# Patient Record
Sex: Female | Born: 1975
Health system: Southern US, Community
[De-identification: ages and names within clinical notes are randomized; demographics above are authoritative.]

## PROBLEM LIST (undated history)

## (undated) DIAGNOSIS — N83201 Unspecified ovarian cyst, right side: Secondary | ICD-10-CM

## (undated) DIAGNOSIS — J209 Acute bronchitis, unspecified: Secondary | ICD-10-CM

## (undated) DIAGNOSIS — G43909 Migraine, unspecified, not intractable, without status migrainosus: Secondary | ICD-10-CM

## (undated) DIAGNOSIS — Z9289 Personal history of other medical treatment: Secondary | ICD-10-CM

## (undated) DIAGNOSIS — F3131 Bipolar disorder, current episode depressed, mild: Secondary | ICD-10-CM

## (undated) DIAGNOSIS — N83202 Unspecified ovarian cyst, left side: Secondary | ICD-10-CM

## (undated) DIAGNOSIS — I10 Essential (primary) hypertension: Secondary | ICD-10-CM

## (undated) DIAGNOSIS — D649 Anemia, unspecified: Secondary | ICD-10-CM

## (undated) DIAGNOSIS — O139 Gestational [pregnancy-induced] hypertension without significant proteinuria, unspecified trimester: Secondary | ICD-10-CM

## (undated) DIAGNOSIS — K219 Gastro-esophageal reflux disease without esophagitis: Secondary | ICD-10-CM

## (undated) DIAGNOSIS — D219 Benign neoplasm of connective and other soft tissue, unspecified: Secondary | ICD-10-CM

## (undated) DIAGNOSIS — R569 Unspecified convulsions: Secondary | ICD-10-CM

## (undated) HISTORY — PX: INDUCED ABORTION: SHX677

## (undated) HISTORY — PX: TUBAL LIGATION: SHX77

## (undated) HISTORY — DX: Benign neoplasm of connective and other soft tissue, unspecified: D21.9

## (undated) HISTORY — DX: Acute bronchitis, unspecified: J20.9

---
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
## 1998-06-08 ENCOUNTER — Emergency Department (HOSPITAL_COMMUNITY): Admission: EM | Admit: 1998-06-08 | Discharge: 1998-06-08 | Payer: Self-pay | Admitting: Emergency Medicine

## 1998-06-12 ENCOUNTER — Emergency Department (HOSPITAL_COMMUNITY): Admission: EM | Admit: 1998-06-12 | Discharge: 1998-06-12 | Payer: Self-pay | Admitting: *Deleted

## 1998-12-17 ENCOUNTER — Emergency Department (HOSPITAL_COMMUNITY): Admission: EM | Admit: 1998-12-17 | Discharge: 1998-12-17 | Payer: Self-pay

## 1999-06-06 ENCOUNTER — Emergency Department (HOSPITAL_COMMUNITY): Admission: EM | Admit: 1999-06-06 | Discharge: 1999-06-06 | Payer: Self-pay | Admitting: Emergency Medicine

## 1999-08-13 ENCOUNTER — Emergency Department (HOSPITAL_COMMUNITY): Admission: EM | Admit: 1999-08-13 | Discharge: 1999-08-14 | Payer: Self-pay | Admitting: Emergency Medicine

## 1999-08-13 ENCOUNTER — Encounter: Payer: Self-pay | Admitting: Emergency Medicine

## 2000-01-18 ENCOUNTER — Emergency Department (HOSPITAL_COMMUNITY): Admission: EM | Admit: 2000-01-18 | Discharge: 2000-01-18 | Payer: Self-pay | Admitting: Emergency Medicine

## 2000-01-18 ENCOUNTER — Encounter: Payer: Self-pay | Admitting: *Deleted

## 2000-01-20 ENCOUNTER — Emergency Department (HOSPITAL_COMMUNITY): Admission: EM | Admit: 2000-01-20 | Discharge: 2000-01-21 | Payer: Self-pay | Admitting: Emergency Medicine

## 2001-05-17 ENCOUNTER — Encounter: Admission: RE | Admit: 2001-05-17 | Discharge: 2001-05-17 | Payer: Self-pay | Admitting: General Practice

## 2001-05-17 ENCOUNTER — Encounter: Payer: Self-pay | Admitting: General Practice

## 2002-04-09 ENCOUNTER — Other Ambulatory Visit: Admission: RE | Admit: 2002-04-09 | Discharge: 2002-04-09 | Payer: Self-pay | Admitting: Obstetrics and Gynecology

## 2002-07-15 ENCOUNTER — Encounter: Admission: RE | Admit: 2002-07-15 | Discharge: 2002-07-15 | Payer: Self-pay | Admitting: General Practice

## 2002-07-15 ENCOUNTER — Encounter: Payer: Self-pay | Admitting: General Practice

## 2003-12-21 ENCOUNTER — Emergency Department (HOSPITAL_COMMUNITY): Admission: EM | Admit: 2003-12-21 | Discharge: 2003-12-21 | Payer: Self-pay | Admitting: Emergency Medicine

## 2004-12-01 ENCOUNTER — Inpatient Hospital Stay (HOSPITAL_COMMUNITY): Admission: AD | Admit: 2004-12-01 | Discharge: 2004-12-02 | Payer: Self-pay | Admitting: Obstetrics and Gynecology

## 2005-05-29 ENCOUNTER — Inpatient Hospital Stay (HOSPITAL_COMMUNITY): Admission: AD | Admit: 2005-05-29 | Discharge: 2005-05-29 | Payer: Self-pay | Admitting: *Deleted

## 2005-12-20 ENCOUNTER — Inpatient Hospital Stay (HOSPITAL_COMMUNITY): Admission: AD | Admit: 2005-12-20 | Discharge: 2005-12-20 | Payer: Self-pay | Admitting: *Deleted

## 2006-02-03 ENCOUNTER — Emergency Department (HOSPITAL_COMMUNITY): Admission: EM | Admit: 2006-02-03 | Discharge: 2006-02-03 | Payer: Self-pay | Admitting: Emergency Medicine

## 2006-02-06 ENCOUNTER — Emergency Department (HOSPITAL_COMMUNITY): Admission: EM | Admit: 2006-02-06 | Discharge: 2006-02-06 | Payer: Self-pay | Admitting: Emergency Medicine

## 2006-08-05 ENCOUNTER — Emergency Department (HOSPITAL_COMMUNITY): Admission: EM | Admit: 2006-08-05 | Discharge: 2006-08-05 | Payer: Self-pay | Admitting: Emergency Medicine

## 2006-12-21 ENCOUNTER — Emergency Department (HOSPITAL_COMMUNITY): Admission: EM | Admit: 2006-12-21 | Discharge: 2006-12-21 | Payer: Self-pay | Admitting: Emergency Medicine

## 2008-02-22 ENCOUNTER — Emergency Department (HOSPITAL_COMMUNITY): Admission: EM | Admit: 2008-02-22 | Discharge: 2008-02-22 | Payer: Self-pay | Admitting: Emergency Medicine

## 2008-06-05 ENCOUNTER — Emergency Department (HOSPITAL_COMMUNITY): Admission: EM | Admit: 2008-06-05 | Discharge: 2008-06-05 | Payer: Self-pay | Admitting: Emergency Medicine

## 2008-07-04 ENCOUNTER — Emergency Department (HOSPITAL_COMMUNITY): Admission: EM | Admit: 2008-07-04 | Discharge: 2008-07-04 | Payer: Self-pay | Admitting: Emergency Medicine

## 2008-07-16 ENCOUNTER — Emergency Department (HOSPITAL_COMMUNITY): Admission: EM | Admit: 2008-07-16 | Discharge: 2008-07-16 | Payer: Self-pay | Admitting: Emergency Medicine

## 2008-09-04 ENCOUNTER — Emergency Department (HOSPITAL_COMMUNITY): Admission: EM | Admit: 2008-09-04 | Discharge: 2008-09-04 | Payer: Self-pay | Admitting: Emergency Medicine

## 2008-12-22 ENCOUNTER — Emergency Department (HOSPITAL_COMMUNITY): Admission: EM | Admit: 2008-12-22 | Discharge: 2008-12-22 | Payer: Self-pay | Admitting: Emergency Medicine

## 2009-03-03 ENCOUNTER — Emergency Department (HOSPITAL_COMMUNITY): Admission: EM | Admit: 2009-03-03 | Discharge: 2009-03-03 | Payer: Self-pay | Admitting: Cardiology

## 2009-04-22 ENCOUNTER — Ambulatory Visit: Payer: Self-pay | Admitting: Physician Assistant

## 2009-04-22 ENCOUNTER — Inpatient Hospital Stay (HOSPITAL_COMMUNITY): Admission: AD | Admit: 2009-04-22 | Discharge: 2009-04-22 | Payer: Self-pay | Admitting: Obstetrics and Gynecology

## 2009-05-21 ENCOUNTER — Ambulatory Visit (HOSPITAL_COMMUNITY): Admission: RE | Admit: 2009-05-21 | Discharge: 2009-05-21 | Payer: Self-pay | Admitting: Family Medicine

## 2009-05-21 ENCOUNTER — Ambulatory Visit: Payer: Self-pay | Admitting: Obstetrics and Gynecology

## 2009-05-21 ENCOUNTER — Encounter: Payer: Self-pay | Admitting: Obstetrics and Gynecology

## 2009-05-21 LAB — CONVERTED CEMR LAB
HCT: 43.2 % (ref 36.0–46.0)
MCHC: 33.6 g/dL (ref 30.0–36.0)
MCV: 92.7 fL (ref 78.0–100.0)
Platelets: 433 10*3/uL — ABNORMAL HIGH (ref 150–400)
RDW: 12 % (ref 11.5–15.5)

## 2009-05-24 ENCOUNTER — Emergency Department (HOSPITAL_COMMUNITY): Admission: EM | Admit: 2009-05-24 | Discharge: 2009-05-24 | Payer: Self-pay | Admitting: Emergency Medicine

## 2009-07-12 ENCOUNTER — Emergency Department (HOSPITAL_COMMUNITY): Admission: EM | Admit: 2009-07-12 | Discharge: 2009-07-12 | Payer: Self-pay | Admitting: Emergency Medicine

## 2009-08-06 ENCOUNTER — Ambulatory Visit: Payer: Self-pay | Admitting: Obstetrics and Gynecology

## 2009-11-05 ENCOUNTER — Ambulatory Visit: Payer: Self-pay | Admitting: Obstetrics and Gynecology

## 2009-12-24 ENCOUNTER — Ambulatory Visit: Payer: Self-pay | Admitting: Obstetrics and Gynecology

## 2010-02-03 ENCOUNTER — Emergency Department (HOSPITAL_COMMUNITY): Admission: EM | Admit: 2010-02-03 | Discharge: 2010-02-03 | Payer: Self-pay | Admitting: Emergency Medicine

## 2010-06-16 ENCOUNTER — Emergency Department (HOSPITAL_COMMUNITY): Admission: EM | Admit: 2010-06-16 | Discharge: 2010-06-16 | Payer: Self-pay | Admitting: Emergency Medicine

## 2010-09-26 ENCOUNTER — Emergency Department (HOSPITAL_COMMUNITY): Admission: EM | Admit: 2010-09-26 | Discharge: 2010-09-26 | Payer: Self-pay | Admitting: Emergency Medicine

## 2011-01-03 ENCOUNTER — Ambulatory Visit: Admit: 2011-01-03 | Payer: Self-pay | Admitting: Obstetrics & Gynecology

## 2011-01-10 ENCOUNTER — Encounter: Payer: Self-pay | Admitting: *Deleted

## 2011-02-08 ENCOUNTER — Inpatient Hospital Stay (HOSPITAL_COMMUNITY): Payer: Medicaid Other

## 2011-02-08 ENCOUNTER — Inpatient Hospital Stay (HOSPITAL_COMMUNITY)
Admission: AD | Admit: 2011-02-08 | Discharge: 2011-02-08 | Disposition: A | Discharge status: Home / Self Care | Payer: Medicaid Other | Source: Ambulatory Visit | Attending: Obstetrics & Gynecology | Admitting: Obstetrics & Gynecology

## 2011-02-08 DIAGNOSIS — R109 Unspecified abdominal pain: Secondary | ICD-10-CM | POA: Insufficient documentation

## 2011-02-08 DIAGNOSIS — N938 Other specified abnormal uterine and vaginal bleeding: Secondary | ICD-10-CM | POA: Insufficient documentation

## 2011-02-08 DIAGNOSIS — N949 Unspecified condition associated with female genital organs and menstrual cycle: Secondary | ICD-10-CM | POA: Insufficient documentation

## 2011-02-08 LAB — URINALYSIS, ROUTINE W REFLEX MICROSCOPIC
Bilirubin Urine: NEGATIVE
Leukocytes, UA: NEGATIVE
Nitrite: NEGATIVE

## 2011-02-08 LAB — CBC
HCT: 38.4 % (ref 36.0–46.0)
Hemoglobin: 12.8 g/dL (ref 12.0–15.0)
MCH: 31.6 pg (ref 26.0–34.0)
MCHC: 33.3 g/dL (ref 30.0–36.0)
MCV: 94.8 fL (ref 78.0–100.0)

## 2011-02-08 LAB — URINE MICROSCOPIC-ADD ON

## 2011-02-08 LAB — WET PREP, GENITAL

## 2011-02-09 LAB — GC/CHLAMYDIA PROBE AMP, GENITAL: GC Probe Amp, Genital: NEGATIVE

## 2011-02-24 ENCOUNTER — Ambulatory Visit: Payer: Self-pay | Admitting: Obstetrics & Gynecology

## 2011-03-03 LAB — DIFFERENTIAL
Basophils Relative: 1 % (ref 0–1)
Eosinophils Absolute: 0.5 10*3/uL (ref 0.0–0.7)
Monocytes Relative: 7 % (ref 3–12)
Neutrophils Relative %: 60 % (ref 43–77)

## 2011-03-03 LAB — CBC
Hemoglobin: 13.7 g/dL (ref 12.0–15.0)
MCH: 32.8 pg (ref 26.0–34.0)
MCHC: 34.1 g/dL (ref 30.0–36.0)
RDW: 12.6 % (ref 11.5–15.5)

## 2011-03-03 LAB — POCT I-STAT, CHEM 8
Calcium, Ion: 1.13 mmol/L (ref 1.12–1.32)
Glucose, Bld: 84 mg/dL (ref 70–99)
HCT: 43 % (ref 36.0–46.0)
Hemoglobin: 14.6 g/dL (ref 12.0–15.0)
Potassium: 3.9 mEq/L (ref 3.5–5.1)

## 2011-03-16 ENCOUNTER — Inpatient Hospital Stay (HOSPITAL_COMMUNITY): Payer: Medicaid Other

## 2011-03-16 ENCOUNTER — Inpatient Hospital Stay (HOSPITAL_COMMUNITY)
Admission: AD | Admit: 2011-03-16 | Discharge: 2011-03-16 | Disposition: A | Discharge status: Home / Self Care | Payer: Medicaid Other | Source: Ambulatory Visit | Attending: Obstetrics and Gynecology | Admitting: Obstetrics and Gynecology

## 2011-03-16 DIAGNOSIS — R109 Unspecified abdominal pain: Secondary | ICD-10-CM | POA: Insufficient documentation

## 2011-03-16 DIAGNOSIS — N949 Unspecified condition associated with female genital organs and menstrual cycle: Secondary | ICD-10-CM

## 2011-03-16 DIAGNOSIS — N390 Urinary tract infection, site not specified: Secondary | ICD-10-CM

## 2011-03-16 DIAGNOSIS — R52 Pain, unspecified: Secondary | ICD-10-CM

## 2011-03-16 DIAGNOSIS — N938 Other specified abnormal uterine and vaginal bleeding: Secondary | ICD-10-CM | POA: Insufficient documentation

## 2011-03-16 LAB — CBC
Hemoglobin: 14.1 g/dL (ref 12.0–15.0)
MCH: 32 pg (ref 26.0–34.0)
MCV: 94.3 fL (ref 78.0–100.0)
RBC: 4.4 MIL/uL (ref 3.87–5.11)

## 2011-03-16 LAB — DIFFERENTIAL
Lymphocytes Relative: 15 % (ref 12–46)
Lymphs Abs: 1 10*3/uL (ref 0.7–4.0)
Monocytes Relative: 1 % — ABNORMAL LOW (ref 3–12)
Neutro Abs: 5.2 10*3/uL (ref 1.7–7.7)
Neutrophils Relative %: 83 % — ABNORMAL HIGH (ref 43–77)

## 2011-03-16 LAB — URINALYSIS, ROUTINE W REFLEX MICROSCOPIC
Protein, ur: >300 mg/dL — AB
Urobilinogen, UA: 2 mg/dL — ABNORMAL HIGH (ref 0.0–1.0)

## 2011-03-17 LAB — URINE CULTURE: Colony Count: 15000

## 2011-03-28 ENCOUNTER — Encounter: Payer: Self-pay | Admitting: Obstetrics & Gynecology

## 2011-03-29 LAB — WET PREP, GENITAL
Trich, Wet Prep: NONE SEEN
Yeast Wet Prep HPF POC: NONE SEEN

## 2011-03-31 LAB — URINALYSIS, ROUTINE W REFLEX MICROSCOPIC
Bilirubin Urine: NEGATIVE
Glucose, UA: NEGATIVE mg/dL
Hgb urine dipstick: NEGATIVE
Protein, ur: NEGATIVE mg/dL
Urobilinogen, UA: 0.2 mg/dL (ref 0.0–1.0)

## 2011-03-31 LAB — DIFFERENTIAL
Basophils Absolute: 0 10*3/uL (ref 0.0–0.1)
Basophils Relative: 1 % (ref 0–1)
Eosinophils Absolute: 0.6 10*3/uL (ref 0.0–0.7)
Eosinophils Relative: 8 % — ABNORMAL HIGH (ref 0–5)
Monocytes Absolute: 0.5 10*3/uL (ref 0.1–1.0)
Neutro Abs: 3.9 10*3/uL (ref 1.7–7.7)

## 2011-03-31 LAB — BASIC METABOLIC PANEL
CO2: 26 mEq/L (ref 19–32)
Calcium: 9.5 mg/dL (ref 8.4–10.5)
Chloride: 106 mEq/L (ref 96–112)
Glucose, Bld: 90 mg/dL (ref 70–99)
Sodium: 138 mEq/L (ref 135–145)

## 2011-03-31 LAB — CBC
Hemoglobin: 14.4 g/dL (ref 12.0–15.0)
MCHC: 33.5 g/dL (ref 30.0–36.0)
MCV: 96.6 fL (ref 78.0–100.0)
RDW: 12.7 % (ref 11.5–15.5)

## 2011-05-03 NOTE — Group Therapy Note (Signed)
[name], [name] NO.:  [id number]   MEDICAL RECORD NO.:  [id number]          PATIENT TYPE:  WOC   LOCATION:  [facility]                   FACILITY:  [facility]   PHYSICIAN:  Argentina Donovan, MD        DATE OF BIRTH:  1976-10-13   DATE OF SERVICE:  05/21/2009                                  CLINIC NOTE   The patient is a 35 year old African American female gravida 2, para 2-0-  0-2 who had 2 cesarean sections in 1994 and 1996 and had a tubal  ligation following that.  Prior to her tubal ligation, she was using  Depo-Provera and oral contraceptives.  Since her tubal ligation, her  periods have gotten much heavier and over the last 2 years much more  uncomfortable with severe lower abdominal pain radiating to the back,  upper thighs, and not helped much by ibuprofen.  When she does take it,  there is a problem because she has started to have stomach problems from  it.  They had tried her on oral contraceptives without success.  I have  told her what we would start off doing, as she sounds like she probably  has some adenomyosis and see if we can control this.  One, trying her on  some Depo-Provera.  If that does not work, we can try an endometrial  ablation.  We talked to her about the NovaSure and gave her information  on that and thirdly if that does not work, hysterectomy is last resort.  She is going to get a followup in mid July on an ultrasound because the  last one she had done a few days ago showed a small complex cyst and  they requested a followup on that.  It looks like, however, a small  hemorrhagic functional cyst.  The patient, otherwise, is in good health.  The patient had a Pap smear in 2 years, so that will be done today.   MEDICATION:  She is taking lisinopril and Ventolin.   She has no known allergies.   PHYSICAL EXAMINATION:  Today, her blood pressure is 112/77, her weight  is 166.  She is 60 inches tall.  Well-developed and slightly obese  African American female.  Abdomen is soft and nontender.  No masses or  organomegaly.  External genitalia is normal.  BUS within normal limits.  Vagina is clean and well rugated.  Cervix, clean, parous.  Uterus,  anterior normal size, shape, and consistency.   IMPRESSION:  Normal physical examination with menorrhagia and severe  dysmenorrhea, probable adenomyosis.   PLAN:  Depo-Provera, if not successful endometrial ablation, if not  successful hysterectomy.  CBC will be obtained to make sure that she is  not getting anemic from a lot of bleeding she is doing.           ______________________________  Argentina Donovan, MD     PR/MEDQ  D:  05/21/2009  T:  05/22/2009  Job:  [id number]

## 2011-06-23 ENCOUNTER — Emergency Department (HOSPITAL_COMMUNITY): Payer: Medicaid Other

## 2011-06-23 ENCOUNTER — Emergency Department (HOSPITAL_COMMUNITY)
Admission: EM | Admit: 2011-06-23 | Discharge: 2011-06-24 | Disposition: A | Discharge status: Home / Self Care | Payer: Medicaid Other | Attending: Emergency Medicine | Admitting: Emergency Medicine

## 2011-06-23 DIAGNOSIS — K649 Unspecified hemorrhoids: Secondary | ICD-10-CM | POA: Insufficient documentation

## 2011-06-23 DIAGNOSIS — Z79899 Other long term (current) drug therapy: Secondary | ICD-10-CM | POA: Insufficient documentation

## 2011-06-23 DIAGNOSIS — G44209 Tension-type headache, unspecified, not intractable: Secondary | ICD-10-CM | POA: Insufficient documentation

## 2011-06-23 LAB — POCT PREGNANCY, URINE: Preg Test, Ur: NEGATIVE

## 2011-06-23 LAB — CBC
MCH: 32.8 pg (ref 26.0–34.0)
MCHC: 35.2 g/dL (ref 30.0–36.0)
MCV: 93.1 fL (ref 78.0–100.0)
Platelets: 324 10*3/uL (ref 150–400)

## 2011-06-23 LAB — DIFFERENTIAL
Basophils Relative: 1 % (ref 0–1)
Eosinophils Absolute: 0.7 10*3/uL (ref 0.0–0.7)
Eosinophils Relative: 10 % — ABNORMAL HIGH (ref 0–5)
Lymphs Abs: 4 10*3/uL (ref 0.7–4.0)
Monocytes Absolute: 0.5 10*3/uL (ref 0.1–1.0)
Monocytes Relative: 6 % (ref 3–12)

## 2011-06-23 LAB — BASIC METABOLIC PANEL
CO2: 26 mEq/L (ref 19–32)
Calcium: 9.9 mg/dL (ref 8.4–10.5)
Creatinine, Ser: 0.71 mg/dL (ref 0.50–1.10)
GFR calc non Af Amer: >60 mL/min (ref >60)
Sodium: 138 mEq/L (ref 135–145)

## 2011-06-24 LAB — URINALYSIS, ROUTINE W REFLEX MICROSCOPIC
Bilirubin Urine: NEGATIVE
Glucose, UA: NEGATIVE mg/dL
Ketones, ur: NEGATIVE mg/dL
Leukocytes, UA: NEGATIVE
Protein, ur: NEGATIVE mg/dL
pH: 6 (ref 5.0–8.0)

## 2011-07-08 ENCOUNTER — Emergency Department (HOSPITAL_COMMUNITY)
Admission: EM | Admit: 2011-07-08 | Discharge: 2011-07-08 | Disposition: A | Discharge status: Home / Self Care | Payer: Medicaid Other | Attending: Emergency Medicine | Admitting: Emergency Medicine

## 2011-07-08 DIAGNOSIS — R0602 Shortness of breath: Secondary | ICD-10-CM | POA: Insufficient documentation

## 2011-07-08 DIAGNOSIS — J45909 Unspecified asthma, uncomplicated: Secondary | ICD-10-CM | POA: Insufficient documentation

## 2011-07-23 ENCOUNTER — Inpatient Hospital Stay (HOSPITAL_COMMUNITY)
Admission: AD | Admit: 2011-07-23 | Discharge: 2011-07-23 | Disposition: A | Discharge status: Home / Self Care | Payer: Medicaid Other | Source: Ambulatory Visit | Attending: Obstetrics & Gynecology | Admitting: Obstetrics & Gynecology

## 2011-07-23 ENCOUNTER — Encounter (HOSPITAL_COMMUNITY): Payer: Self-pay | Admitting: *Deleted

## 2011-07-23 DIAGNOSIS — R109 Unspecified abdominal pain: Secondary | ICD-10-CM | POA: Insufficient documentation

## 2011-07-23 HISTORY — DX: Bipolar disorder, current episode depressed, mild: F31.31

## 2011-07-23 HISTORY — DX: Unspecified ovarian cyst, left side: N83.202

## 2011-07-23 HISTORY — DX: Gestational (pregnancy-induced) hypertension without significant proteinuria, unspecified trimester: O13.9

## 2011-07-23 HISTORY — DX: Gastro-esophageal reflux disease without esophagitis: K21.9

## 2011-07-23 HISTORY — DX: Essential (primary) hypertension: I10

## 2011-07-23 HISTORY — DX: Migraine, unspecified, not intractable, without status migrainosus: G43.909

## 2011-07-23 HISTORY — DX: Unspecified ovarian cyst, right side: N83.201

## 2011-07-23 LAB — URINALYSIS, ROUTINE W REFLEX MICROSCOPIC
Glucose, UA: NEGATIVE mg/dL
Ketones, ur: NEGATIVE mg/dL
Leukocytes, UA: NEGATIVE
Protein, ur: NEGATIVE mg/dL
Urobilinogen, UA: 0.2 mg/dL (ref 0.0–1.0)

## 2011-07-23 LAB — WET PREP, GENITAL: Yeast Wet Prep HPF POC: NONE SEEN

## 2011-07-23 NOTE — Progress Notes (Signed)
Two wks ago pt noticed a spot of blood in her partner's semen.  Noticed it again last night, has been having "cervical cramping" for 2 wks.  Partner has not been tested for STD.

## 2011-07-23 NOTE — Progress Notes (Signed)
Pt states, " Last week there was blood in my boyfriends semen, and I am worried that he may have an STD and given it to me because my stomache feels bloated and has butterflies."

## 2011-07-23 NOTE — Progress Notes (Signed)
Pt added that she has had frequency on urination X 3-4 days with mild cramping in low abdomen."

## 2011-07-23 NOTE — Progress Notes (Signed)
By Lilyan Punt NP

## 2011-07-23 NOTE — ED Provider Notes (Signed)
History     Chief Complaint  Patient presents with  . Bloated  . Abdominal Cramping  . Dysuria   HPI 35 y.o.  Having fluttering feeling in abdomen.  Noticed a drop of blood in partner's semen when he pulled out last night.  Since she has been feeling strange, wanted to be checked for STDs.  Having some cramping and thought menses was coming early but has not started menses.  Thought she might have a UTI.    OB History    Grav Para Term Preterm Abortions TAB SAB Ect Mult Living   3 2 1 1 1 1    2       Past Medical History  Diagnosis Date  . Bipolar 1 disorder, depressed, mild   . Migraines   . GERD (gastroesophageal reflux disease)   . Bilateral ovarian cysts   . Asthma   . Pregnancy induced hypertension   . HTN (hypertension)     Past Surgical History  Procedure Date  . Cesarean section   . Induced abortion   . Tubal ligation     No family history on file.  History  Substance Use Topics  . Smoking status: Current Everyday Smoker -- 0.5 packs/day    Types: Cigarettes  . Smokeless tobacco: Never Used  . Alcohol Use: Yes     every other week    Allergies:  Allergies  Allergen Reactions  . Prednisone Swelling and Rash    Leg swells up    Prescriptions prior to admission  Medication Sig Dispense Refill  . albuterol (PROVENTIL,VENTOLIN) 90 MCG/ACT inhaler Inhale 2 puffs into the lungs every 6 (six) hours as needed. Shortness of breath       . Butalbital-Acetaminophen (BUTALBITAL-APAP PO) Take 1 tablet by mouth daily as needed. headaches         Review of Systems  Gastrointestinal: Positive for abdominal pain.   Physical Exam   Blood pressure 121/80, pulse 99, temperature 99.7 F (37.6 C), temperature source Oral, resp. rate 16, height 5' 0.75" (1.543 m), weight 149 lb 6 oz (67.756 kg), last menstrual period 07/03/2011.  Physical Exam  Nursing note and vitals reviewed. Constitutional: She is oriented to person, place, and time. She appears well-developed  and well-nourished.  HENT:  Head: Normocephalic.  Eyes: EOM are normal.  Neck: Neck supple.  GI: Soft. There is no tenderness. There is no rebound and no guarding.  Genitourinary:       Speculum exam: Vagina - Small amount of creamy discharge, no odor Cervix - No contact bleeding Bimanual exam: Cervix closed Uterus non tender, normal size Adnexa non tender, no masses bilaterally GC/Chlam, wet prep done Chaperone present for exam.    Musculoskeletal: Normal range of motion.  Neurological: She is alert and oriented to person, place, and time.  Skin: Skin is warm and dry.  Psychiatric: She has a normal mood and affect.    MAU Course  Procedures  MDM Results for orders placed during the hospital encounter of 07/23/11 (from the past 24 hour(s))  URINALYSIS, ROUTINE W REFLEX MICROSCOPIC     Status: Normal   Collection Time   07/23/11  9:05 PM      Component Value Range   Color, Urine YELLOW  YELLOW    Appearance CLEAR  CLEAR    Specific Gravity, Urine 1.020  1.005 - 1.030    pH 6.0  5.0 - 8.0    Glucose, UA NEGATIVE  NEGATIVE (mg/dL)   Hgb urine dipstick  NEGATIVE  NEGATIVE    Bilirubin Urine NEGATIVE  NEGATIVE    Ketones, ur NEGATIVE  NEGATIVE (mg/dL)   Protein, ur NEGATIVE  NEGATIVE (mg/dL)   Urobilinogen, UA 0.2  0.0 - 1.0 (mg/dL)   Nitrite NEGATIVE  NEGATIVE    Leukocytes, UA NEGATIVE  NEGATIVE   POCT PREGNANCY, URINE     Status: Normal   Collection Time   07/23/11  9:12 PM      Component Value Range   Preg Test, Ur NEGATIVE    WET PREP, GENITAL     Status: Abnormal   Collection Time   07/23/11 10:30 PM      Component Value Range   Yeast, Wet Prep NONE SEEN  NONE SEEN    Trich, Wet Prep NONE SEEN  NONE SEEN    Clue Cells, Wet Prep NONE SEEN  NONE SEEN    WBC, Wet Prep HPF POC FEW (*) NONE SEEN      Assessment and Plan  Abdominal cramping  Plan: Take over the counter Ibuprofen by the package directions for pain. Labs are pending. Seek additional medical care  if your symptoms worsen. Have your partner seen for medical care if the blood in his semen continues.   Crytal Pensinger 07/23/2011, 10:29 PM   Nolene Bernheim, NP 07/23/11 [phone]

## 2011-10-31 ENCOUNTER — Emergency Department (HOSPITAL_COMMUNITY): Payer: Self-pay

## 2011-10-31 ENCOUNTER — Emergency Department (HOSPITAL_COMMUNITY)
Admission: EM | Admit: 2011-10-31 | Discharge: 2011-10-31 | Disposition: A | Discharge status: Home / Self Care | Payer: Self-pay | Attending: Emergency Medicine | Admitting: Emergency Medicine

## 2011-10-31 ENCOUNTER — Encounter (HOSPITAL_COMMUNITY): Payer: Self-pay

## 2011-10-31 DIAGNOSIS — R634 Abnormal weight loss: Secondary | ICD-10-CM | POA: Insufficient documentation

## 2011-10-31 DIAGNOSIS — R630 Anorexia: Secondary | ICD-10-CM | POA: Insufficient documentation

## 2011-10-31 DIAGNOSIS — I10 Essential (primary) hypertension: Secondary | ICD-10-CM | POA: Insufficient documentation

## 2011-10-31 DIAGNOSIS — R61 Generalized hyperhidrosis: Secondary | ICD-10-CM | POA: Insufficient documentation

## 2011-10-31 DIAGNOSIS — R112 Nausea with vomiting, unspecified: Secondary | ICD-10-CM | POA: Insufficient documentation

## 2011-10-31 DIAGNOSIS — R0982 Postnasal drip: Secondary | ICD-10-CM | POA: Insufficient documentation

## 2011-10-31 DIAGNOSIS — R5381 Other malaise: Secondary | ICD-10-CM | POA: Insufficient documentation

## 2011-10-31 DIAGNOSIS — R6883 Chills (without fever): Secondary | ICD-10-CM | POA: Insufficient documentation

## 2011-10-31 DIAGNOSIS — H9209 Otalgia, unspecified ear: Secondary | ICD-10-CM | POA: Insufficient documentation

## 2011-10-31 DIAGNOSIS — B9789 Other viral agents as the cause of diseases classified elsewhere: Secondary | ICD-10-CM | POA: Insufficient documentation

## 2011-10-31 DIAGNOSIS — K219 Gastro-esophageal reflux disease without esophagitis: Secondary | ICD-10-CM | POA: Insufficient documentation

## 2011-10-31 DIAGNOSIS — F3131 Bipolar disorder, current episode depressed, mild: Secondary | ICD-10-CM | POA: Insufficient documentation

## 2011-10-31 DIAGNOSIS — IMO0001 Reserved for inherently not codable concepts without codable children: Secondary | ICD-10-CM | POA: Insufficient documentation

## 2011-10-31 DIAGNOSIS — B349 Viral infection, unspecified: Secondary | ICD-10-CM

## 2011-10-31 DIAGNOSIS — Z79899 Other long term (current) drug therapy: Secondary | ICD-10-CM | POA: Insufficient documentation

## 2011-10-31 DIAGNOSIS — J029 Acute pharyngitis, unspecified: Secondary | ICD-10-CM | POA: Insufficient documentation

## 2011-10-31 DIAGNOSIS — J45909 Unspecified asthma, uncomplicated: Secondary | ICD-10-CM | POA: Insufficient documentation

## 2011-10-31 DIAGNOSIS — R599 Enlarged lymph nodes, unspecified: Secondary | ICD-10-CM | POA: Insufficient documentation

## 2011-10-31 DIAGNOSIS — J3489 Other specified disorders of nose and nasal sinuses: Secondary | ICD-10-CM | POA: Insufficient documentation

## 2011-10-31 LAB — CBC
Hemoglobin: 12.4 g/dL (ref 12.0–15.0)
MCHC: 34.3 g/dL (ref 30.0–36.0)
RDW: 12.1 % (ref 11.5–15.5)
WBC: 5.6 10*3/uL (ref 4.0–10.5)

## 2011-10-31 LAB — DIFFERENTIAL
Basophils Absolute: 0.1 10*3/uL (ref 0.0–0.1)
Basophils Relative: 1 % (ref 0–1)
Monocytes Relative: 11 % (ref 3–12)
Neutro Abs: 3.4 10*3/uL (ref 1.7–7.7)
Neutrophils Relative %: 60 % (ref 43–77)

## 2011-10-31 LAB — URINE MICROSCOPIC-ADD ON

## 2011-10-31 LAB — COMPREHENSIVE METABOLIC PANEL
AST: 15 U/L (ref 0–37)
Albumin: 3.5 g/dL (ref 3.5–5.2)
Alkaline Phosphatase: 49 U/L (ref 39–117)
Chloride: 105 mEq/L (ref 96–112)
Potassium: 4 mEq/L (ref 3.5–5.1)
Total Bilirubin: 0.3 mg/dL (ref 0.3–1.2)

## 2011-10-31 LAB — MONONUCLEOSIS SCREEN: Mono Screen: NEGATIVE

## 2011-10-31 LAB — URINALYSIS, ROUTINE W REFLEX MICROSCOPIC
Bilirubin Urine: NEGATIVE
Glucose, UA: NEGATIVE mg/dL
Ketones, ur: NEGATIVE mg/dL
pH: 6.5 (ref 5.0–8.0)

## 2011-10-31 LAB — RAPID STREP SCREEN (MED CTR MEBANE ONLY): Streptococcus, Group A Screen (Direct): NEGATIVE

## 2011-10-31 MED ORDER — ACETAMINOPHEN 325 MG PO TABS
650.0000 mg | ORAL_TABLET | Freq: Once | ORAL | Status: AC
  Administered 2011-10-31: 650 mg via ORAL
  Filled 2011-10-31: qty 1

## 2011-10-31 MED ORDER — ONDANSETRON 4 MG PO TBDP
8.0000 mg | ORAL_TABLET | Freq: Once | ORAL | Status: AC
  Administered 2011-10-31: 8 mg via ORAL
  Filled 2011-10-31: qty 1

## 2011-10-31 MED ORDER — HYDROCODONE-ACETAMINOPHEN 5-325 MG PO TABS: 2.0000 | ORAL_TABLET | ORAL | Status: AC | PRN

## 2011-10-31 MED ORDER — IBUPROFEN 800 MG PO TABS: 800.0000 mg | ORAL_TABLET | Freq: Three times a day (TID) | ORAL | Status: AC

## 2011-10-31 MED ORDER — ONDANSETRON 8 MG PO TBDP: 8.0000 mg | ORAL_TABLET | Freq: Three times a day (TID) | ORAL | Status: AC | PRN

## 2011-10-31 MED ORDER — DIPHENHYDRAMINE HCL 12.5 MG/5ML PO ELIX
25.0000 mg | ORAL_SOLUTION | Freq: Once | ORAL | Status: AC
  Administered 2011-10-31: 25 mg via ORAL
  Filled 2011-10-31: qty 10

## 2011-10-31 NOTE — ED Provider Notes (Signed)
History     CSN: [id number] Arrival date & time: 10/31/2011 11:19 AM   First MD Initiated Contact with Patient 10/31/11 1153      Chief Complaint  Patient presents with  . Generalized Body Aches  . Sore Throat  . Otalgia    (Consider location/radiation/quality/duration/timing/severity/associated sxs/prior treatment) HPI Comments: The patient is a 35 year old female who presents for evaluation of generalized body aches, sore throat, bilateral otalgia, anorexia, nausea, vomiting, and generalized weakness. She reports that over the last 3 weeks she has lost approximately 20 pounds of weight, but attributes this to "hardly eating anything at all". She does report ultimately feeling hot and cold and waking up in sweats. She denies any significant cough, denies chest pain, palpitations, abdominal pain, diarrhea, dysuria, or rash. She denies any headaches. While the weight loss has been over about the last 3 weeks with the decreased appetite, the other symptoms of generalized body aches, sore throat, otalgia, fatigue, sore throat nausea and vomiting, had been over the last 2-3 days. She is not aware of having had a fever.  Patient is a 35 y.o. female presenting with pharyngitis and ear pain. The history is provided by the patient.  Sore Throat This is a new problem. The problem occurs constantly. The problem has not changed since onset.Pertinent negatives include no chest pain, no abdominal pain, no headaches and no shortness of breath. The symptoms are aggravated by nothing.  Otalgia This is a new problem. There is pain in both ears. The problem occurs constantly. The problem has not changed since onset.There has been no fever. The pain is mild. Associated symptoms include rhinorrhea, sore throat and vomiting. Pertinent negatives include no ear discharge, no headaches, no hearing loss, no abdominal pain, no diarrhea, no neck pain, no cough and no rash.    Past Medical History  Diagnosis Date  .  Bipolar 1 disorder, depressed, mild   . Migraines   . GERD (gastroesophageal reflux disease)   . Bilateral ovarian cysts   . Asthma   . Pregnancy induced hypertension   . HTN (hypertension)     Past Surgical History  Procedure Date  . Cesarean section   . Induced abortion   . Tubal ligation     History reviewed. No pertinent family history.  History  Substance Use Topics  . Smoking status: Current Everyday Smoker -- 0.5 packs/day    Types: Cigarettes  . Smokeless tobacco: Never Used  . Alcohol Use: Yes     every other week    OB History    Grav Para Term Preterm Abortions TAB SAB Ect Mult Living   3 2 1 1 1 1    2       Review of Systems  Constitutional: Positive for chills, diaphoresis, appetite change, fatigue and unexpected weight change. Negative for fever and activity change.  HENT: Positive for ear pain, congestion, sore throat, rhinorrhea and postnasal drip. Negative for hearing loss, nosebleeds, facial swelling, sneezing, drooling, mouth sores, trouble swallowing, neck pain, neck stiffness, dental problem, voice change, sinus pressure, tinnitus and ear discharge.   Eyes: Negative for photophobia, pain, discharge, redness and itching.  Respiratory: Negative for cough, choking, chest tightness, shortness of breath, wheezing and stridor.   Cardiovascular: Negative for chest pain, palpitations and leg swelling.  Gastrointestinal: Positive for nausea and vomiting. Negative for abdominal pain, diarrhea, constipation, blood in stool, abdominal distention, anal bleeding and rectal pain.  Genitourinary: Negative for dysuria, urgency, frequency, hematuria, flank pain and difficulty urinating.  Musculoskeletal: Positive for myalgias. Negative for back pain, joint swelling, arthralgias and gait problem.  Skin: Negative for color change, pallor, rash and wound.  Neurological: Positive for weakness. Negative for dizziness, light-headedness, numbness and headaches.  Hematological:  Positive for adenopathy. Does not bruise/bleed easily.  Psychiatric/Behavioral: Negative.     Allergies  Prednisone  Home Medications   Current Outpatient Rx  Name Route Sig Dispense Refill  . ALBUTEROL 90 MCG/ACT IN AERS Inhalation Inhale 2 puffs into the lungs every 6 (six) hours as needed. Shortness of breath     . BUTALBITAL-APAP PO Oral Take 3 tablets by mouth daily as needed. headaches    . IBUPROFEN 200 MG PO TABS Oral Take 400 mg by mouth 3 (three) times daily as needed. For headaches       BP 124/81  Pulse 74  Temp(Src) 97.8 F (36.6 C) (Oral)  Resp 14  SpO2 96%  LMP 10/24/2011  Physical Exam  Nursing note and vitals reviewed. Constitutional: She is oriented to person, place, and time. She appears well-developed and well-nourished. No distress.  HENT:  Head: Normocephalic and atraumatic. No trismus in the jaw.  Right Ear: Tympanic membrane, external ear and ear canal normal. No mastoid tenderness.  Left Ear: External ear and ear canal normal. No mastoid tenderness. Tympanic membrane is erythematous and bulging.  Nose: Nose normal.  Mouth/Throat: Mucous membranes are normal. No oral lesions. No uvula swelling. Oropharyngeal exudate and posterior oropharyngeal erythema present. No posterior oropharyngeal edema or tonsillar abscesses.  Eyes: Conjunctivae and EOM are normal. Pupils are equal, round, and reactive to light. Right eye exhibits no discharge. Left eye exhibits no discharge.  Neck: Normal range of motion. Neck supple. No rigidity.  Cardiovascular: Normal rate, regular rhythm, normal heart sounds and intact distal pulses.  Exam reveals no gallop and no friction rub.   No murmur heard. Pulmonary/Chest: Effort normal and breath sounds normal. No respiratory distress. She has no wheezes. She has no rales.  Abdominal: Soft. Bowel sounds are normal.  Musculoskeletal: Normal range of motion. She exhibits no edema and no tenderness.  Lymphadenopathy:    She has  cervical adenopathy.       Right cervical: Superficial cervical adenopathy present.       Left cervical: Superficial cervical adenopathy present.  Neurological: She is alert and oriented to person, place, and time. No cranial nerve deficit.  Skin: Skin is warm and dry. No rash noted. No erythema.  Psychiatric: She has a normal mood and affect.    ED Course  Procedures (including critical care time)  Labs Reviewed  CBC - Abnormal; Notable for the following:    RBC 3.83 (*)    All other components within normal limits  DIFFERENTIAL - Abnormal; Notable for the following:    Eosinophils Relative 7 (*)    All other components within normal limits  MONONUCLEOSIS SCREEN  POCT RAPID STREP A  COMPREHENSIVE METABOLIC PANEL  LIPASE, BLOOD  URINALYSIS, ROUTINE W REFLEX MICROSCOPIC  POCT PREGNANCY, URINE   No results found.   No diagnosis found.    MDM  The patient's reported weight loss is her most concerning symptom to me, and makes me concerned for a malignancy, otherwise it may be explained by truly having a decreased appetite, and possibly her symptoms may be due to Epstein-Barr virus or mononucleosis. I have ordered a complete metabolic panel and a CBC with differential as well as a chest x-ray to evaluate for signs of malignancy, although none are  perceived on physical exam. She does appear to have an otitis media and pharyngitis, and likely will need antibiotic treatment on clinical basis. Followup with primary care is very important and I have stressed this to her for further evaluation of weight loss should it continue.        Felisa Bonier, MD 10/31/11 [phone]

## 2011-10-31 NOTE — ED Notes (Signed)
EDP at bedside  

## 2011-10-31 NOTE — ED Notes (Signed)
Pt reports 2 day hx of sore throat radiating into bilateral ears. Reports congestion without cough. Reports increase in sore throat with swallowing. Reports mild fever and chills at home.

## 2011-10-31 NOTE — ED Notes (Signed)
Pt reports only mild discomfort when swallowing now.

## 2011-10-31 NOTE — ED Notes (Signed)
Patient presents with generalized body aches, vomiting x 2, decrease in appetite and bilateral ear aches x 3 days.

## 2011-10-31 NOTE — ED Notes (Signed)
edp at bedside  

## 2011-11-16 ENCOUNTER — Emergency Department (HOSPITAL_COMMUNITY)
Admission: EM | Admit: 2011-11-16 | Discharge: 2011-11-16 | Disposition: A | Discharge status: Home / Self Care | Payer: Medicaid Other | Attending: Emergency Medicine | Admitting: Emergency Medicine

## 2011-11-16 DIAGNOSIS — F319 Bipolar disorder, unspecified: Secondary | ICD-10-CM | POA: Insufficient documentation

## 2011-11-16 DIAGNOSIS — Y92410 Unspecified street and highway as the place of occurrence of the external cause: Secondary | ICD-10-CM | POA: Insufficient documentation

## 2011-11-16 DIAGNOSIS — R51 Headache: Secondary | ICD-10-CM | POA: Insufficient documentation

## 2011-11-16 DIAGNOSIS — K219 Gastro-esophageal reflux disease without esophagitis: Secondary | ICD-10-CM | POA: Insufficient documentation

## 2011-11-16 DIAGNOSIS — M62838 Other muscle spasm: Secondary | ICD-10-CM | POA: Insufficient documentation

## 2011-11-16 DIAGNOSIS — J45909 Unspecified asthma, uncomplicated: Secondary | ICD-10-CM | POA: Insufficient documentation

## 2011-11-16 DIAGNOSIS — I10 Essential (primary) hypertension: Secondary | ICD-10-CM | POA: Insufficient documentation

## 2011-11-16 MED ORDER — DIAZEPAM 5 MG PO TABS
5.0000 mg | ORAL_TABLET | Freq: Once | ORAL | Status: AC
  Administered 2011-11-16: 5 mg via ORAL
  Filled 2011-11-16: qty 1

## 2011-11-16 MED ORDER — DIAZEPAM 5 MG PO TABS: 5.0000 mg | ORAL_TABLET | Freq: Four times a day (QID) | ORAL | Status: AC | PRN

## 2011-11-16 MED ORDER — KETOROLAC TROMETHAMINE 60 MG/2ML IM SOLN
60.0000 mg | Freq: Once | INTRAMUSCULAR | Status: AC
  Administered 2011-11-16: 60 mg via INTRAMUSCULAR

## 2011-11-16 MED ORDER — KETOROLAC TROMETHAMINE 60 MG/2ML IM SOLN
INTRAMUSCULAR | Status: AC
  Filled 2011-11-16: qty 2

## 2011-11-16 MED ORDER — IBUPROFEN 800 MG PO TABS: 800.0000 mg | ORAL_TABLET | Freq: Three times a day (TID) | ORAL | Status: AC

## 2011-11-16 MED ORDER — HYDROCODONE-ACETAMINOPHEN 5-500 MG PO TABS: 1.0000 | ORAL_TABLET | Freq: Four times a day (QID) | ORAL | Status: AC | PRN
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
## 2011-11-17 NOTE — ED Provider Notes (Signed)
Medical screening examination/treatment/procedure(s) were performed by non-physician practitioner and as supervising physician I was immediately available for consultation/collaboration.   Geoffery Lyons, MD 11/17/11 [phone]

## 2012-03-07 ENCOUNTER — Emergency Department (HOSPITAL_COMMUNITY)
Admission: EM | Admit: 2012-03-07 | Discharge: 2012-03-08 | Disposition: A | Discharge status: Home / Self Care | Payer: No Typology Code available for payment source | Attending: Emergency Medicine | Admitting: Emergency Medicine

## 2012-03-07 DIAGNOSIS — J45901 Unspecified asthma with (acute) exacerbation: Secondary | ICD-10-CM

## 2012-03-07 DIAGNOSIS — F17200 Nicotine dependence, unspecified, uncomplicated: Secondary | ICD-10-CM | POA: Insufficient documentation

## 2012-03-07 DIAGNOSIS — G43909 Migraine, unspecified, not intractable, without status migrainosus: Secondary | ICD-10-CM | POA: Insufficient documentation

## 2012-03-07 DIAGNOSIS — F3131 Bipolar disorder, current episode depressed, mild: Secondary | ICD-10-CM | POA: Insufficient documentation

## 2012-03-07 DIAGNOSIS — K219 Gastro-esophageal reflux disease without esophagitis: Secondary | ICD-10-CM | POA: Insufficient documentation

## 2012-03-07 DIAGNOSIS — R300 Dysuria: Secondary | ICD-10-CM

## 2012-03-07 DIAGNOSIS — J45909 Unspecified asthma, uncomplicated: Secondary | ICD-10-CM | POA: Insufficient documentation

## 2012-03-08 ENCOUNTER — Encounter (HOSPITAL_COMMUNITY): Payer: Self-pay | Admitting: Adult Health

## 2012-03-08 LAB — URINALYSIS, ROUTINE W REFLEX MICROSCOPIC
Bilirubin Urine: NEGATIVE
Ketones, ur: NEGATIVE mg/dL
Nitrite: NEGATIVE
Protein, ur: NEGATIVE mg/dL
Specific Gravity, Urine: 1.026 (ref 1.005–1.030)
Urobilinogen, UA: 1 mg/dL (ref 0.0–1.0)

## 2012-03-08 MED ORDER — ALBUTEROL SULFATE HFA 108 (90 BASE) MCG/ACT IN AERS
2.0000 | INHALATION_SPRAY | Freq: Once | RESPIRATORY_TRACT | Status: AC
  Administered 2012-03-08: 2 via RESPIRATORY_TRACT
  Filled 2012-03-08: qty 6.7

## 2012-03-08 MED ORDER — ALBUTEROL SULFATE (5 MG/ML) 0.5% IN NEBU
5.0000 mg | INHALATION_SOLUTION | Freq: Once | RESPIRATORY_TRACT | Status: AC
  Administered 2012-03-08: 5 mg via RESPIRATORY_TRACT
  Filled 2012-03-08: qty 1

## 2012-03-08 MED ORDER — IPRATROPIUM BROMIDE 0.02 % IN SOLN
RESPIRATORY_TRACT | Status: AC
  Administered 2012-03-08: 01:00:00
  Filled 2012-03-08: qty 2.5

## 2012-03-08 NOTE — Discharge Instructions (Signed)

## 2012-03-08 NOTE — ED Provider Notes (Signed)
History     CSN: [id number]  Arrival date & time 03/07/12  2325   First MD Initiated Contact with Patient 03/08/12 0220      Chief Complaint  Patient presents with  . Asthma  . Urinary Tract Infection    (Consider location/radiation/quality/duration/timing/severity/associated sxs/prior treatment) HPI Comments: Out of inhaler.  Patient is a 36 y.o. female presenting with asthma and urinary tract infection. The history is provided by the patient.  Asthma This is a recurrent problem. The current episode started 1 to 2 hours ago. The problem occurs constantly. The problem has been resolved. Associated symptoms include shortness of breath. Pertinent negatives include no chest pain. The symptoms are aggravated by nothing. The symptoms are relieved by nothing. She has tried nothing for the symptoms.  Urinary Tract Infection Associated symptoms include shortness of breath. Pertinent negatives include no chest pain.    Past Medical History  Diagnosis Date  . Bipolar 1 disorder, depressed, mild   . Migraines   . GERD (gastroesophageal reflux disease)   . Bilateral ovarian cysts   . Asthma   . Pregnancy induced hypertension   . HTN (hypertension)     Past Surgical History  Procedure Date  . Cesarean section   . Induced abortion   . Tubal ligation     History reviewed. No pertinent family history.  History  Substance Use Topics  . Smoking status: Current Everyday Smoker -- 0.5 packs/day    Types: Cigarettes  . Smokeless tobacco: Never Used  . Alcohol Use: Yes     every other week    OB History    Grav Para Term Preterm Abortions TAB SAB Ect Mult Living   3 2 1 1 1 1    2       Review of Systems  Respiratory: Positive for shortness of breath.   Cardiovascular: Negative for chest pain.  All other systems reviewed and are negative.    Allergies  Prednisone  Home Medications   Current Outpatient Rx  Name Route Sig Dispense Refill  . ALBUTEROL 90 MCG/ACT IN  AERS Inhalation Inhale 2 puffs into the lungs every 6 (six) hours as needed. Shortness of breath     . BUTALBITAL-APAP PO Oral Take 3 tablets by mouth daily as needed. headaches    . IBUPROFEN 200 MG PO TABS Oral Take 400 mg by mouth 3 (three) times daily as needed. For headaches       BP 125/72  Pulse 77  Temp 99 F (37.2 C)  Resp 18  SpO2 97%  Physical Exam  Nursing note and vitals reviewed. Constitutional: She is oriented to person, place, and time. She appears well-developed and well-nourished. No distress.  HENT:  Head: Normocephalic and atraumatic.  Right Ear: External ear normal.  Left Ear: External ear normal.  Neck: Normal range of motion. Neck supple.  Cardiovascular: Normal rate and regular rhythm.   No murmur heard. Pulmonary/Chest: Effort normal. No respiratory distress.  Abdominal: Soft. Bowel sounds are normal. She exhibits no distension.  Musculoskeletal: Normal range of motion. She exhibits no edema.  Neurological: She is alert and oriented to person, place, and time.  Skin: Skin is warm and dry. She is not diaphoretic.    ED Course  Procedures (including critical care time)   Labs Reviewed  URINALYSIS, ROUTINE W REFLEX MICROSCOPIC   No results found.   No diagnosis found.    MDM  Sounds clear after receiving neb, sats are 100% on room air.  Urine  clear.  Will discharge to home with albuterol.          Geoffery Lyons, MD 03/08/12 [phone]

## 2012-03-08 NOTE — ED Notes (Signed)
Few days ago pt began having right dided abdominal pain and dysuria. Inspiratory and expiratiory wheezes auscultated

## 2012-04-09 ENCOUNTER — Ambulatory Visit: Payer: No Typology Code available for payment source | Admitting: Obstetrics & Gynecology

## 2012-04-29 ENCOUNTER — Emergency Department (HOSPITAL_COMMUNITY)
Admission: EM | Admit: 2012-04-29 | Discharge: 2012-04-30 | Disposition: A | Discharge status: Home / Self Care | Payer: Self-pay | Attending: Emergency Medicine | Admitting: Emergency Medicine

## 2012-04-29 DIAGNOSIS — I10 Essential (primary) hypertension: Secondary | ICD-10-CM | POA: Insufficient documentation

## 2012-04-29 DIAGNOSIS — M62838 Other muscle spasm: Secondary | ICD-10-CM | POA: Insufficient documentation

## 2012-04-29 DIAGNOSIS — F17200 Nicotine dependence, unspecified, uncomplicated: Secondary | ICD-10-CM | POA: Insufficient documentation

## 2012-04-29 DIAGNOSIS — J45909 Unspecified asthma, uncomplicated: Secondary | ICD-10-CM | POA: Insufficient documentation

## 2012-04-29 DIAGNOSIS — F319 Bipolar disorder, unspecified: Secondary | ICD-10-CM | POA: Insufficient documentation

## 2012-04-29 DIAGNOSIS — I4510 Unspecified right bundle-branch block: Secondary | ICD-10-CM | POA: Insufficient documentation

## 2012-04-29 DIAGNOSIS — R51 Headache: Secondary | ICD-10-CM | POA: Insufficient documentation

## 2012-04-29 MED ORDER — METOCLOPRAMIDE HCL 5 MG/ML IJ SOLN
10.0000 mg | Freq: Once | INTRAMUSCULAR | Status: AC
  Administered 2012-04-30: 10 mg via INTRAVENOUS
  Filled 2012-04-29: qty 2

## 2012-04-29 MED ORDER — DIPHENHYDRAMINE HCL 50 MG/ML IJ SOLN
25.0000 mg | Freq: Once | INTRAMUSCULAR | Status: AC
  Administered 2012-04-30: 25 mg via INTRAVENOUS
  Filled 2012-04-29: qty 1

## 2012-04-29 MED ORDER — DIAZEPAM 5 MG/ML IJ SOLN
2.5000 mg | Freq: Once | INTRAMUSCULAR | Status: AC
  Administered 2012-04-30: 2.5 mg via INTRAVENOUS
  Filled 2012-04-29: qty 2

## 2012-04-29 NOTE — ED Notes (Signed)
Pt c/o of stabing pain in head, pt then felt pain it go down spine. NO LOC. Pt drove to ED.

## 2012-04-29 NOTE — ED Notes (Signed)
Bed:WA04<BR> Expected date:<BR> Expected time:<BR> Means of arrival:<BR> Comments:<BR> Closed

## 2012-04-29 NOTE — ED Notes (Signed)
Pt presented to the ER with c/o elevated blood pressure and also reports headache with some right side weakness. Pt states that these sx started around 1700 this evening, no change in speech noted at this time.

## 2012-04-30 ENCOUNTER — Emergency Department (HOSPITAL_COMMUNITY): Payer: Self-pay

## 2012-04-30 MED ORDER — IBUPROFEN 800 MG PO TABS: 800.0000 mg | ORAL_TABLET | Freq: Three times a day (TID) | ORAL | Status: AC

## 2012-04-30 MED ORDER — DIAZEPAM 5 MG PO TABS: 5.0000 mg | ORAL_TABLET | Freq: Three times a day (TID) | ORAL | Status: AC | PRN

## 2012-04-30 NOTE — ED Notes (Signed)
Patient transported to CT and returned 

## 2012-04-30 NOTE — ED Provider Notes (Addendum)
History     CSN: [id number]  Arrival date & time 04/29/12  2310   First MD Initiated Contact with Patient 04/29/12 2342      Chief Complaint  Patient presents with  . Headache  . hypertension and right side weakness     (Consider location/radiation/quality/duration/timing/severity/associated sxs/prior treatment) HPI 36 year old female presents to emergency department complaining of headache. Patient reports she had onset of headache today around 5:00. Patient reports headache moved to her right neck with a tightness and muscle spasm feeling. Patient felt weak in her right arm and dropped an object. Patient with history of migraines. Patient checked her blood pressure as she has prior history of hypertension and it was 160/120. Patient has been off lisinopril for some time after losing a lot of weight. Patient denies any difficulty speaking, no facial droop no difficulty swallowing no difficulties with ambulation. Patient drove herself here. Patient reports this is worse than her normal migraines. Patient reports she slept on the couch yesterday. Earlier today she has right shoulder pain, feels like her shoulder popped out, had to pop it back in. Soon after that she would've started developing pain in her right neck and right shoulder. No prior history of stroke in herself or family. Patient is not on birth control pills. No recent neck manipulation no trauma Past Medical History  Diagnosis Date  . Bipolar 1 disorder, depressed, mild   . Migraines   . GERD (gastroesophageal reflux disease)   . Bilateral ovarian cysts   . Asthma   . Pregnancy induced hypertension   . HTN (hypertension)     Past Surgical History  Procedure Date  . Cesarean section   . Induced abortion   . Tubal ligation     No family history on file.  History  Substance Use Topics  . Smoking status: Current Everyday Smoker -- 0.5 packs/day    Types: Cigarettes  . Smokeless tobacco: Never Used  . Alcohol Use:  Yes     every other week    OB History    Grav Para Term Preterm Abortions TAB SAB Ect Mult Living   3 2 1 1 1 1    2       Review of Systems  All other systems reviewed and are negative.    Allergies  Prednisone  Home Medications   Current Outpatient Rx  Name Route Sig Dispense Refill  . ALBUTEROL 90 MCG/ACT IN AERS Inhalation Inhale 2 puffs into the lungs every 6 (six) hours as needed. Shortness of breath     . BUTALBITAL-APAP PO Oral Take 3 tablets by mouth daily as needed. headaches      BP 130/76  Pulse 78  Temp(Src) 98.2 F (36.8 C) (Oral)  Resp 18  SpO2 96%  LMP 04/08/2012  Physical Exam  Nursing note and vitals reviewed. Constitutional: She is oriented to person, place, and time. She appears well-developed and well-nourished. She appears distressed.       Patient tearful appears in pain. Vital signs were reviewed and are normal  HENT:  Head: Normocephalic and atraumatic.  Right Ear: External ear normal.  Left Ear: External ear normal.  Nose: Nose normal.  Mouth/Throat: No oropharyngeal exudate.  Eyes: Conjunctivae and EOM are normal. Pupils are equal, round, and reactive to light.  Neck: Normal range of motion. No JVD present. No tracheal deviation present. No thyromegaly present.       Patient with soft tissue tenderness paraspinal muscles on right side with spasm noted  Cardiovascular: Normal rate, regular rhythm, normal heart sounds and intact distal pulses.  Exam reveals no gallop and no friction rub.   No murmur heard. Pulmonary/Chest: Effort normal and breath sounds normal. No stridor. No respiratory distress. She has no wheezes. She has no rales. She exhibits no tenderness.  Abdominal: Soft. Bowel sounds are normal. She exhibits no distension and no mass. There is no tenderness. There is no rebound and no guarding.  Musculoskeletal:       Patient with tenderness in right hip with right straight leg raise patient with some pain with palpation of the  right lumbar area. Patient with pain with palpation of right shoulder  Lymphadenopathy:    She has no cervical adenopathy.  Neurological: She is alert and oriented to person, place, and time. She has normal reflexes. She displays normal reflexes. No cranial nerve deficit. She exhibits normal muscle tone. Coordination normal.       Normal grips no facial droop normal neurologic exam  Skin: Skin is warm and dry. No rash noted. No erythema. No pallor.    ED Course  Procedures (including critical care time)  Labs Reviewed - No data to display Ct Head Wo Contrast  04/30/2012  *RADIOLOGY REPORT*  Clinical Data: Elevated blood pressure.  Severe headache.  Right- sided weakness.  CT HEAD WITHOUT CONTRAST  Technique:  Contiguous axial images were obtained from the base of the skull through the vertex without contrast.  Comparison: 09/04/2008  Findings: The ventricles and sulci appear symmetrical.  No mass effect or midline shift.  No abnormal extra-axial fluid collections.  Gray-white matter junctions are distinct.  Basal cisterns are not effaced.  No acute intracranial hemorrhage. Calcification of the pineal gland is stable.  No depressed skull fractures.  Mucosal membrane thickening in the maxillary antra and ethmoid air cells.  Mastoid air cells are not opacified.  IMPRESSION: No acute intracranial abnormalities.  Original Report Authenticated By: Marlon Pel, M.D.     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 Headache   2. Muscle spasm       MDM  36 year old female with headache and muscle spasm noted. Sutures do not appear to be consistent with a stroke. Blood pressures not elevated here. We'll get head CT given severe headache, but feels this is more likely due to 2  muscle strain or spasm after sleeping on the couch last night. We'll give Valium Reglan and Benadryl and reassess.      4:12 AM Patient feeling better muscle spasm improved still with some slight soreness. Headache has resolved  Olivia Mackie, MD 04/30/12 1610  Olivia Mackie, MD 04/30/12 [phone]

## 2012-04-30 NOTE — Discharge Instructions (Signed)
Muscle Spasm  Take medications as prescribed.  Warm moist heat, either from warm bath, hot water bottle, or heating pad to the spasm area will help with symptoms.  Expect to be sore for 7-10 days.  Follow up with your regular doctor.  If you do not have a doctor, follow up with one of the numbers listed.  Please also followup with the headache wellness clinic as directed.  Return to the ER for worsening pain, new weakness, numbness, loss of bowel or bladder control, or other concerning symptoms.  PSYCH ANXIOLYTICS BENZODIAZEPINES  PSYCH ANXIOLYTICS BENZODIAZEPINES: You have been prescribed a medication that belongs to a class called Benzodiazepines.      These medicines are used to treat anxiety (nervousness), tremors, seizures, vertigo, insomnia, nausea (especially that associated with chemotherapy), alcohol or sedative drug withdrawal, and muscle spasm; they may also be given (usually intravenously) in the ED for sedation during procedures. This medication is a "scheduled substance" that means it is against the law to share it or give it to anyone else.     You have been given a medication, or a prescription for a medication, that causes drowsiness or dizziness.  DO NOT drive a car, operate machinery, ride a bike, or perform jobs that require you to be alert until you know how you are going to react to this medicine.     Make sure your doctor knows if you have any of these conditions before you take this medication:  an alcohol or drug abuse problem, depression, glaucoma, kidney or liver disease, lung disease or breathing difficulties, myasthenia gravis, Parkinson's disease.     If you are on any of the following medications make sure that your doctor knows before you start this medication as they can cause some undesirable interactions:  Seizure medicines (used for convulsion or epilepsy), chloroquine, cimetidine (Tagamet), digoxin (Lanoxin), disulfiram (Antabuse), or erythromycin.     DO  NOT drink alcoholic beverages while taking this medicine.     If you become dizzy, sit or lie down at the first signs.  You should be careful going up and down stairs.     This medication may cause side-effects.  If they are bothersome, discontinue the medication.  If they are severe, follow-up with your physician or return to the Emergency Department for a recheck.  These side-effects include:  dizziness, depression, headaches, blurry vision, and problems sleeping. Tell your doctor if you are taking any of the following medicines:      DO NOT drink alcoholic beverages while taking this medicine.     Medications for your stomach, Cyclosporin, Medications for your heart or blood pressure, Diflucan, Theophylline, Isoniazid, Antibiotics, Migraine medicines, Medications for seizures, depression, or mental illness.     If you become dizzy, sit or lie down at the first signs.  You should be careful going up and down stairs. DO NOT take more of this medicine than prescribed.  Taking too much of this medicine can cause DEATH.      If you miss a dose do not "double up."  DO NOT take extra doses as this will not help you feel any better any faster.  It may even cause unwanted side-effects.     Contact your doctor immediately if you develop an allergic reaction, you feel lightheaded, confused, drowsy, or experience thoughts of hurting yourself or others.  Call also if you experience yellowing of the eyes or skin, or abnormal muscle twitching or movements.  DO NOT take this medication if you are pregnant or are nursing or you are actively trying to become pregnant.     Keep this medication out of the reach of children.  Always keep this medication in child-proof containers.  DO NOT give your medication to anyone else. This drug may be habit-forming (addictive).  DO NOT use it for more than one week without discussing it with your regular doctor.  You have been given a medication, or a prescription  for a medication, that causes drowsiness or dizziness.  DO NOT drive a car, operate machinery, ride a bike, or perform jobs that require you to be alert until you know how you are going to react to this medicine.  THESE INSTRUCTIONS ARE NOT COMPREHENSIVE (complete):  Ask your pharmacist for additional information and precautions for this medication.   PAIN NSAID MOTRIN  PAIN NSAID MOTRIN: You have been given a medication that contains ibuprofen.     This medication is often used to relieve pain, reduce fever, reduce inflammation, or to help prevent the ureteral spasm and pain associated with kidney stones.    DO NOT take this medication if you  have stomach ulcers or are sensitive / allergic to ibuprofen.    DO NOT take this medication if you are taking other over-the-counter medications that contain ibuprofen.  Never take more of the medication than prescribed.  Overdosing of medication may cause damage to your kidneys.    If you have side-effects that you think are caused by this medicine, tell your doctor.  If you develop stomach pain, vomit blood, or have bowel movements that become black and tarry, discontinue the medication and notify your physician immediately.    This medication may upset your stomach.  Always take medication with milk or meals.    Keep this medication out of the reach of children.  Always keep this medication in child-proof containers.  DO NOT give your medication to anyone else. THESE INSTRUCTIONS ARE NOT COMPREHENSIVE (complete):  Ask your pharmacist for additional information and precautions for this medication.  PAIN, GENERAL - WITH PAIN MEDICATION  PAIN, GENERAL: You have been seen today for treatment of your pain.  The physician who treated you did not feel that the cause of your pain was dangerous and felt it was OK to treat your symptoms.  You will be given a prescription for pain medication to treat your pain. Use the pain medication as needed for  discomfort. It may be to your advantage to take the pain medications regularly, around the clock as directed for the next a few days. By doing this, you can build up a helpful amount of the medicine in your system.  YOU SHOULD SEEK MEDICAL ATTENTION IMMEDIATELY, EITHER HERE OR AT THE NEAREST EMERGENCY DEPARTMENT, IF ANY OF THE FOLLOWING OCCURS:      Your pain becomes worse, despite treatment with pain medications.     You develop any other significant symptoms.  MUSCLE STRAIN, GENERAL  MUSCLE STRAIN, GENERAL: You have been diagnosed with a muscle strain.  Any muscle in the body can be strained. A strain is an injury to muscles where some of the muscle fibers are injured by being stretched or partially torn. This usually happens by overusing the muscle or performing an activity that the muscle is not used to doing.  Some of the symptoms of a strain include pain, muscle cramping, and soreness to the touch.  Often, the pain and stiffness in the muscle is  worse the next day. This is much like what happens when a person begins exercising for the first time. After the exercise session, the person may feel pretty good, however the next day all of the exercised muscles feel stiff and sore.  The general treatment for a strain includes the following:      Resting the affected part.     Pain medication.     Muscle relaxant medications.     Warm compresses (such as a warm, moist towel).     Gentle stretching of the injured muscle.     And when tolerated, gentle massage of the affected area. This injury is self-limited (it gets better on its own) and rarely requires specific treatment.  YOU SHOULD SEEK MEDICAL ATTENTION IMMEDIATELY, EITHER HERE OR AT THE NEAREST EMERGENCY DEPARTMENT, IF ANY OF THE FOLLOWING OCCURS:      Significant increase in swelling of the affected area.     Worsening pain instead of gradual improvement.     Redness of the skin over the affected area.     Inability  to use the affected limb. Weakness or numbness of the limb.  IMPORTANCE OF PRIMARY CARE DOCTOR (EDU)  IMPORTANCE OF PRIMARY CARE DOCTOR (EDU): You have been given instructions to follow up with a primary care physician.  A primary care physician is a doctor who helps with your health maintenance. For example, he or she provides yearly health exams to help determine your general well-being along with regular check-ups to help to identify potential health problems.  Your primary care physician serves as a main resource on all aspects of your health. In addition to treating existing medical conditions, this physician monitors your health over time. Your primary doctor can help you to recognize symptoms, or changes in your body that could be signs of new illness. Primary care physicians can look at the big picture, including your lifestyle and family history. They can help plan the best ways of staying healthy and leading a long, productive life. They are also an important part in making referrals to specialists (such as doctors who specialize in specific disease conditions such as diabetes, heart disease, etc.).  There are many types of physicians who provide primary care. They all offer the benefits of a lasting, personal relationship based upon mutual trust and a thorough knowledge of an individual person. They also provide a wide range of healthcare services.      Family Medicine physicians provide comprehensive care for all family members, from newborns through older adults.     Internal Medicine physicians specialize in meeting the complete healthcare needs of adults, from teenagers through seniors, providing both primary and advanced levels of care.     Obstetrician/gynecologists often serve as primary physicians for women, performing routine physicals and health screenings in addition to obstetrical and gynecological care.     Pediatricians are experts in primary care for children, usually  from infancy through the teen years. Primary care doctors may be either MDs or DOs. With today's modern medical training, the differences between an MD (Medical Doctor) and a D.O. (Doctor of Osteopathic Medicine) are minimal. Both MD's and DOs go to medical school and complete residencies in various medical specialties.  If you do not have a primary care physician, it takes a little homework and determination on your part. There are several options available in selecting the most appropriate doctor for your care. There are referrals lines in your local area as well as specialists that work  with your specific health care plan. Many people find a physician through word-of-mouth, asking their friends, neighbors or relatives. There are also referral lines in your local area. Hospital physician referral services are also another option. Your health care plan may also offer referral services and most health plans offer the "Ask A Nurse" service. Referral services offer backgrounds of potential physicians, their educational and practice history, age range, office locations and hours, and the types of insurance coverage that they accept.  When you have decided which doctor may be right for you, make an appointment to ask questions about issues that are important to you. Frequently asked questions include the following:      Is the doctor on staff at a hospital? Which hospital?     What is the doctor's educational background?     Does the doctor specialize in certain areas of medicine?     How many years has his or her practice been established?     Is the doctor in practice by himself or herself, or in a group practice?     Is his or her office conveniently located?     What hours are available for appointments?     What types of insurance coverage does the doctor accept?     If you're on Medicare or Medicaid, does the doctor accept these plans?     How far in advance do you have to make an  appointment? Are same-day appointments available?     How does the doctor handle situations when you need to see a doctor urgently?     What is the doctor's fee schedule? When is payment expected and how can it be made? When seeing a patient for the first time in a non-emergency situation, most doctors will begin a medical chart. This chart includes information about your health history. This record should include your present state of health, personal statistics (age, height, weight, occupation, whether you're a smoker or non-smoker), and your family history.  Establishing a GOOD RAPPORT (relationship) with your family doctor is EXTREMELY important! A PCP (Primary Care Physician) is the cornerstone of your care and should be the first person you call with any health concerns or problems. Being an established patient is VERY important so that you can be seen quickly when an illness or injury does occur. Plan ahead and make an appointment with your chosen physician to become an established patient of his or her practice.  If you develop symptoms of Shortness of Breath, Chest Pain, Swelling of lips, mouth or tongue or if your condition becomes worse with any new symptoms, see your doctor or return to the Emergency Department for immediate care. Emergency services are not intended to be a substitute for comprehensive medical attention.  Please contact your doctor for follow up if not improving as expected.   Call your doctor in 5-7 days or as directed if there is no improvement.   Community Resources: *IF YOU ARE IN IMMEDIATE DANGER CALL 911!  Abuse/Neglect:  Family Services Crisis Hotline [facility]): 864-830-5029 [facility] Against Violence [facility]): 765-817-1568  After hours, holidays and weekends: 505-477-8519 National Domestic Violence Hotline: (406) 313-7042  Mental Health: [facility]: [facility]: (717) 756-6732  Health Clinics:  Urgent Care Center  [facility]): [phone] Monday - Friday 8 AM - 9 PM, Saturday and Sunday 10 AM - 9 PM  [facility] [place]: [phone] Monday - Friday 8  AM - 5 PM  [facility]  [address]: [phone] Monday- Friday 8:30 AM - 5:30 PM, Sat 9 AM - 1 PM  [facility] [address]: [phone] [facility]: [phone] [facility] on [address]: [phone]  [facility]: [address] [phone]  Cultures: If culture results are positive, we will notify you if a change in treatment is necessary.  LABORATORY TESTS:         If you had any labs drawn in the ED that have not resulted by the time you are discharged home, we will review these lab results and the treatment given to you.  If there is any further treatment or notification needed, we will contact you by phone, or letter.  "PLEASE ENSURE THAT YOU HAVE GIVEN [place] YOUR CURRENT WORKING PHONE NUMBER AND YOUR CURRENT ADDRESS, so that we can contact you if needed."  RADIOLOGY TESTS:  If the referred physician wants today's x-rays, please call the hospital's Radiology Department the day before your doctor's appointment. [facility]     [phone] [address]   [phone] [address] [address]     [phone]  Our doctors and staff appreciate your choosing [place] for your emergency medical care needs. We are here to serve you.  Headache Headaches are caused by many different problems. Most commonly, headache is caused by muscle tension from an injury, fatigue, or emotional upset. Excessive muscle contractions in the scalp and neck result in a headache that often feels like a tight band around the head. Tension headaches often have areas of tenderness over the scalp and the back of the neck. These headaches may last for hours, days, or longer, and some may contribute to migraines in those who have migraine problems. Migraines usually cause a throbbing headache, which  is made worse by activity. Sometimes only one side of the head hurts. Nausea, vomiting, eye pain, and avoidance of food are common with migraines. Visual symptoms such as light sensitivity, blind spots, or flashing lights may also occur. Loud noises may worsen migraine headaches. Many factors may cause migraine headaches:  Emotional stress, lack of sleep, and menstrual periods.   Alcohol and some drugs (such as birth control pills).   Diet factors (fasting, caffeine, food preservatives, chocolate).   Environmental factors (weather changes, bright lights, odors, smoke).  Other causes of headaches include minor injuries to the head. Arthritis in the neck; problems with the jaw, eyes, ears, or nose are also causes of headaches. Allergies, drugs, alcohol, and exposure to smoke can also cause moderate headaches. Rebound headaches can occur if someone uses pain medications for a long period of time and then stops. Less commonly, blood vessel problems in the neck and brain (including stroke) can cause various types of headache. Treatment of headaches includes medicines for pain and relaxation. Ice packs or heat applied to the back of the head and neck help some people. Massaging the shoulders, neck and scalp are often very useful. Relaxation techniques and stretching can help prevent these headaches. Avoid alcohol and cigarette smoking as these tend to make headaches worse. Please see your caregiver if your headache is not better in 2 days.  SEEK IMMEDIATE MEDICAL CARE IF:   You develop a high fever, chills, or repeated vomiting.   You faint or have difficulty with vision.   You develop unusual numbness or weakness of your arms or legs.   Relief of pain is inadequate with  medication, or you develop severe pain.   You develop confusion, or neck stiffness.   You have a worsening of a headache or do not obtain relief.  Document Released: 12/05/2005 Document Revised: 11/24/2011 Document Reviewed:  05/31/2007 [facility] Patient Information 2012 [place], [place].  Headache Headaches are caused by many different problems. Most commonly, headache is caused by muscle tension from an injury, fatigue, or emotional upset. Excessive muscle contractions in the scalp and neck result in a headache that often feels like a tight band around the head. Tension headaches often have areas of tenderness over the scalp and the back of the neck. These headaches may last for hours, days, or longer, and some may contribute to migraines in those who have migraine problems. Migraines usually cause a throbbing headache, which is made worse by activity. Sometimes only one side of the head hurts. Nausea, vomiting, eye pain, and avoidance of food are common with migraines. Visual symptoms such as light sensitivity, blind spots, or flashing lights may also occur. Loud noises may worsen migraine headaches. Many factors may cause migraine headaches:  Emotional stress, lack of sleep, and menstrual periods.   Alcohol and some drugs (such as birth control pills).   Diet factors (fasting, caffeine, food preservatives, chocolate).   Environmental factors (weather changes, bright lights, odors, smoke).  Other causes of headaches include minor injuries to the head. Arthritis in the neck; problems with the jaw, eyes, ears, or nose are also causes of headaches. Allergies, drugs, alcohol, and exposure to smoke can also cause moderate headaches. Rebound headaches can occur if someone uses pain medications for a long period of time and then stops. Less commonly, blood vessel problems in the neck and brain (including stroke) can cause various types of headache. Treatment of headaches includes medicines for pain and relaxation. Ice packs or heat applied to the back of the head and neck help some people. Massaging the shoulders, neck and scalp are often very useful. Relaxation techniques and stretching can help prevent these headaches. Avoid  alcohol and cigarette smoking as these tend to make headaches worse. Please see your caregiver if your headache is not better in 2 days.  SEEK IMMEDIATE MEDICAL CARE IF:   You develop a high fever, chills, or repeated vomiting.   You faint or have difficulty with vision.   You develop unusual numbness or weakness of your arms or legs.   Relief of pain is inadequate with medication, or you develop severe pain.   You develop confusion, or neck stiffness.   You have a worsening of a headache or do not obtain relief.  Document Released: 12/05/2005 Document Revised: 11/24/2011 Document Reviewed: 05/31/2007 [facility] Patient Information 2012 [place], [place].

## 2012-11-03 ENCOUNTER — Inpatient Hospital Stay (HOSPITAL_COMMUNITY): Payer: Self-pay

## 2012-11-03 ENCOUNTER — Encounter (HOSPITAL_COMMUNITY): Payer: Self-pay

## 2012-11-03 ENCOUNTER — Inpatient Hospital Stay (HOSPITAL_COMMUNITY)
Admission: AD | Admit: 2012-11-03 | Discharge: 2012-11-03 | Disposition: A | Discharge status: Home / Self Care | Payer: Self-pay | Source: Ambulatory Visit | Attending: Obstetrics & Gynecology | Admitting: Obstetrics & Gynecology

## 2012-11-03 DIAGNOSIS — R110 Nausea: Secondary | ICD-10-CM | POA: Insufficient documentation

## 2012-11-03 DIAGNOSIS — R42 Dizziness and giddiness: Secondary | ICD-10-CM

## 2012-11-03 DIAGNOSIS — D219 Benign neoplasm of connective and other soft tissue, unspecified: Secondary | ICD-10-CM

## 2012-11-03 DIAGNOSIS — D259 Leiomyoma of uterus, unspecified: Secondary | ICD-10-CM | POA: Insufficient documentation

## 2012-11-03 DIAGNOSIS — R030 Elevated blood-pressure reading, without diagnosis of hypertension: Secondary | ICD-10-CM | POA: Insufficient documentation

## 2012-11-03 LAB — URINALYSIS, ROUTINE W REFLEX MICROSCOPIC
Bilirubin Urine: NEGATIVE
Glucose, UA: NEGATIVE mg/dL
Ketones, ur: 15 mg/dL — AB
Protein, ur: NEGATIVE mg/dL
Urobilinogen, UA: 1 mg/dL (ref 0.0–1.0)

## 2012-11-03 LAB — COMPREHENSIVE METABOLIC PANEL
AST: 15 U/L (ref 0–37)
Albumin: 3.7 g/dL (ref 3.5–5.2)
BUN: 13 mg/dL (ref 6–23)
CO2: 23 mEq/L (ref 19–32)
Calcium: 9.3 mg/dL (ref 8.4–10.5)
Chloride: 102 mEq/L (ref 96–112)
Creatinine, Ser: 0.75 mg/dL (ref 0.50–1.10)
GFR calc non Af Amer: >90 mL/min (ref >90)
Total Bilirubin: 0.3 mg/dL (ref 0.3–1.2)

## 2012-11-03 LAB — CBC WITH DIFFERENTIAL/PLATELET
Basophils Absolute: 0 10*3/uL (ref 0.0–0.1)
Basophils Relative: 0 % (ref 0–1)
Eosinophils Relative: 6 % — ABNORMAL HIGH (ref 0–5)
HCT: 39.5 % (ref 36.0–46.0)
Hemoglobin: 13.5 g/dL (ref 12.0–15.0)
MCHC: 34.2 g/dL (ref 30.0–36.0)
MCV: 94.3 fL (ref 78.0–100.0)
Monocytes Absolute: 0.7 10*3/uL (ref 0.1–1.0)
Monocytes Relative: 8 % (ref 3–12)
RDW: 12.6 % (ref 11.5–15.5)

## 2012-11-03 LAB — WET PREP, GENITAL
Clue Cells Wet Prep HPF POC: NONE SEEN
Trich, Wet Prep: NONE SEEN
Yeast Wet Prep HPF POC: NONE SEEN

## 2012-11-03 LAB — URINE MICROSCOPIC-ADD ON

## 2012-11-03 MED ORDER — KETOROLAC TROMETHAMINE 60 MG/2ML IM SOLN
60.0000 mg | Freq: Once | INTRAMUSCULAR | Status: AC
  Administered 2012-11-03: 60 mg via INTRAMUSCULAR
  Filled 2012-11-03: qty 2

## 2012-11-03 MED ORDER — ONDANSETRON 8 MG PO TBDP: 8.0000 mg | ORAL_TABLET | Freq: Three times a day (TID) | ORAL | Status: DC | PRN

## 2012-11-03 NOTE — MAU Provider Note (Signed)
History     CSN: [id number]  Arrival date and time: 11/03/12 1631   First Provider Initiated Contact with Patient 11/03/12 1656      No chief complaint on file.  HPI [name] is a 36 y.o. female who presents to MAU with nausea and vomiting. The symptoms started 4 days ago. She has vomited 2 or 3 times a day but none today. The emesis appears as undigested food. Associated symptoms include dizziness, headache and feeling tired. Also has a pain that comes and goes in left side. Patient states she thought she may be getting a UTI. LMP 10/20/12. Last pap smear 2 years ago at [facility] and was normal. Current sex partner x 6 years. No hx of STI's. BTL for birth control. The history was provided by the patient.   OB History    Grav Para Term Preterm Abortions TAB SAB Ect Mult Living   3 2 1 1 1 1    2       Past Medical History  Diagnosis Date  . Bipolar 1 disorder, depressed, mild   . Migraines   . GERD (gastroesophageal reflux disease)   . Bilateral ovarian cysts   . Asthma   . Pregnancy induced hypertension   . HTN (hypertension)     Past Surgical History  Procedure Date  . Cesarean section   . Induced abortion   . Tubal ligation     No family history on file.  History  Substance Use Topics  . Smoking status: Current Some Day Smoker -- 0.5 packs/day    Types: Cigarettes  . Smokeless tobacco: Never Used  . Alcohol Use: No     Comment: every other week    Allergies:  Allergies  Allergen Reactions  . Prednisone Swelling and Rash    Leg swells up; pt reports I.V use with no problems    Prescriptions prior to admission  Medication Sig Dispense Refill  . albuterol (PROVENTIL,VENTOLIN) 90 MCG/ACT inhaler Inhale 2 puffs into the lungs every 6 (six) hours as needed. Shortness of breath         Review of Systems  Constitutional: Negative for fever, chills and weight loss.  HENT: Negative for ear pain, nosebleeds, congestion, sore throat and neck pain.     Eyes: Negative for blurred vision, double vision, photophobia and pain.  Respiratory: Positive for wheezing (hx of asthma). Negative for cough and shortness of breath.   Cardiovascular: Negative for chest pain, palpitations and leg swelling.  Gastrointestinal: Positive for nausea, vomiting and abdominal pain. Negative for heartburn, diarrhea and constipation.  Genitourinary: Positive for frequency. Negative for dysuria and urgency.  Musculoskeletal: Positive for back pain. Negative for myalgias.  Skin: Negative for itching and rash.  Neurological: Positive for dizziness and headaches. Negative for sensory change, speech change, seizures and weakness.  Endo/Heme/Allergies: Does not bruise/bleed easily.  Psychiatric/Behavioral: Depression: bipolar. The patient is not nervous/anxious and does not have insomnia.    Physical Exam   Blood pressure 130/102, pulse 87, temperature 98 F (36.7 C), temperature source Oral, resp. rate 20, last menstrual period 10/20/2012.  Physical Exam  Nursing note and vitals reviewed. Constitutional: She is oriented to person, place, and time. She appears well-developed and well-nourished. No distress.       Elevated BP  HENT:  Head: Normocephalic and atraumatic.  Eyes: EOM are normal.  Neck: Neck supple.  Cardiovascular: Normal rate.   Respiratory: Effort normal.  GI: Soft. Normal appearance. There is tenderness in the  right lower quadrant. There is no rigidity, no rebound, no guarding and no CVA tenderness.  Genitourinary:       External genitalia without lesions, thick white discharge vaginal vault. No CMT, right adnexal tenderness. Uterus without palpable enlargement.  Musculoskeletal: Normal range of motion.  Neurological: She is alert and oriented to person, place, and time. No cranial nerve deficit.  Skin: Skin is warm and dry.  Psychiatric: She has a normal mood and affect. Her behavior is normal. Judgment and thought content normal.   Results for  orders placed during the hospital encounter of 11/03/12 (from the past 24 hour(s))  POCT PREGNANCY, URINE     Status: Normal   Collection Time   11/03/12  4:51 PM      Component Value Range   Preg Test, Ur NEGATIVE  NEGATIVE  URINALYSIS, ROUTINE W REFLEX MICROSCOPIC     Status: Abnormal   Collection Time   11/03/12  4:55 PM      Component Value Range   Color, Urine YELLOW  YELLOW   APPearance HAZY (*) CLEAR   Specific Gravity, Urine >1.030 (*) 1.005 - 1.030   pH 6.0  5.0 - 8.0   Glucose, UA NEGATIVE  NEGATIVE mg/dL   Hgb urine dipstick TRACE (*) NEGATIVE   Bilirubin Urine NEGATIVE  NEGATIVE   Ketones, ur 15 (*) NEGATIVE mg/dL   Protein, ur NEGATIVE  NEGATIVE mg/dL   Urobilinogen, UA 1.0  0.0 - 1.0 mg/dL   Nitrite NEGATIVE  NEGATIVE   Leukocytes, UA NEGATIVE  NEGATIVE  URINE MICROSCOPIC-ADD ON     Status: Abnormal   Collection Time   11/03/12  4:55 PM      Component Value Range   Squamous Epithelial / LPF MANY (*) RARE   WBC, UA 3-6  <3 WBC/hpf   RBC / HPF 0-2  <3 RBC/hpf   Bacteria, UA MANY (*) RARE  CBC WITH DIFFERENTIAL     Status: Abnormal   Collection Time   11/03/12  5:04 PM      Component Value Range   WBC 8.4  4.0 - 10.5 K/uL   RBC 4.19  3.87 - 5.11 MIL/uL   Hemoglobin 13.5  12.0 - 15.0 g/dL   HCT 16.1  09.6 - 04.5 %   MCV 94.3  78.0 - 100.0 fL   MCH 32.2  26.0 - 34.0 pg   MCHC 34.2  30.0 - 36.0 g/dL   RDW 40.9  81.1 - 91.4 %   Platelets 356  150 - 400 K/uL   Neutrophils Relative 39 (*) 43 - 77 %   Neutro Abs 3.3  1.7 - 7.7 K/uL   Lymphocytes Relative 47 (*) 12 - 46 %   Lymphs Abs 4.0  0.7 - 4.0 K/uL   Monocytes Relative 8  3 - 12 %   Monocytes Absolute 0.7  0.1 - 1.0 K/uL   Eosinophils Relative 6 (*) 0 - 5 %   Eosinophils Absolute 0.5  0.0 - 0.7 K/uL   Basophils Relative 0  0 - 1 %   Basophils Absolute 0.0  0.0 - 0.1 K/uL  COMPREHENSIVE METABOLIC PANEL     Status: Normal   Collection Time   11/03/12  5:04 PM      Component Value Range   Sodium 136  135  - 145 mEq/L   Potassium 3.7  3.5 - 5.1 mEq/L   Chloride 102  96 - 112 mEq/L   CO2 23  19 - 32 mEq/L  Glucose, Bld 91  70 - 99 mg/dL   BUN 13  6 - 23 mg/dL   Creatinine, Ser 4.09  0.50 - 1.10 mg/dL   Calcium 9.3  8.4 - 81.1 mg/dL   Total Protein 6.5  6.0 - 8.3 g/dL   Albumin 3.7  3.5 - 5.2 g/dL   AST 15  0 - 37 U/L   ALT 16  0 - 35 U/L   Alkaline Phosphatase 51  39 - 117 U/L   Total Bilirubin 0.3  0.3 - 1.2 mg/dL   GFR calc non Af Amer >90  >90 mL/min   GFR calc Af Amer >90  >90 mL/min  WET PREP, GENITAL     Status: Abnormal   Collection Time   11/03/12  5:27 PM      Component Value Range   Yeast Wet Prep HPF POC NONE SEEN  NONE SEEN   Trich, Wet Prep NONE SEEN  NONE SEEN   Clue Cells Wet Prep HPF POC NONE SEEN  NONE SEEN   WBC, Wet Prep HPF POC FEW (*) NONE SEEN   US Transvaginal Non-ob  11/03/2012  *RADIOLOGY REPORT*  Clinical Data: Right adnexal tenderness with nausea and vomiting. Irregular cycles.  TRANSABDOMINAL AND TRANSVAGINAL ULTRASOUND OF PELVIS  Technique:  Both transabdominal and transvaginal ultrasound examinations of the pelvis were performed.  Transabdominal technique was performed for global imaging of the pelvis including uterus, ovaries, adnexal regions, and pelvic cul-de-sac.  It was necessary to proceed with endovaginal exam following the transabdominal exam to visualize the uterus, endometrium, ovaries, and adnexae.  Comparison:  03/16/2011  Findings: Uterus:  There are two uterine fibroids as outlined on the pelvis ultrasound worksheet.  Overall size the uterus is 8.6 x 4.1 x 5.5 cm.  Endometrium: Normal in thickness and appearance.  Right ovary: Normal appearance/no adnexal mass  Left ovary: Normal appearance/no adnexal mass  Other Findings:  No free fluid  Compared with the prior study, there is a similar appearance.  IMPRESSION: Uterine fibroids.  No adnexal mass.  Normal endometrium.   Original Report Authenticated By: Davonna Belling, M.D.    US Pelvis  Complete  11/03/2012  *RADIOLOGY REPORT*  Clinical Data: Right adnexal tenderness with nausea and vomiting. Irregular cycles.  TRANSABDOMINAL AND TRANSVAGINAL ULTRASOUND OF PELVIS  Technique:  Both transabdominal and transvaginal ultrasound examinations of the pelvis were performed.  Transabdominal technique was performed for global imaging of the pelvis including uterus, ovaries, adnexal regions, and pelvic cul-de-sac.  It was necessary to proceed with endovaginal exam following the transabdominal exam to visualize the uterus, endometrium, ovaries, and adnexae.  Comparison:  03/16/2011  Findings: Uterus:  There are two uterine fibroids as outlined on the pelvis ultrasound worksheet.  Overall size the uterus is 8.6 x 4.1 x 5.5 cm.  Endometrium: Normal in thickness and appearance.  Right ovary: Normal appearance/no adnexal mass  Left ovary: Normal appearance/no adnexal mass  Other Findings:  No free fluid  Compared with the prior study, there is a similar appearance.  IMPRESSION: Uterine fibroids.  No adnexal mass.  Normal endometrium.   Original Report Authenticated By: Davonna Belling, M.D.     Assessment: 36 y.o. female with nausea   Elevated BP   Uterine fibroids  Plan:  Ibuprofen prn   Follow up with GYN Clinic   B.R.A.T diet   If symptoms of nausea and dizziness worsen go to [facility] for further evaluation   Medication List     As of 11/03/2012  8:38  PM    START taking these medications         ondansetron 8 MG disintegrating tablet   Commonly known as: ZOFRAN-ODT   Take 1 tablet (8 mg total) by mouth every 8 (eight) hours as needed for nausea.      CONTINUE taking these medications         albuterol 90 MCG/ACT inhaler   Commonly known as: PROVENTIL,VENTOLIN          Where to get your medications    These are the prescriptions that you need to pick up. We sent them to a specific pharmacy, so you will need to go there to get them.   [id number] [place],  - [address]    [address] [place] [address]    Phone: [phone]        ondansetron 8 MG disintegrating tablet            MAU Course  Procedures  Gracelynn Bircher, RN, FNP, [facility] 11/03/2012, 5:22 PM

## 2012-11-03 NOTE — MAU Note (Signed)
Past 4 or 5 days been nauseated and vomiting. Having lower abd pain and pain on rt side, started 4 days ago, pain comes and goes, reminds her of when she had ovarian cyst.  had frequent urination, no pain.

## 2012-11-04 LAB — URINE CULTURE: Colony Count: 55000

## 2012-11-21 NOTE — MAU Provider Note (Signed)
Attestation of Attending Supervision of Advanced Practitioner (CNM/NP): Evaluation and management procedures were performed by the Advanced Practitioner under my supervision and collaboration. I have reviewed the Advanced Practitioner's note and chart, and I agree with the management and plan.  [place] Silveria H. 9:13 PM   

## 2013-01-05 ENCOUNTER — Emergency Department (HOSPITAL_COMMUNITY)
Admission: EM | Admit: 2013-01-05 | Discharge: 2013-01-05 | Disposition: A | Discharge status: Home / Self Care | Payer: Self-pay | Attending: Emergency Medicine | Admitting: Emergency Medicine

## 2013-01-05 ENCOUNTER — Encounter (HOSPITAL_COMMUNITY): Payer: Self-pay | Admitting: Emergency Medicine

## 2013-01-05 DIAGNOSIS — I10 Essential (primary) hypertension: Secondary | ICD-10-CM | POA: Insufficient documentation

## 2013-01-05 DIAGNOSIS — Z8659 Personal history of other mental and behavioral disorders: Secondary | ICD-10-CM | POA: Insufficient documentation

## 2013-01-05 DIAGNOSIS — Z8742 Personal history of other diseases of the female genital tract: Secondary | ICD-10-CM | POA: Insufficient documentation

## 2013-01-05 DIAGNOSIS — R197 Diarrhea, unspecified: Secondary | ICD-10-CM | POA: Insufficient documentation

## 2013-01-05 DIAGNOSIS — IMO0001 Reserved for inherently not codable concepts without codable children: Secondary | ICD-10-CM | POA: Insufficient documentation

## 2013-01-05 DIAGNOSIS — R112 Nausea with vomiting, unspecified: Secondary | ICD-10-CM | POA: Insufficient documentation

## 2013-01-05 DIAGNOSIS — J45909 Unspecified asthma, uncomplicated: Secondary | ICD-10-CM | POA: Insufficient documentation

## 2013-01-05 DIAGNOSIS — F17200 Nicotine dependence, unspecified, uncomplicated: Secondary | ICD-10-CM | POA: Insufficient documentation

## 2013-01-05 DIAGNOSIS — Z3202 Encounter for pregnancy test, result negative: Secondary | ICD-10-CM | POA: Insufficient documentation

## 2013-01-05 DIAGNOSIS — Z79899 Other long term (current) drug therapy: Secondary | ICD-10-CM | POA: Insufficient documentation

## 2013-01-05 DIAGNOSIS — Z8679 Personal history of other diseases of the circulatory system: Secondary | ICD-10-CM | POA: Insufficient documentation

## 2013-01-05 DIAGNOSIS — K219 Gastro-esophageal reflux disease without esophagitis: Secondary | ICD-10-CM | POA: Insufficient documentation

## 2013-01-05 LAB — URINALYSIS, ROUTINE W REFLEX MICROSCOPIC
Glucose, UA: NEGATIVE mg/dL
Leukocytes, UA: NEGATIVE
Protein, ur: NEGATIVE mg/dL

## 2013-01-05 LAB — URINE MICROSCOPIC-ADD ON

## 2013-01-05 MED ORDER — SODIUM CHLORIDE 0.9 % IV BOLUS (SEPSIS)
1000.0000 mL | Freq: Once | INTRAVENOUS | Status: AC
  Administered 2013-01-05: 1000 mL via INTRAVENOUS

## 2013-01-05 MED ORDER — ONDANSETRON HCL 4 MG/2ML IJ SOLN
4.0000 mg | Freq: Once | INTRAMUSCULAR | Status: AC
  Administered 2013-01-05: 4 mg via INTRAVENOUS
  Filled 2013-01-05: qty 2

## 2013-01-05 MED ORDER — MORPHINE SULFATE 4 MG/ML IJ SOLN
6.0000 mg | Freq: Once | INTRAMUSCULAR | Status: AC
  Administered 2013-01-05: 6 mg via INTRAVENOUS
  Filled 2013-01-05: qty 2

## 2013-01-05 MED ORDER — ONDANSETRON 8 MG PO TBDP: 8.0000 mg | ORAL_TABLET | Freq: Three times a day (TID) | ORAL | Status: DC | PRN

## 2013-01-05 NOTE — ED Provider Notes (Signed)
History     CSN: [id number]  Arrival date & time 01/05/13  0803   First MD Initiated Contact with Patient 01/05/13 [phone]      Chief Complaint  Patient presents with  . Abdominal Pain    (Consider location/radiation/quality/duration/timing/severity/associated sxs/prior treatment) The history is provided by the patient.   patient ports nausea vomiting and diarrhea x48 hours.  She denies melena hematochezia.  No hematemesis.  She states she was bit by a spider 2 days ago on her left leg is has a small red area there.  She denies spreading erythema.  Chills without documented fever.  She has had some myalgias.  She denies cough or shortness of breath.  No sore throat.  No recent sick contacts.  Her symptoms are mild to moderate in severity.  Nothing worsens or improves her symptoms.  Past Medical History  Diagnosis Date  . Bipolar 1 disorder, depressed, mild   . Migraines   . GERD (gastroesophageal reflux disease)   . Bilateral ovarian cysts   . Asthma   . Pregnancy induced hypertension   . HTN (hypertension)     Past Surgical History  Procedure Date  . Cesarean section   . Induced abortion   . Tubal ligation     History reviewed. No pertinent family history.  History  Substance Use Topics  . Smoking status: Current Some Day Smoker -- 0.5 packs/day    Types: Cigarettes  . Smokeless tobacco: Never Used  . Alcohol Use: No     Comment: every other week    OB History    Grav Para Term Preterm Abortions TAB SAB Ect Mult Living   3 2 1 1 1 1    2       Review of Systems  All other systems reviewed and are negative.    Allergies  Prednisone  Home Medications   Current Outpatient Rx  Name  Route  Sig  Dispense  Refill  . ALBUTEROL 90 MCG/ACT IN AERS   Inhalation   Inhale 2 puffs into the lungs every 6 (six) hours as needed. Shortness of breath          . ONDANSETRON 8 MG PO TBDP   Oral   Take 1 tablet (8 mg total) by mouth every 8 (eight) hours as needed for  nausea.   12 tablet   0     BP 139/92  Pulse 105  Temp 98.2 F (36.8 C) (Oral)  Resp 16  SpO2 98%  Physical Exam  Nursing note and vitals reviewed. Constitutional: She is oriented to person, place, and time. She appears well-developed and well-nourished. No distress.  HENT:  Head: Normocephalic and atraumatic.  Eyes: EOM are normal.  Neck: Normal range of motion.  Cardiovascular: Normal rate, regular rhythm and normal heart sounds.   Pulmonary/Chest: Effort normal and breath sounds normal.  Abdominal: Soft. She exhibits no distension. There is no tenderness.  Musculoskeletal: Normal range of motion.       Small air redness on her left mid medial lower leg.  No fluctuance.  No spreading erythema  Neurological: She is alert and oriented to person, place, and time.  Skin: Skin is warm and dry.  Psychiatric: She has a normal mood and affect. Judgment normal.    ED Course  Procedures (including critical care time)  Labs Reviewed  URINALYSIS, ROUTINE W REFLEX MICROSCOPIC - Abnormal; Notable for the following:    APPearance CLOUDY (*)     Hgb urine dipstick TRACE (*)  All other components within normal limits  URINE MICROSCOPIC-ADD ON - Abnormal; Notable for the following:    Squamous Epithelial / LPF FEW (*)     Bacteria, UA MANY (*)     All other components within normal limits  PREGNANCY, URINE   No results found.   1. Nausea vomiting and diarrhea       MDM  I suspect this is a gastroenteritis.  The patient feels much better after antibiotics pain medicine and IV fluids.  Her abdominal exam is benign.  She's describing crampy abdominal discomfort.  No urinary symptoms.  Her left lower extremity does not appear to have cellulitis.  This appears to be a localized allergic reaction from spider bite.  No signs of abscess at this time.  Discharge home in good condition with antinausea medicine.        Lyanne Co, MD 01/05/13 1146

## 2013-01-05 NOTE — ED Notes (Signed)
Pt arrives to ed c/o n/v/d onset 2 days ago. Pt sts onset due to "spider bite" on left lower leg. Pt has raised, red, welt on anterior leg.  No drainage or bleed.  Pmsx4. Last BM 2330 yesterday. Pt denies blood in stool or emesis.  Pt caox4. NAD.

## 2013-03-20 IMAGING — CT CT HEAD W/O CM
2 series · 16 of 30 positions shown, 20 images · non-contrast
Comparison: 09/04/2008

CLINICAL DATA: Elevated blood pressure.  Severe headache.  Right-
sided weakness.

CT HEAD WITHOUT CONTRAST
TECHNIQUE: Contiguous axial images were obtained from the base of
the skull through the vertex without contrast.

[Series 2: head w/o · axial · non-contrast · 0.43mm/px · z∈[-204,-74]mm · 13 of 32 slices shown, 17 images]
[im 3/32  brain]
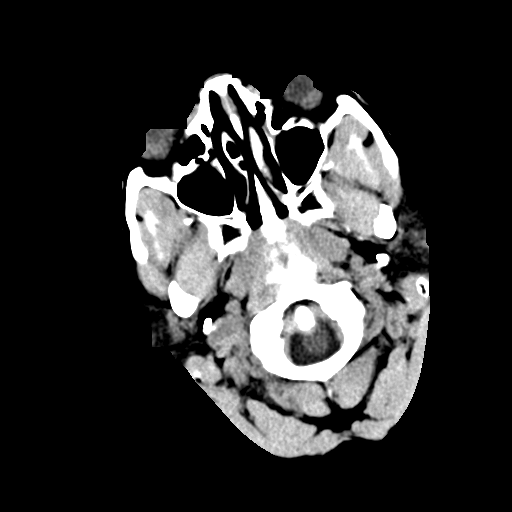
[im 3/32  bone]
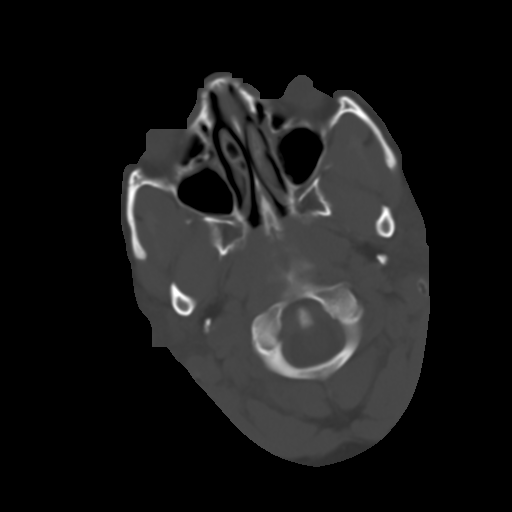
[im 5/32  brain]
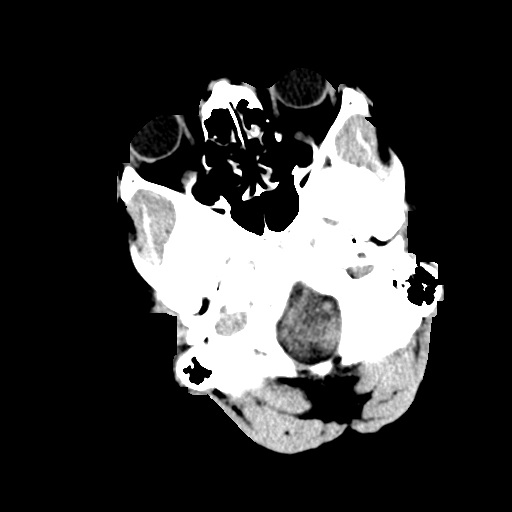
[im 7/32  brain]
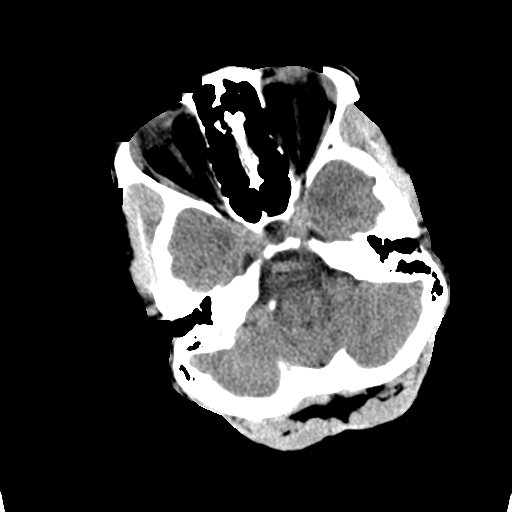
[im 9/32  brain]
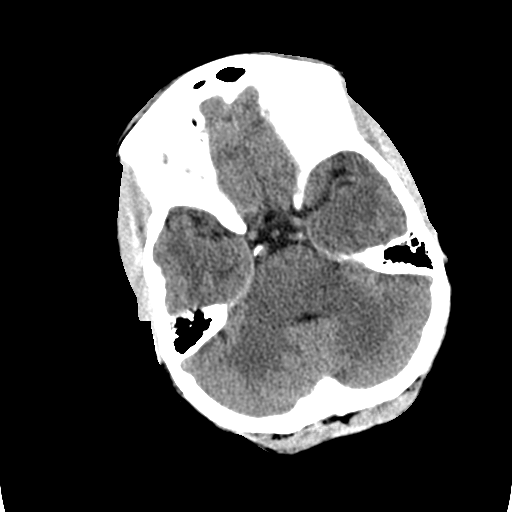
[im 12/32  brain]
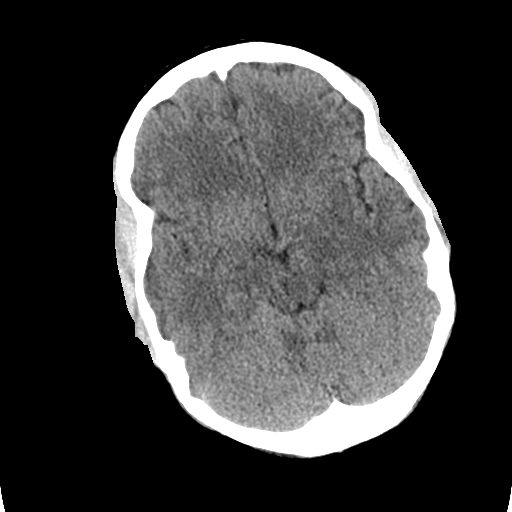
[im 12/32  bone]
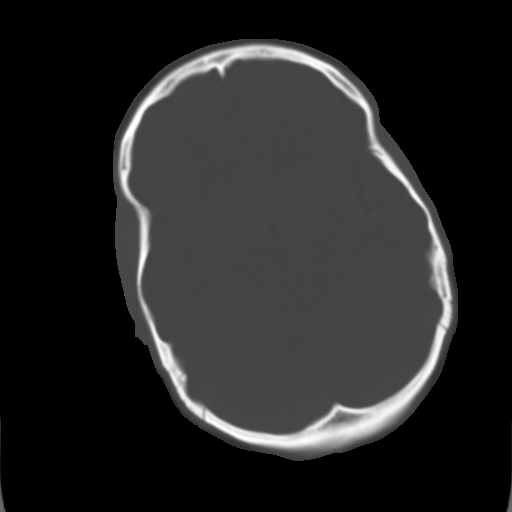
[im 14/32  brain]
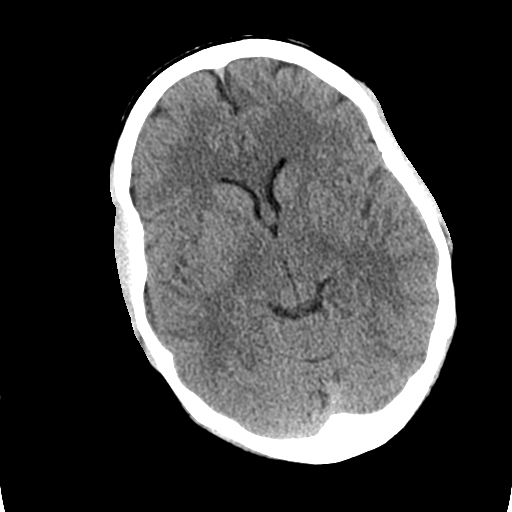
[im 16/32  brain]
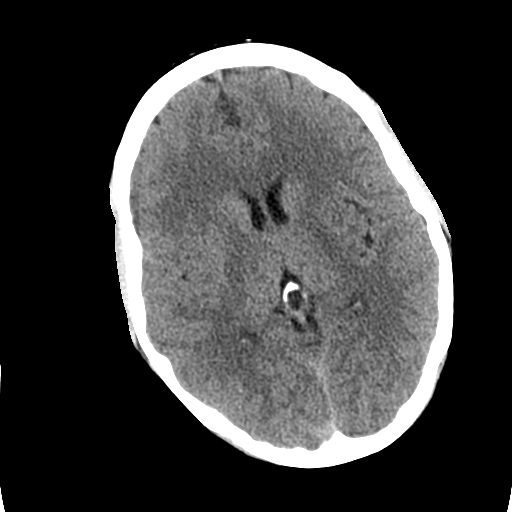
[im 18/32  brain]
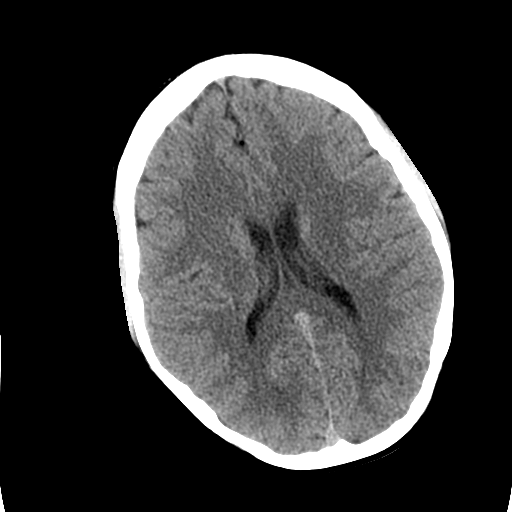
[im 20/32  brain]
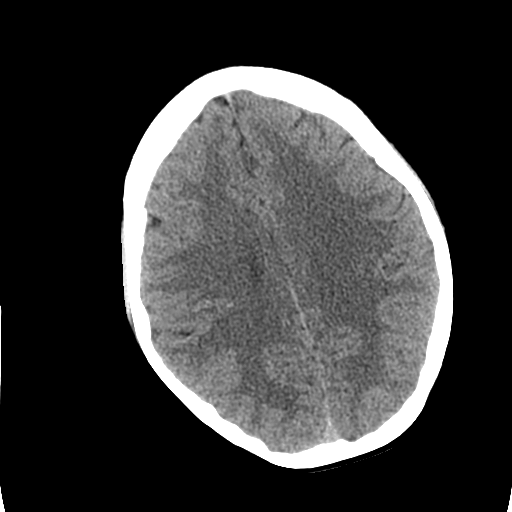
[im 20/32  bone]
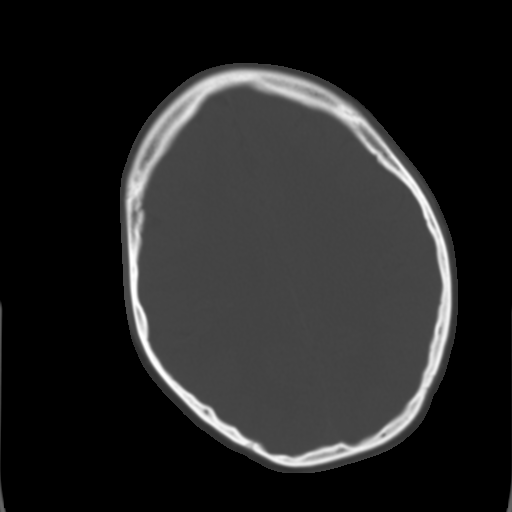
[im 23/32  brain]
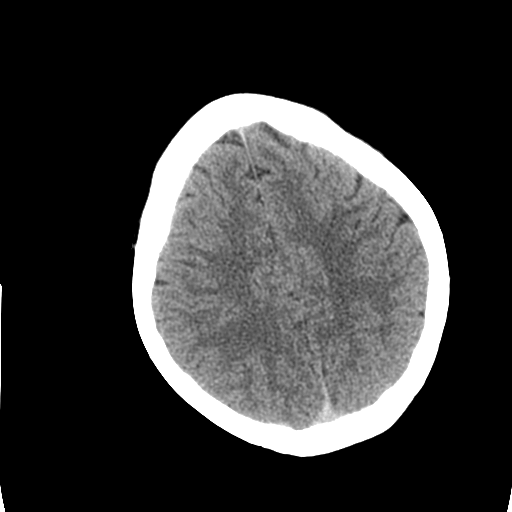
[im 25/32  brain]
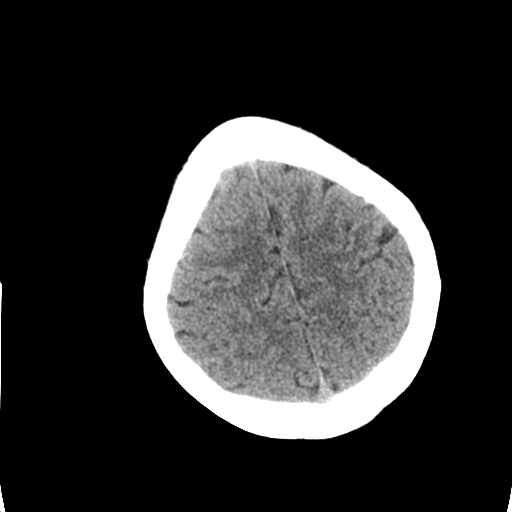
[im 27/32  brain]
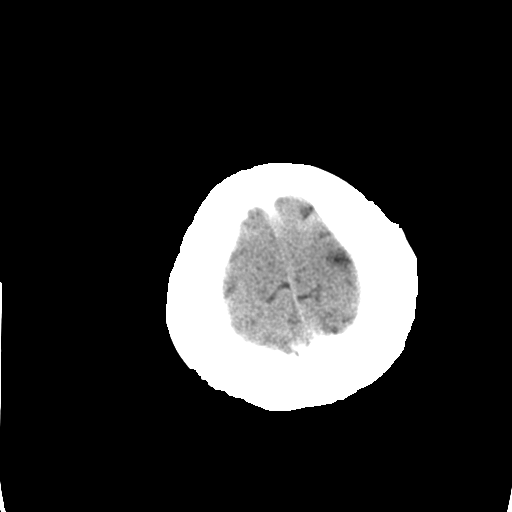
[im 29/32  brain]
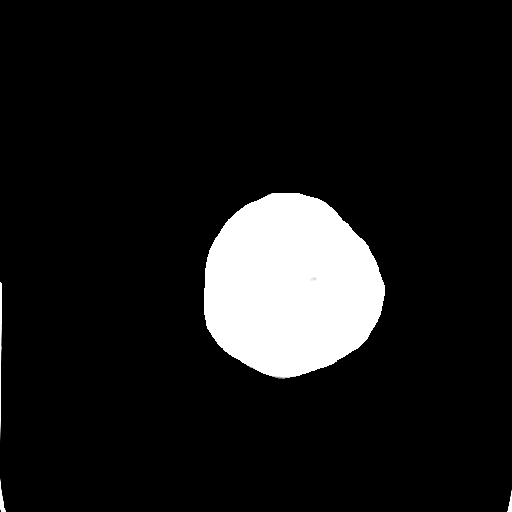
[im 29/32  bone]
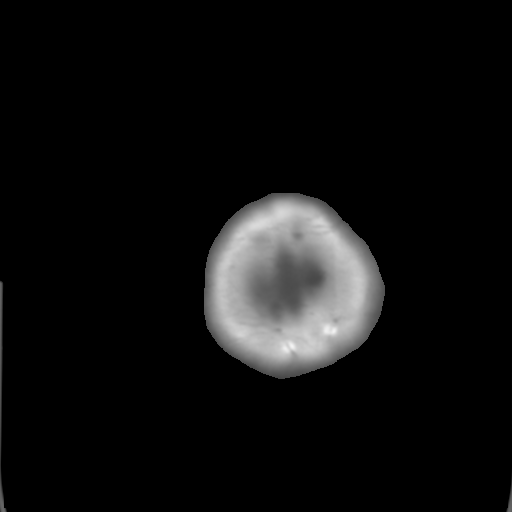

[Series 3: bone windows · axial · 0.43mm/px · z∈[-204,-159]mm · 3 of 32 slices shown]
[im 3/32  bone]
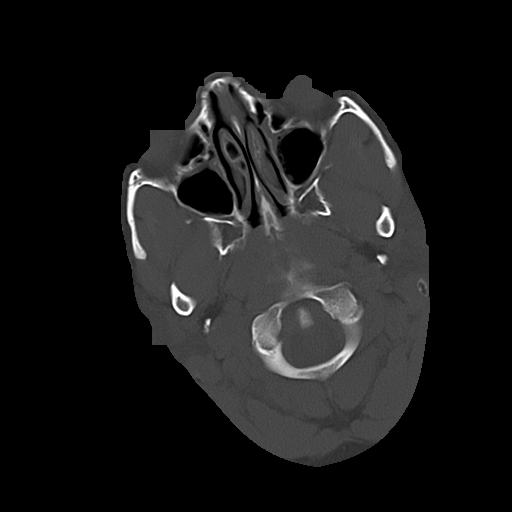
[im 7/32  bone]
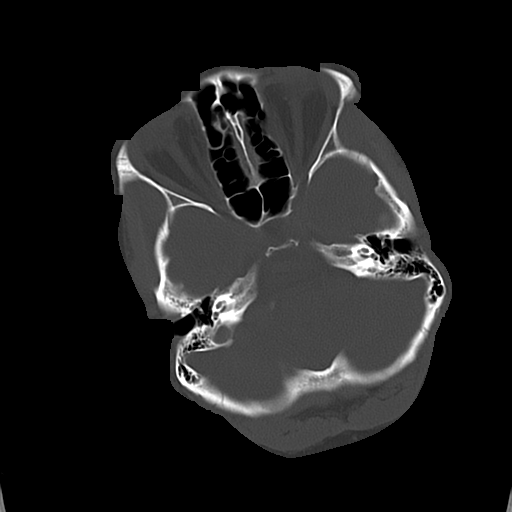
[im 12/32  bone]
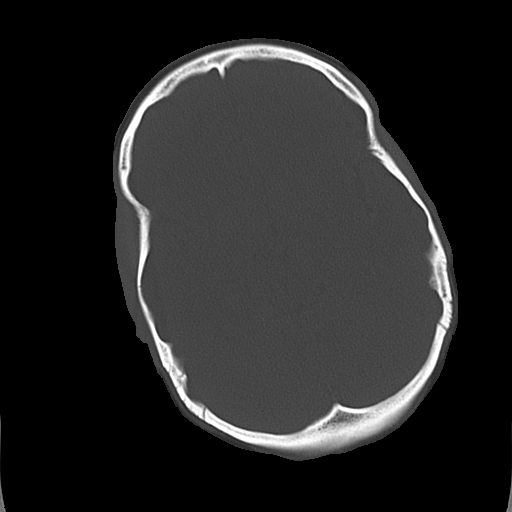

[16 of 30 positions shown; findings below may reference images not displayed]

FINDINGS: The ventricles and sulci appear symmetrical.  No mass
effect or midline shift.  No abnormal extra-axial fluid
collections.  Gray-white matter junctions are distinct.  Basal
cisterns are not effaced.  No acute intracranial hemorrhage.
Calcification of the pineal gland is stable.  No depressed skull
fractures.  Mucosal membrane thickening in the maxillary antra and
ethmoid air cells.  Mastoid air cells are not opacified.
IMPRESSION: No acute intracranial abnormalities.

## 2013-04-04 ENCOUNTER — Encounter (HOSPITAL_BASED_OUTPATIENT_CLINIC_OR_DEPARTMENT_OTHER): Payer: Self-pay

## 2013-04-04 ENCOUNTER — Emergency Department (HOSPITAL_BASED_OUTPATIENT_CLINIC_OR_DEPARTMENT_OTHER)
Admission: EM | Admit: 2013-04-04 | Discharge: 2013-04-04 | Disposition: A | Discharge status: Home / Self Care | Payer: Self-pay | Attending: Emergency Medicine | Admitting: Emergency Medicine

## 2013-04-04 DIAGNOSIS — Z79899 Other long term (current) drug therapy: Secondary | ICD-10-CM | POA: Insufficient documentation

## 2013-04-04 DIAGNOSIS — I10 Essential (primary) hypertension: Secondary | ICD-10-CM | POA: Insufficient documentation

## 2013-04-04 DIAGNOSIS — Z8719 Personal history of other diseases of the digestive system: Secondary | ICD-10-CM | POA: Insufficient documentation

## 2013-04-04 DIAGNOSIS — Z8679 Personal history of other diseases of the circulatory system: Secondary | ICD-10-CM | POA: Insufficient documentation

## 2013-04-04 DIAGNOSIS — J45909 Unspecified asthma, uncomplicated: Secondary | ICD-10-CM | POA: Insufficient documentation

## 2013-04-04 DIAGNOSIS — M62838 Other muscle spasm: Secondary | ICD-10-CM | POA: Insufficient documentation

## 2013-04-04 DIAGNOSIS — Z87828 Personal history of other (healed) physical injury and trauma: Secondary | ICD-10-CM | POA: Insufficient documentation

## 2013-04-04 DIAGNOSIS — G43909 Migraine, unspecified, not intractable, without status migrainosus: Secondary | ICD-10-CM | POA: Insufficient documentation

## 2013-04-04 DIAGNOSIS — Z8659 Personal history of other mental and behavioral disorders: Secondary | ICD-10-CM | POA: Insufficient documentation

## 2013-04-04 DIAGNOSIS — R112 Nausea with vomiting, unspecified: Secondary | ICD-10-CM | POA: Insufficient documentation

## 2013-04-04 DIAGNOSIS — Z8742 Personal history of other diseases of the female genital tract: Secondary | ICD-10-CM | POA: Insufficient documentation

## 2013-04-04 DIAGNOSIS — F17200 Nicotine dependence, unspecified, uncomplicated: Secondary | ICD-10-CM | POA: Insufficient documentation

## 2013-04-04 MED ORDER — DIAZEPAM 5 MG/ML IJ SOLN
2.5000 mg | Freq: Once | INTRAMUSCULAR | Status: AC
  Administered 2013-04-04: 2.5 mg via INTRAVENOUS
  Filled 2013-04-04: qty 2

## 2013-04-04 MED ORDER — DIPHENHYDRAMINE HCL 50 MG/ML IJ SOLN
25.0000 mg | Freq: Once | INTRAMUSCULAR | Status: AC
  Administered 2013-04-04: 25 mg via INTRAVENOUS
  Filled 2013-04-04: qty 1

## 2013-04-04 MED ORDER — SODIUM CHLORIDE 0.9 % IV BOLUS (SEPSIS)
1000.0000 mL | Freq: Once | INTRAVENOUS | Status: AC
  Administered 2013-04-04: 1000 mL via INTRAVENOUS

## 2013-04-04 MED ORDER — METOCLOPRAMIDE HCL 5 MG/ML IJ SOLN
10.0000 mg | Freq: Once | INTRAMUSCULAR | Status: AC
  Administered 2013-04-04: 10 mg via INTRAVENOUS
  Filled 2013-04-04: qty 2

## 2013-04-04 MED ORDER — KETOROLAC TROMETHAMINE 30 MG/ML IJ SOLN
30.0000 mg | Freq: Once | INTRAMUSCULAR | Status: AC
  Administered 2013-04-04: 30 mg via INTRAVENOUS
  Filled 2013-04-04: qty 1

## 2013-04-04 MED ORDER — CYCLOBENZAPRINE HCL 5 MG PO TABS: 5.0000 mg | ORAL_TABLET | Freq: Two times a day (BID) | ORAL | Status: DC | PRN

## 2013-04-04 NOTE — ED Provider Notes (Signed)
Medical screening examination/treatment/procedure(s) were performed by non-physician practitioner and as supervising physician I was immediately available for consultation/collaboration.   Jadrien Narine, MD 04/04/13 1808 

## 2013-04-04 NOTE — ED Provider Notes (Signed)
History     CSN: [id number]  Arrival date & time 04/04/13  1428   First MD Initiated Contact with Patient 04/04/13 1430      Chief Complaint  Patient presents with  . Headache    (Consider location/radiation/quality/duration/timing/severity/associated sxs/prior treatment) HPI  Patient presents to the ED with complaints of migraine and neck muscle spasm .She says she has been getting migraines for years ever since she had a head injury years ago. This headache is the same but has been persisting for 4 days now with 1 episode of vomiting and nausea. Her neck muscles she feels are tenser because she has been stressed out. No injury. No weakness, fevers, numbness, confusion, change in vision. + phonophobia and photophobia. nad vss  Past Medical History  Diagnosis Date  . Bipolar 1 disorder, depressed, mild   . Migraines   . GERD (gastroesophageal reflux disease)   . Bilateral ovarian cysts   . Asthma   . Pregnancy induced hypertension   . HTN (hypertension)     Past Surgical History  Procedure Laterality Date  . Cesarean section    . Induced abortion    . Tubal ligation      No family history on file.  History  Substance Use Topics  . Smoking status: Current Some Day Smoker -- 0.50 packs/day    Types: Cigarettes  . Smokeless tobacco: Never Used  . Alcohol Use: Yes     Comment: every other week    OB History   Grav Para Term Preterm Abortions TAB SAB Ect Mult Living   3 2 1 1 1 1    2       Review of Systems  Gastrointestinal: Positive for nausea and vomiting.  Neurological: Positive for headaches.  All other systems reviewed and are negative.    Allergies  Prednisone  Home Medications   Current Outpatient Rx  Name  Route  Sig  Dispense  Refill  . albuterol (PROVENTIL,VENTOLIN) 90 MCG/ACT inhaler   Inhalation   Inhale 2 puffs into the lungs every 6 (six) hours as needed. Shortness of breath          . ondansetron (ZOFRAN ODT) 8 MG disintegrating  tablet   Oral   Take 1 tablet (8 mg total) by mouth every 8 (eight) hours as needed for nausea.   12 tablet   0     BP 125/91  Pulse 105  Temp(Src) 99.6 F (37.6 C) (Oral)  Resp 20  Ht 5' (1.524 m)  Wt 160 lb (72.576 kg)  BMI 31.25 kg/m2  SpO2 96%  LMP 03/02/2013  Physical Exam  Constitutional: She is oriented to person, place, and time.  Neck: Muscular tenderness present. No spinous process tenderness present. No rigidity. Normal range of motion present. No Brudzinski's sign and no Kernig's sign noted.    Neurological: She is oriented to person, place, and time. She has normal strength.  Neurologic exam:  Speech clear, pupils equal round reactive to light, extraocular movements intact   Normal peripheral visual fields Cranial nerves III through XII normal including no facial droop Follows commands, moves all extremities x4, normal strength to bilateral upper and lower extremities at all major muscle groups including grip Sensation normal to light touch and pinprick Coordination intact, no limb ataxia, finger-nose-finger normal Rapid alternating movements normal No pronator drift Gait normal       ED Course  Procedures (including critical care time)  Labs Reviewed - No data to display No results found.  No diagnosis found.  Dx: headache  MDM  Pt given IV fluids/1L, 10mg  IV reglan, benadryl 25mg  IV, and 2.5mg  Valium.  I went back to assess patient 1 hour later and she was sleeping. I woke her up to check on her and she easily aroused to verbal stimuli and then said her head still hurt but she was not nauseous anymore. No more neck pain either.  Gave 30mg  IV Toradol then re-eval.    5:13pm- Headache has completely resolved. Patient says she feels much better. Will give Rx for flexeril and have hre fu with her pcp.   Pt has been advised of the symptoms that warrant their return to the ED. Patient has voiced understanding and has agreed to follow-up with the  PCP or specialist.      Dorthula Matas, PA-C 04/04/13 1713

## 2013-04-04 NOTE — ED Notes (Signed)
C/o HA, "pain to my spine and behind my right ear" x 4 days

## 2013-04-04 NOTE — ED Notes (Signed)
Report received from [facility]. Assuming care of at this time

## 2013-06-08 ENCOUNTER — Inpatient Hospital Stay (HOSPITAL_COMMUNITY): Payer: Self-pay

## 2013-06-08 ENCOUNTER — Encounter (HOSPITAL_COMMUNITY): Payer: Self-pay | Admitting: *Deleted

## 2013-06-08 ENCOUNTER — Inpatient Hospital Stay (HOSPITAL_COMMUNITY)
Admission: AD | Admit: 2013-06-08 | Discharge: 2013-06-08 | Disposition: A | Discharge status: Home / Self Care | Payer: Self-pay | Source: Ambulatory Visit | Attending: Obstetrics & Gynecology | Admitting: Obstetrics & Gynecology

## 2013-06-08 DIAGNOSIS — D259 Leiomyoma of uterus, unspecified: Secondary | ICD-10-CM | POA: Insufficient documentation

## 2013-06-08 DIAGNOSIS — N946 Dysmenorrhea, unspecified: Secondary | ICD-10-CM | POA: Insufficient documentation

## 2013-06-08 DIAGNOSIS — R1031 Right lower quadrant pain: Secondary | ICD-10-CM | POA: Insufficient documentation

## 2013-06-08 LAB — POCT PREGNANCY, URINE: Preg Test, Ur: NEGATIVE

## 2013-06-08 LAB — URINALYSIS, ROUTINE W REFLEX MICROSCOPIC
Glucose, UA: NEGATIVE mg/dL
Ketones, ur: NEGATIVE mg/dL
Leukocytes, UA: NEGATIVE
Nitrite: NEGATIVE
Specific Gravity, Urine: >1.03 — ABNORMAL HIGH (ref 1.005–1.030)
pH: 6 (ref 5.0–8.0)

## 2013-06-08 LAB — WET PREP, GENITAL
Clue Cells Wet Prep HPF POC: NONE SEEN
Trich, Wet Prep: NONE SEEN
Yeast Wet Prep HPF POC: NONE SEEN

## 2013-06-08 LAB — CBC
HCT: 39.6 % (ref 36.0–46.0)
Hemoglobin: 13.6 g/dL (ref 12.0–15.0)
MCHC: 34.3 g/dL (ref 30.0–36.0)
RBC: 4.34 MIL/uL (ref 3.87–5.11)
WBC: 6.9 10*3/uL (ref 4.0–10.5)

## 2013-06-08 LAB — URINE MICROSCOPIC-ADD ON

## 2013-06-08 MED ORDER — KETOROLAC TROMETHAMINE 60 MG/2ML IM SOLN
60.0000 mg | Freq: Once | INTRAMUSCULAR | Status: AC
  Administered 2013-06-08: 60 mg via INTRAMUSCULAR
  Filled 2013-06-08: qty 2

## 2013-06-08 MED ORDER — IBUPROFEN 200 MG PO TABS: 800.0000 mg | ORAL_TABLET | Freq: Three times a day (TID) | ORAL | Status: DC | PRN

## 2013-06-08 MED ORDER — OXYCODONE-ACETAMINOPHEN 5-325 MG PO TABS: 1.0000 | ORAL_TABLET | ORAL | Status: DC | PRN

## 2013-06-08 MED ORDER — HYDROMORPHONE HCL PF 1 MG/ML IJ SOLN
1.0000 mg | Freq: Once | INTRAMUSCULAR | Status: AC
  Administered 2013-06-08: 1 mg via INTRAVENOUS
  Filled 2013-06-08: qty 1

## 2013-06-08 NOTE — MAU Note (Signed)
Pt presents with pain in her abdomen and her right side with pain radiating down in her right leg that started early this am.

## 2013-06-08 NOTE — MAU Provider Note (Signed)
History     CSN: [id number]  Arrival date and time: 06/08/13 1046   None     Chief Complaint  Patient presents with  . Abdominal Pain   HPI 37 y.o. [id number] with RLQ pain radiating down right leg. Started with cramping in "cervix area" this morning, then stabbing pain once she got up and started moving around. Worse with "stretching out or laying down" better with sitting up. Spotting today, no discharge. Patient's last menstrual period was 05/04/2013.   Past Medical History  Diagnosis Date  . Bipolar 1 disorder, depressed, mild   . Migraines   . GERD (gastroesophageal reflux disease)   . Bilateral ovarian cysts   . Asthma   . Pregnancy induced hypertension   . HTN (hypertension)     Past Surgical History  Procedure Laterality Date  . Cesarean section    . Induced abortion    . Tubal ligation      History reviewed. No pertinent family history.  History  Substance Use Topics  . Smoking status: Current Some Day Smoker -- 0.50 packs/day    Types: Cigarettes  . Smokeless tobacco: Never Used  . Alcohol Use: Yes     Comment: every other week    Allergies:  Allergies  Allergen Reactions  . Prednisone Swelling and Rash    Leg swells up; pt reports I.V use with no problems    Prescriptions prior to admission  Medication Sig Dispense Refill  . albuterol (PROVENTIL,VENTOLIN) 90 MCG/ACT inhaler Inhale 2 puffs into the lungs every 6 (six) hours as needed. Shortness of breath       . [DISCONTINUED] acetaminophen (TYLENOL) 325 MG tablet Take 650 mg by mouth every 6 (six) hours as needed for pain.      . [DISCONTINUED] ibuprofen (ADVIL,MOTRIN) 200 MG tablet Take 400 mg by mouth every 6 (six) hours as needed for pain.        Review of Systems  Gastrointestinal: Positive for nausea. Negative for vomiting, diarrhea and constipation.  Genitourinary: Negative for dysuria.   Physical Exam   Blood pressure 139/92, pulse 88, temperature 98.1 F (36.7 C), resp. rate 18, last  menstrual period 05/04/2013.  Physical Exam  Nursing note and vitals reviewed. Constitutional: She is oriented to person, place, and time. She appears well-developed and well-nourished. She appears distressed (appears in pain).  Cardiovascular: Normal rate.   Respiratory: Effort normal.  GI: Soft. She exhibits no distension and no mass. There is no tenderness. There is no rebound and no guarding.  Musculoskeletal: Normal range of motion.  Neurological: She is alert and oriented to person, place, and time.  Skin: Skin is warm and dry.  Psychiatric: She has a normal mood and affect.    MAU Course  Procedures  Results for orders placed during the hospital encounter of 06/08/13 (from the past 24 hour(s))  URINALYSIS, ROUTINE W REFLEX MICROSCOPIC     Status: Abnormal   Collection Time    06/08/13 11:10 AM      Result Value Range   Color, Urine YELLOW  YELLOW   APPearance CLEAR  CLEAR   Specific Gravity, Urine >1.030 (*) 1.005 - 1.030   pH 6.0  5.0 - 8.0   Glucose, UA NEGATIVE  NEGATIVE mg/dL   Hgb urine dipstick LARGE (*) NEGATIVE   Bilirubin Urine NEGATIVE  NEGATIVE   Ketones, ur NEGATIVE  NEGATIVE mg/dL   Protein, ur NEGATIVE  NEGATIVE mg/dL   Urobilinogen, UA 0.2  0.0 - 1.0 mg/dL  Nitrite NEGATIVE  NEGATIVE   Leukocytes, UA NEGATIVE  NEGATIVE  URINE MICROSCOPIC-ADD ON     Status: Abnormal   Collection Time    06/08/13 11:10 AM      Result Value Range   Squamous Epithelial / LPF FEW (*) RARE   WBC, UA 0-2  <3 WBC/hpf   RBC / HPF 7-10  <3 RBC/hpf  POCT PREGNANCY, URINE     Status: None   Collection Time    06/08/13 11:26 AM      Result Value Range   Preg Test, Ur NEGATIVE  NEGATIVE  CBC     Status: None   Collection Time    06/08/13 11:40 AM      Result Value Range   WBC 6.9  4.0 - 10.5 K/uL   RBC 4.34  3.87 - 5.11 MIL/uL   Hemoglobin 13.6  12.0 - 15.0 g/dL   HCT 65.7  84.6 - 96.2 %   MCV 91.2  78.0 - 100.0 fL   MCH 31.3  26.0 - 34.0 pg   MCHC 34.3  30.0 - 36.0  g/dL   RDW 95.2  84.1 - 32.4 %   Platelets 360  150 - 400 K/uL  WET PREP, GENITAL     Status: Abnormal   Collection Time    06/08/13 12:15 PM      Result Value Range   Yeast Wet Prep HPF POC NONE SEEN  NONE SEEN   Trich, Wet Prep NONE SEEN  NONE SEEN   Clue Cells Wet Prep HPF POC NONE SEEN  NONE SEEN   WBC, Wet Prep HPF POC RARE (*) NONE SEEN   US Transvaginal Non-ob  06/08/2013   *RADIOLOGY REPORT*  Clinical Data: Right adnexa pain  TRANSABDOMINAL AND TRANSVAGINAL ULTRASOUND OF PELVIS  Technique:  Both transabdominal and transvaginal ultrasound examinations of the pelvis were performed.  Transabdominal technique was performed for global imaging of the pelvis including uterus, ovaries, adnexal regions, and pelvic cul-de-sac.  It was necessary to proceed with endovaginal exam following the transabdominal exam to visualize the ovaries and endometrium.  Comparison:  None.  Findings: Uterus:  Measures 8.5 x 4.5 x 5.1 cm.  There is a small intramural fibroid within the uterine fundus measuring 9 x 7 x 9 mm.  Small intramural fibroid within the posterior myometrium measures 9 x 5 x 9 mm.  Endometrium: Normal in thickness and appearance.  Measures 4 mm.  Right ovary: Normal appearance/no adnexal mass.  Measures 3.4 x 2.0 x 2.8 cm.  Left ovary: Normal appearance/no adnexal mass. Measures 3.4 x 2.0 x 2.6 cm.  Other Findings:  No free fluid  IMPRESSION:  1.  No acute findings. 2.  Small intramural fibroids identified.   Original Report Authenticated By: Signa Kell, M.D.   US Pelvis Complete  06/08/2013   *RADIOLOGY REPORT*  Clinical Data: Right adnexa pain  TRANSABDOMINAL AND TRANSVAGINAL ULTRASOUND OF PELVIS  Technique:  Both transabdominal and transvaginal ultrasound examinations of the pelvis were performed.  Transabdominal technique was performed for global imaging of the pelvis including uterus, ovaries, adnexal regions, and pelvic cul-de-sac.  It was necessary to proceed with endovaginal exam  following the transabdominal exam to visualize the ovaries and endometrium.  Comparison:  None.  Findings: Uterus:  Measures 8.5 x 4.5 x 5.1 cm.  There is a small intramural fibroid within the uterine fundus measuring 9 x 7 x 9 mm.  Small intramural fibroid within the posterior myometrium measures 9 x 5 x  9 mm.  Endometrium: Normal in thickness and appearance.  Measures 4 mm.  Right ovary: Normal appearance/no adnexal mass.  Measures 3.4 x 2.0 x 2.8 cm.  Left ovary: Normal appearance/no adnexal mass. Measures 3.4 x 2.0 x 2.6 cm.  Other Findings:  No free fluid  IMPRESSION:  1.  No acute findings. 2.  Small intramural fibroids identified.   Original Report Authenticated By: Signa Kell, M.D.   No pain relief with Toradol 60 mg IM. Good relief with Dilaudid 1 mg IM.   Assessment and Plan   1. Dysmenorrhea   2. Fibroid uterus   RLQ pain likely r/t dysmenorrhea. Appy seems unlikely considering no tenderness on abd exam, no vomiting, no elevated WBC or fever. Pt instructed to F/U in regular ED if new symptoms develop. May f/u in GYN clinic for ongoing concerns with menses.  Motrin 800 mg q 8 h and Percocet 5/325 #10 given for pain control at home.     Medication List    STOP taking these medications       acetaminophen 325 MG tablet  Commonly known as:  TYLENOL      TAKE these medications       albuterol 90 MCG/ACT inhaler  Commonly known as:  PROVENTIL,VENTOLIN  Inhale 2 puffs into the lungs every 6 (six) hours as needed. Shortness of breath     ibuprofen 200 MG tablet  Commonly known as:  ADVIL,MOTRIN  Take 4 tablets (800 mg total) by mouth every 8 (eight) hours as needed for pain.     oxyCODONE-acetaminophen 5-325 MG per tablet  Commonly known as:  PERCOCET/ROXICET  Take 1 tablet by mouth every 4 (four) hours as needed for pain.            Follow-up Information   Follow up with [facility]. (As needed)    Contact information:   [address] [place]  [address] [phone]        [name] 06/08/2013, 2:32 PM

## 2013-06-09 LAB — GC/CHLAMYDIA PROBE AMP
CT Probe RNA: NEGATIVE
GC Probe RNA: NEGATIVE

## 2013-07-25 ENCOUNTER — Emergency Department (HOSPITAL_COMMUNITY)
Admission: EM | Admit: 2013-07-25 | Discharge: 2013-07-25 | Disposition: A | Discharge status: Home / Self Care | Payer: Self-pay | Attending: Emergency Medicine | Admitting: Emergency Medicine

## 2013-07-25 ENCOUNTER — Encounter (HOSPITAL_COMMUNITY): Payer: Self-pay | Admitting: *Deleted

## 2013-07-25 DIAGNOSIS — I10 Essential (primary) hypertension: Secondary | ICD-10-CM | POA: Insufficient documentation

## 2013-07-25 DIAGNOSIS — Z8719 Personal history of other diseases of the digestive system: Secondary | ICD-10-CM | POA: Insufficient documentation

## 2013-07-25 DIAGNOSIS — Z8679 Personal history of other diseases of the circulatory system: Secondary | ICD-10-CM | POA: Insufficient documentation

## 2013-07-25 DIAGNOSIS — R102 Pelvic and perineal pain: Secondary | ICD-10-CM

## 2013-07-25 DIAGNOSIS — Z8659 Personal history of other mental and behavioral disorders: Secondary | ICD-10-CM | POA: Insufficient documentation

## 2013-07-25 DIAGNOSIS — N938 Other specified abnormal uterine and vaginal bleeding: Secondary | ICD-10-CM

## 2013-07-25 DIAGNOSIS — N898 Other specified noninflammatory disorders of vagina: Secondary | ICD-10-CM | POA: Insufficient documentation

## 2013-07-25 DIAGNOSIS — Z79899 Other long term (current) drug therapy: Secondary | ICD-10-CM | POA: Insufficient documentation

## 2013-07-25 DIAGNOSIS — N939 Abnormal uterine and vaginal bleeding, unspecified: Secondary | ICD-10-CM | POA: Insufficient documentation

## 2013-07-25 DIAGNOSIS — F17200 Nicotine dependence, unspecified, uncomplicated: Secondary | ICD-10-CM | POA: Insufficient documentation

## 2013-07-25 DIAGNOSIS — Z3202 Encounter for pregnancy test, result negative: Secondary | ICD-10-CM | POA: Insufficient documentation

## 2013-07-25 DIAGNOSIS — J45909 Unspecified asthma, uncomplicated: Secondary | ICD-10-CM | POA: Insufficient documentation

## 2013-07-25 DIAGNOSIS — N926 Irregular menstruation, unspecified: Secondary | ICD-10-CM | POA: Insufficient documentation

## 2013-07-25 DIAGNOSIS — N949 Unspecified condition associated with female genital organs and menstrual cycle: Secondary | ICD-10-CM | POA: Insufficient documentation

## 2013-07-25 DIAGNOSIS — Z8742 Personal history of other diseases of the female genital tract: Secondary | ICD-10-CM | POA: Insufficient documentation

## 2013-07-25 DIAGNOSIS — Z9851 Tubal ligation status: Secondary | ICD-10-CM | POA: Insufficient documentation

## 2013-07-25 LAB — URINALYSIS, ROUTINE W REFLEX MICROSCOPIC
Specific Gravity, Urine: 1.031 — ABNORMAL HIGH (ref 1.005–1.030)
Urobilinogen, UA: 1 mg/dL (ref 0.0–1.0)
pH: 5.5 (ref 5.0–8.0)

## 2013-07-25 LAB — POCT I-STAT, CHEM 8
Chloride: 105 mEq/L (ref 96–112)
HCT: 40 % (ref 36.0–46.0)
Potassium: 3.5 mEq/L (ref 3.5–5.1)

## 2013-07-25 LAB — URINE MICROSCOPIC-ADD ON

## 2013-07-25 MED ORDER — HYDROCODONE-ACETAMINOPHEN 5-325 MG PO TABS: 2.0000 | ORAL_TABLET | ORAL | Status: DC | PRN

## 2013-07-25 MED ORDER — OXYCODONE-ACETAMINOPHEN 5-325 MG PO TABS
2.0000 | ORAL_TABLET | Freq: Once | ORAL | Status: AC
  Administered 2013-07-25: 2 via ORAL
  Filled 2013-07-25: qty 2

## 2013-07-25 NOTE — Progress Notes (Signed)
Patient confirms that she does not have a pcp or insurance.  [facility] provided patient with a list of pcps who accept self pay patients, list of discount pharmacies and website needymeds.com for medication assistance, information on Affordable Care Act and Medicaid for insurance,  and list of community financial assistance such as [employer] and local churches.  Strongly encouraged patient to find a pcp.  Patient verbalized understanding and was thankful for resources.  No further needs at this time.

## 2013-07-25 NOTE — ED Notes (Signed)
Pt states that she began to have RL abd pain that began last pm; pt states that she just finished her menstrual cycle on 7/28 and is having vaginal bleeding again; pt c/o nausea but denies vomiting

## 2013-07-25 NOTE — ED Provider Notes (Signed)
CSN: [id number]     Arrival date & time 07/25/13  2010 History     First MD Initiated Contact with Patient 07/25/13 2041     Chief Complaint  Patient presents with  . Abdominal Pain  . Vaginal Bleeding   (Consider location/radiation/quality/duration/timing/severity/associated sxs/prior Treatment) Patient is a 37 y.o. female presenting with vaginal bleeding. The history is provided by the patient. No language interpreter was used.  Vaginal Bleeding Quality:  Typical of menses and dark red Severity:  Moderate Onset quality:  Sudden Duration:  1 day Timing:  Constant Progression:  Unchanged Chronicity:  New Menstrual history:  Regular Possible pregnancy: no   Context: at rest   Relieved by:  Acetaminophen Worsened by:  Nothing tried Ineffective treatments:  None tried Associated symptoms: abdominal pain   Associated symptoms: no back pain, no dizziness, no dysuria, no fatigue, no fever and no nausea   Abdominal pain:    Pain location: R pelvic.   Quality:  Aching   Severity:  Moderate   Onset quality:  Gradual   Duration:  1 day   Timing:  Constant   Progression:  Unchanged   Chronicity:  New Risk factors: hx of endometriosis, gynecological surgery and ovarian cysts   Risk factors: no ovarian torsion, no STD and no STD exposure     Past Medical History  Diagnosis Date  . Bipolar 1 disorder, depressed, mild   . Migraines   . GERD (gastroesophageal reflux disease)   . Bilateral ovarian cysts   . Asthma   . Pregnancy induced hypertension   . HTN (hypertension)    Past Surgical History  Procedure Laterality Date  . Cesarean section    . Induced abortion    . Tubal ligation     No family history on file. History  Substance Use Topics  . Smoking status: Current Some Day Smoker -- 0.50 packs/day    Types: Cigarettes  . Smokeless tobacco: Never Used  . Alcohol Use: Yes     Comment: every other week   OB History   Grav Para Term Preterm Abortions TAB SAB Ect  Mult Living   3 2 1 1 1 1    2      Review of Systems  Constitutional: Negative for fever, chills, diaphoresis, activity change, appetite change and fatigue.  HENT: Negative for congestion, sore throat, facial swelling, rhinorrhea, neck pain and neck stiffness.   Eyes: Negative for photophobia and discharge.  Respiratory: Negative for cough, chest tightness and shortness of breath.   Cardiovascular: Negative for chest pain, palpitations and leg swelling.  Gastrointestinal: Positive for abdominal pain. Negative for nausea, vomiting and diarrhea.  Endocrine: Negative for polydipsia and polyuria.  Genitourinary: Positive for vaginal bleeding. Negative for dysuria, frequency, difficulty urinating and pelvic pain.  Musculoskeletal: Negative for back pain and arthralgias.  Skin: Negative for color change and wound.  Allergic/Immunologic: Negative for immunocompromised state.  Neurological: Negative for dizziness, facial asymmetry, weakness, numbness and headaches.  Hematological: Does not bruise/bleed easily.  Psychiatric/Behavioral: Negative for confusion and agitation.    Allergies  Prednisone  Home Medications   Current Outpatient Rx  Name  Route  Sig  Dispense  Refill  . albuterol (PROVENTIL,VENTOLIN) 90 MCG/ACT inhaler   Inhalation   Inhale 2 puffs into the lungs every 6 (six) hours as needed for wheezing or shortness of breath.          Marland Kitchen ibuprofen (ADVIL,MOTRIN) 200 MG tablet   Oral   Take 200-600 mg by  mouth every 8 (eight) hours as needed for pain.         Marland Kitchen HYDROcodone-acetaminophen (NORCO) 5-325 MG per tablet   Oral   Take 2 tablets by mouth every 4 (four) hours as needed for pain.   10 tablet   0    BP 124/75  Pulse 96  Temp(Src) 98.9 F (37.2 C) (Oral)  Resp 14  Ht 5' (1.524 m)  Wt 150 lb 4 oz (68.153 kg)  BMI 29.34 kg/m2  SpO2 98%  LMP 07/11/2013 Physical Exam  Constitutional: She is oriented to person, place, and time. She appears well-developed and  well-nourished. No distress.  HENT:  Head: Normocephalic and atraumatic.  Mouth/Throat: No oropharyngeal exudate.  Eyes: Pupils are equal, round, and reactive to light.  Neck: Normal range of motion. Neck supple.  Cardiovascular: Normal rate, regular rhythm and normal heart sounds.  Exam reveals no gallop and no friction rub.   No murmur heard. Pulmonary/Chest: Effort normal and breath sounds normal. No respiratory distress. She has no wheezes. She has no rales.  Abdominal: Soft. Bowel sounds are normal. She exhibits no distension and no mass. There is tenderness in the right lower quadrant. There is no rigidity, no rebound, no guarding and no CVA tenderness.  Genitourinary: Cervix exhibits no motion tenderness and no friability. Right adnexum displays no tenderness and no fullness. Left adnexum displays no tenderness and no fullness.  Moderate amt dark blood in vault, from os  Musculoskeletal: Normal range of motion. She exhibits no edema and no tenderness.  Neurological: She is alert and oriented to person, place, and time.  Skin: Skin is warm and dry.  Psychiatric: She has a normal mood and affect.    ED Course   Procedures (including critical care time)  Labs Reviewed  WET PREP, GENITAL - Abnormal; Notable for the following:    WBC, Wet Prep HPF POC MODERATE (*)    All other components within normal limits  URINALYSIS, ROUTINE W REFLEX MICROSCOPIC - Abnormal; Notable for the following:    APPearance CLOUDY (*)    Specific Gravity, Urine 1.031 (*)    Hgb urine dipstick LARGE (*)    Bilirubin Urine SMALL (*)    Leukocytes, UA TRACE (*)    All other components within normal limits  URINE MICROSCOPIC-ADD ON - Abnormal; Notable for the following:    Bacteria, UA MANY (*)    All other components within normal limits  GC/CHLAMYDIA PROBE AMP  POCT I-STAT, CHEM 8   No results found. 1. Pelvic pain   2. Dysfunctional uterine bleeding    11:00 PM Pt feeling improved after PO pain  meds.  Hb stable, pelvic exam unremarkable except for moderate blood from Os.  UA not c/w infection.   MDM   Pt is a 37 y.o. female with Pmhx as above including complex gynecologic hx who presents with RLQ pelvic pain and vaginal bleeding for about 15 hours.  Pain is similar in location and quality of her typical menstual pain, though the pain is not as severe as usual.  LMP about 2 weeks ago.  No fever, n/v, d/a.  +ttp RLQ w/o rebound or guarding.  Pelvic exam remarkable only for moderate blood from os.  Urine not infected, Hb stable, lytes nml.  I believe pt having anovulatory bleeding, doubt ovarian torsion, PID.  Will treat as outpt w/ short course of PO narcotics as needed.  Pt will also estab with gyn as outpt.  Return precautions given for  new or worsening symptoms   1. Pelvic pain   2. Dysfunctional uterine bleeding        Shanna Cisco, MD 07/26/13 [phone]

## 2013-07-26 LAB — GC/CHLAMYDIA PROBE AMP
CT Probe RNA: NEGATIVE
GC Probe RNA: NEGATIVE

## 2013-09-01 ENCOUNTER — Inpatient Hospital Stay (HOSPITAL_COMMUNITY)
Admission: EM | Admit: 2013-09-01 | Discharge: 2013-09-03 | DRG: 202 | Disposition: A | Discharge status: Home / Self Care | Payer: Self-pay | Attending: Internal Medicine | Admitting: Internal Medicine

## 2013-09-01 ENCOUNTER — Emergency Department (HOSPITAL_COMMUNITY): Payer: Self-pay

## 2013-09-01 ENCOUNTER — Encounter (HOSPITAL_COMMUNITY): Payer: Self-pay | Admitting: *Deleted

## 2013-09-01 DIAGNOSIS — Z79899 Other long term (current) drug therapy: Secondary | ICD-10-CM

## 2013-09-01 DIAGNOSIS — F3131 Bipolar disorder, current episode depressed, mild: Secondary | ICD-10-CM | POA: Diagnosis present

## 2013-09-01 DIAGNOSIS — F17200 Nicotine dependence, unspecified, uncomplicated: Secondary | ICD-10-CM | POA: Diagnosis present

## 2013-09-01 DIAGNOSIS — Z720 Tobacco use: Secondary | ICD-10-CM | POA: Diagnosis present

## 2013-09-01 DIAGNOSIS — J45901 Unspecified asthma with (acute) exacerbation: Principal | ICD-10-CM | POA: Diagnosis present

## 2013-09-01 DIAGNOSIS — K219 Gastro-esophageal reflux disease without esophagitis: Secondary | ICD-10-CM | POA: Diagnosis present

## 2013-09-01 DIAGNOSIS — I10 Essential (primary) hypertension: Secondary | ICD-10-CM | POA: Diagnosis present

## 2013-09-01 LAB — CBC WITH DIFFERENTIAL/PLATELET
Eosinophils Absolute: 0.3 10*3/uL (ref 0.0–0.7)
Eosinophils Relative: 4 % (ref 0–5)
HCT: 38.1 % (ref 36.0–46.0)
Lymphs Abs: 2.1 10*3/uL (ref 0.7–4.0)
MCH: 31.3 pg (ref 26.0–34.0)
MCV: 90.3 fL (ref 78.0–100.0)
Monocytes Absolute: 0.6 10*3/uL (ref 0.1–1.0)
Platelets: 316 10*3/uL (ref 150–400)
RBC: 4.22 MIL/uL (ref 3.87–5.11)

## 2013-09-01 LAB — BASIC METABOLIC PANEL
BUN: 6 mg/dL (ref 6–23)
Calcium: 9.1 mg/dL (ref 8.4–10.5)
Creatinine, Ser: 0.73 mg/dL (ref 0.50–1.10)
GFR calc non Af Amer: >90 mL/min (ref >90)
Glucose, Bld: 92 mg/dL (ref 70–99)
Sodium: 138 mEq/L (ref 135–145)

## 2013-09-01 MED ORDER — ENOXAPARIN SODIUM 40 MG/0.4ML ~~LOC~~ SOLN
40.0000 mg | Freq: Every day | SUBCUTANEOUS | Status: DC
  Administered 2013-09-02 (×2): 40 mg via SUBCUTANEOUS
  Filled 2013-09-01 (×3): qty 0.4

## 2013-09-01 MED ORDER — ONDANSETRON HCL 4 MG/2ML IJ SOLN: 4.0000 mg | Freq: Four times a day (QID) | INTRAMUSCULAR | Status: DC | PRN

## 2013-09-01 MED ORDER — IPRATROPIUM BROMIDE 0.02 % IN SOLN
1.5000 mg | Freq: Once | RESPIRATORY_TRACT | Status: AC
  Administered 2013-09-01: 0.5 mg via RESPIRATORY_TRACT
  Filled 2013-09-01: qty 7.5

## 2013-09-01 MED ORDER — ALBUTEROL (5 MG/ML) CONTINUOUS INHALATION SOLN
10.0000 mg/h | INHALATION_SOLUTION | Freq: Once | RESPIRATORY_TRACT | Status: AC
  Administered 2013-09-01: 10 mg/h via RESPIRATORY_TRACT
  Filled 2013-09-01: qty 20

## 2013-09-01 MED ORDER — ALBUTEROL (5 MG/ML) CONTINUOUS INHALATION SOLN: 10.0000 mg/h | INHALATION_SOLUTION | RESPIRATORY_TRACT | Status: DC | PRN

## 2013-09-01 MED ORDER — ONDANSETRON HCL 4 MG PO TABS: 4.0000 mg | ORAL_TABLET | Freq: Four times a day (QID) | ORAL | Status: DC | PRN

## 2013-09-01 MED ORDER — METHYLPREDNISOLONE SODIUM SUCC 125 MG IJ SOLR
125.0000 mg | Freq: Once | INTRAMUSCULAR | Status: AC
  Administered 2013-09-01: 125 mg via INTRAVENOUS
  Filled 2013-09-01: qty 2

## 2013-09-01 MED ORDER — METHYLPREDNISOLONE SODIUM SUCC 125 MG IJ SOLR
125.0000 mg | Freq: Four times a day (QID) | INTRAMUSCULAR | Status: DC
  Administered 2013-09-02 (×2): 125 mg via INTRAVENOUS
  Filled 2013-09-01 (×7): qty 2

## 2013-09-01 MED ORDER — LEVALBUTEROL HCL 1.25 MG/0.5ML IN NEBU
1.2500 mg | INHALATION_SOLUTION | Freq: Four times a day (QID) | RESPIRATORY_TRACT | Status: DC
  Administered 2013-09-02: 1.25 mg via RESPIRATORY_TRACT
  Filled 2013-09-01 (×6): qty 0.5

## 2013-09-01 MED ORDER — SODIUM CHLORIDE 0.9 % IJ SOLN
3.0000 mL | Freq: Two times a day (BID) | INTRAMUSCULAR | Status: DC
  Administered 2013-09-02: 3 mL via INTRAVENOUS
  Administered 2013-09-02: via INTRAVENOUS
  Administered 2013-09-02: 3 mL via INTRAVENOUS

## 2013-09-01 MED ORDER — SODIUM CHLORIDE 0.9 % IV SOLN
INTRAVENOUS | Status: DC
  Administered 2013-09-02 – 2013-09-03 (×2): via INTRAVENOUS

## 2013-09-01 MED ORDER — MAGNESIUM SULFATE 40 MG/ML IJ SOLN
2.0000 g | Freq: Once | INTRAMUSCULAR | Status: AC
  Administered 2013-09-01: 2 g via INTRAVENOUS
  Filled 2013-09-01: qty 50

## 2013-09-01 MED ORDER — POTASSIUM CHLORIDE CRYS ER 20 MEQ PO TBCR
40.0000 meq | EXTENDED_RELEASE_TABLET | Freq: Once | ORAL | Status: AC
  Administered 2013-09-01: 40 meq via ORAL
  Filled 2013-09-01: qty 2

## 2013-09-01 MED ORDER — LEVOFLOXACIN IN D5W 500 MG/100ML IV SOLN
500.0000 mg | INTRAVENOUS | Status: DC
  Administered 2013-09-02 (×2): 500 mg via INTRAVENOUS
  Filled 2013-09-01 (×3): qty 100

## 2013-09-01 MED ORDER — MAGNESIUM SULFATE 50 % IJ SOLN: 2.0000 g | Freq: Once | INTRAMUSCULAR | Status: DC

## 2013-09-01 NOTE — ED Notes (Signed)
Patient observed talking on phone.

## 2013-09-01 NOTE — ED Notes (Signed)
Pt reports hx of asthma, states she tried using her inhaler today but it did not help relieve sob/wheezing

## 2013-09-01 NOTE — ED Provider Notes (Signed)
CSN: [id number]     Arrival date & time 09/01/13  1541 History   First MD Initiated Contact with Patient 09/01/13 1546     Chief Complaint  Patient presents with  . Shortness of Breath  . Asthma   (Consider location/radiation/quality/duration/timing/severity/associated sxs/prior Treatment) Patient is a 37 y.o. female presenting with shortness of breath and asthma.  Shortness of Breath Severity:  Severe Onset quality:  Gradual Duration:  8 hours Timing:  Constant Progression:  Worsening Chronicity:  Recurrent Context comment:  Worsening asthma since moving to [place] 3 months ago Relieved by:  Nothing Worsened by:  Activity Ineffective treatments:  Inhaler Associated symptoms: chest pain (tight), cough (dry) and sore throat   Associated symptoms: no abdominal pain, no fever, no sputum production and no vomiting   Asthma Associated symptoms include chest pain (tight) and shortness of breath. Pertinent negatives include no abdominal pain.    Past Medical History  Diagnosis Date  . Bipolar 1 disorder, depressed, mild   . Migraines   . GERD (gastroesophageal reflux disease)   . Bilateral ovarian cysts   . Asthma   . Pregnancy induced hypertension   . HTN (hypertension)    Past Surgical History  Procedure Laterality Date  . Cesarean section    . Induced abortion    . Tubal ligation     No family history on file. History  Substance Use Topics  . Smoking status: Current Some Day Smoker -- 0.50 packs/day    Types: Cigarettes  . Smokeless tobacco: Never Used  . Alcohol Use: Yes     Comment: every other week   OB History   Grav Para Term Preterm Abortions TAB SAB Ect Mult Living   3 2 1 1 1 1    2      Review of Systems  Constitutional: Negative for fever.  HENT: Positive for sore throat. Negative for congestion.   Respiratory: Positive for cough (dry) and shortness of breath. Negative for sputum production.   Cardiovascular: Positive for chest pain (tight).   Gastrointestinal: Negative for nausea, vomiting, abdominal pain and diarrhea.  All other systems reviewed and are negative.    Allergies  Prednisone  Home Medications   Current Outpatient Rx  Name  Route  Sig  Dispense  Refill  . albuterol (PROVENTIL,VENTOLIN) 90 MCG/ACT inhaler   Inhalation   Inhale 2 puffs into the lungs every 6 (six) hours as needed for wheezing or shortness of breath.          Marland Kitchen HYDROcodone-acetaminophen (NORCO) 5-325 MG per tablet   Oral   Take 2 tablets by mouth every 4 (four) hours as needed for pain.   10 tablet   0   . ibuprofen (ADVIL,MOTRIN) 200 MG tablet   Oral   Take 200-600 mg by mouth every 8 (eight) hours as needed for pain.          BP 153/107  Pulse 129  Temp(Src) 98.2 F (36.8 C) (Oral)  Resp 30  SpO2 95%  LMP 08/24/2013 Physical Exam  Nursing note and vitals reviewed. Constitutional: She is oriented to person, place, and time. She appears well-developed and well-nourished. No distress.  HENT:  Head: Normocephalic and atraumatic.  Mouth/Throat: Oropharynx is clear and moist.  Eyes: Conjunctivae are normal. Pupils are equal, round, and reactive to light. No scleral icterus.  Neck: Neck supple.  Cardiovascular: Normal rate, regular rhythm, normal heart sounds and intact distal pulses.   No murmur heard. Pulmonary/Chest: No stridor. She is in  respiratory distress (mild). She has wheezes. She has no rales.  Abdominal: Soft. Bowel sounds are normal. She exhibits no distension. There is no tenderness.  Musculoskeletal: Normal range of motion.  Neurological: She is alert and oriented to person, place, and time.  Skin: Skin is warm and dry. No rash noted.  Psychiatric: She has a normal mood and affect. Her behavior is normal.    ED Course  CRITICAL CARE Performed by: Blake Divine DAVID Authorized by: Blake Divine DAVID Total critical care time: 45 minutes Critical care time was exclusive of separately billable procedures and  treating other patients. Critical care was necessary to treat or prevent imminent or life-threatening deterioration of the following conditions: respiratory failure. Critical care was time spent personally by me on the following activities: development of treatment plan with patient or surrogate, discussions with consultants, evaluation of patient's response to treatment, examination of patient, obtaining history from patient or surrogate, ordering and performing treatments and interventions, ordering and review of laboratory studies, ordering and review of radiographic studies, pulse oximetry, re-evaluation of patient's condition and review of old charts.   (including critical care time) Labs Review All labs drawn in ED reviewed.  Imaging Review Dg Chest Port 1 View  09/01/2013   *RADIOLOGY REPORT*  Clinical Data: Shortness of breath, asthma.  PORTABLE CHEST - 1 VIEW  Comparison: October 31, 2011.  Findings: Cardiomediastinal silhouette appears normal.  No acute pulmonary disease is noted.  Bony thorax is intact.  IMPRESSION: No acute cardiopulmonary abnormality seen.   Original Report Authenticated By: Lupita Raider.,  M.D.  All radiology studies independently viewed by me.     EKG - sinus tachycardia, rate 126, normal axis, normal intervals, no ST/T changes, rate increased compared to prior.    MDM  1. Status Asthmaticus  37 year old female with a history of asthma presenting with respiratory distress. Exam shows an anxious female with mildly decreased air movement bilateral wheezing. Will give her a continuous neb and reassess.  During ED course, required multiple continuous nebulizer treatments of albuterol and atrovent, IV solumedrol, IV magnesium, all with incomplete resolution of her symptoms.  Ultimately admitted to stepdown unit.   Candyce Churn, MD 09/02/13 [phone]

## 2013-09-01 NOTE — H&P (Signed)
[facility] History and Physical  [name] ZOX:[id number] DOB: [birth year] DOA: 09/01/2013  Referring physician: Dr. Loretha Stapler PCP: No primary provider on file.  Specialists: none  Chief Complaint: SOB  HPI: [name] is a 37 y.o. female has a past medical history significant for asthma, tobacco abuse, migraines, hypertension, presents to the emergency room with a chief complaint of shortness of breath. She has been having worsening breathing problems in the last 3 months since she has moved back to [place]. She states that her asthma has been difficult to control, especially she she moved into a new rental house. She denies any fever or chills, however she endorses a cough with sputum production in the last few days. She has no abdominal pain, nausea or vomiting. She endorses mild lightheadedness when she walks. She has chest tightness with breathing. She is an active smoker and is currently trying to quit. In the emergency room, patient has had multiple breathing treatments, IV steroids, and she is still wheezing significantly. [facility] has been asked to admit for further monitoring.  Review of Systems: As per history of present illness, otherwise negative  Past Medical History  Diagnosis Date  . Bipolar 1 disorder, depressed, mild   . Migraines   . GERD (gastroesophageal reflux disease)   . Bilateral ovarian cysts   . Asthma   . Pregnancy induced hypertension   . HTN (hypertension)    Past Surgical History  Procedure Laterality Date  . Cesarean section    . Induced abortion    . Tubal ligation     Social History:  reports that she has been smoking Cigarettes.  She has been smoking about 0.50 packs per day. She has never used smokeless tobacco. She reports that  drinks alcohol. She reports that she does not use illicit drugs.  Allergies  Allergen Reactions  . Prednisone Swelling and Rash    Leg swells up; pt reports I.V use with no problems    Family history noncontributory  Prior to Admission medications   Medication Sig Start Date End Date Taking? Authorizing Provider  albuterol (PROVENTIL) (2.5 MG/3ML) 0.083% nebulizer solution Take 2.5 mg by nebulization every 6 (six) hours as needed for wheezing or shortness of breath.   Yes Historical Provider, MD  albuterol (PROVENTIL,VENTOLIN) 90 MCG/ACT inhaler Inhale 2 puffs into the lungs every 6 (six) hours as needed for wheezing or shortness of breath.    Yes Historical Provider, MD  ibuprofen (ADVIL,MOTRIN) 200 MG tablet Take 200-600 mg by mouth every 8 (eight) hours as needed for pain.   Yes Historical Provider, MD   Physical Exam: Filed Vitals:   09/01/13 1900 09/01/13 1930 09/01/13 2000 09/01/13 2021  BP: 142/84 119/82 133/82   Pulse: 108 122 116   Temp:      TempSrc:      Resp: 17 18 17    SpO2: 100% 93% 90% 90%     General:  In mild distress, talking in full sentences.  Eyes: PERRL, EOMI, no scleral icterus  ENT: moist oropharynx  Neck: supple, no JVD  Cardiovascular: regular rate without MRG; 2+ peripheral pulses; tachycardia  Respiratory: Significant wheezes throughout bilateral lung fields.   Abdomen: soft, non tender to palpation, positive bowel sounds, no guarding, no rebound  Skin: no rashes  Musculoskeletal: no peripheral edema  Psychiatric: normal mood and affect  Neurologic: Nonfocal  Labs on Admission:  Basic Metabolic Panel:  Recent Labs Lab 09/01/13 1809  NA 138  K 3.1*  CL 104  CO2 22  GLUCOSE 92  BUN 6  CREATININE 0.73  CALCIUM 9.1   CBC:  Recent Labs Lab 09/01/13 1809  WBC 8.5  NEUTROABS 5.5  HGB 13.2  HCT 38.1  MCV 90.3  PLT 316    Radiological Exams on Admission: Dg Chest Port 1 View  09/01/2013   *RADIOLOGY REPORT*  Clinical Data: Shortness of breath, asthma.  PORTABLE CHEST - 1 VIEW  Comparison: October 31, 2011.  Findings: Cardiomediastinal silhouette appears normal.  No acute pulmonary disease is noted.   Bony thorax is intact.  IMPRESSION: No acute cardiopulmonary abnormality seen.   Original Report Authenticated By: Lupita Raider.,  M.D.    EKG: Independently reviewed.  Assessment/Plan Active Problems:   Asthma exacerbation   Tobacco abuse  Asthma exacerbation - may have been triggered by recent infection given cough with sputum production. Will go ahead and give her levofloxacin. IV steroids. Continue breathing treatments. She has significant wheezing on exam there is good but overall decreased air movement throughout. Will admit to step down unit for closer monitoring. Tobacco abuse - counseled for cessation DVT Prophylaxis - Lovenox  Code Status: Full  Family Communication: none  Disposition Plan: inpatient  Time spent: 1  Costin M. Elvera Lennox, MD [facility] Pager [phone]  If 7PM-7AM, please contact night-coverage [email or web address] Password [email or web address] 09/01/2013, 11:01 PM

## 2013-09-02 DIAGNOSIS — I10 Essential (primary) hypertension: Secondary | ICD-10-CM

## 2013-09-02 LAB — MRSA PCR SCREENING: MRSA by PCR: NEGATIVE

## 2013-09-02 LAB — BASIC METABOLIC PANEL
CO2: 15 mEq/L — ABNORMAL LOW (ref 19–32)
Calcium: 9.1 mg/dL (ref 8.4–10.5)
Potassium: 3.6 mEq/L (ref 3.5–5.1)
Sodium: 137 mEq/L (ref 135–145)

## 2013-09-02 MED ORDER — HYDROCODONE-ACETAMINOPHEN 5-325 MG PO TABS
1.0000 | ORAL_TABLET | Freq: Four times a day (QID) | ORAL | Status: DC | PRN
  Administered 2013-09-02: 1 via ORAL
  Filled 2013-09-02: qty 1

## 2013-09-02 MED ORDER — HYDROCHLOROTHIAZIDE 25 MG PO TABS
25.0000 mg | ORAL_TABLET | Freq: Every day | ORAL | Status: DC
  Administered 2013-09-02: 25 mg via ORAL
  Filled 2013-09-02 (×3): qty 1

## 2013-09-02 MED ORDER — LEVALBUTEROL HCL 0.63 MG/3ML IN NEBU
0.6300 mg | INHALATION_SOLUTION | RESPIRATORY_TRACT | Status: DC | PRN
  Administered 2013-09-02 – 2013-09-03 (×2): 0.63 mg via RESPIRATORY_TRACT
  Filled 2013-09-02 (×2): qty 3

## 2013-09-02 MED ORDER — FLUTICASONE PROPIONATE HFA 44 MCG/ACT IN AERO
1.0000 | INHALATION_SPRAY | Freq: Two times a day (BID) | RESPIRATORY_TRACT | Status: DC
  Administered 2013-09-02 – 2013-09-03 (×2): 1 via RESPIRATORY_TRACT
  Filled 2013-09-02 (×2): qty 10.6

## 2013-09-02 MED ORDER — LEVALBUTEROL HCL 1.25 MG/0.5ML IN NEBU
1.2500 mg | INHALATION_SOLUTION | Freq: Four times a day (QID) | RESPIRATORY_TRACT | Status: DC
  Administered 2013-09-02 – 2013-09-03 (×4): 1.25 mg via RESPIRATORY_TRACT
  Filled 2013-09-02 (×9): qty 0.5

## 2013-09-02 MED ORDER — METHYLPREDNISOLONE SODIUM SUCC 125 MG IJ SOLR
80.0000 mg | Freq: Four times a day (QID) | INTRAMUSCULAR | Status: DC
  Administered 2013-09-02 – 2013-09-03 (×4): 80 mg via INTRAVENOUS
  Filled 2013-09-02 (×8): qty 1.28

## 2013-09-02 NOTE — Progress Notes (Signed)
Patient received from [facility] via wheelchair, alert and oriented, with IV fluids infusing.  No complaints of pain, explained ordering of meals, oriented to room, suggested she call for assistance to bathroom.  Given beverage to drink.

## 2013-09-02 NOTE — Progress Notes (Signed)
[facility] PROGRESS NOTE  [name] UEA:[id number] DOB: [birth year] DOA: 09/01/2013 PCP: No primary provider on file.  Assessment/Plan: Asthma with Acute Exacerbation -Is improved today. -Will titrate steroids. -She states she 3-4 exacerbations per month, so she is not well controlled on just a rescue inhaler. -Will add an inhaled steroid to her medication regimen.  Tobacco Abuse -Counseled on cessation.  HTN -States she has a history of HTN but has not been taking meds in many months. -Since no other compelling indication, will start on HCTZ 25 mg daily.  Code Status: Full Code Family Communication: Patient only Disposition Plan: Home when stable. Transfer to floor today.   Consultants:  None   Antibiotics:  Levaquin   Subjective: SOB, while still present, has improved.  Objective: Filed Vitals:   09/02/13 0100 09/02/13 0320 09/02/13 0405 09/02/13 0800  BP: 165/94 163/93    Pulse: 120 119    Temp: 98.4 F (36.9 C)  98.1 F (36.7 C) 98.5 F (36.9 C)  TempSrc: Oral  Oral Oral  Resp: 21 28    Height: 5' (1.524 m)     Weight: 66.9 kg (147 lb 7.8 oz)     SpO2: 95% 93%      Intake/Output Summary (Last 24 hours) at 09/02/13 0939 Last data filed at 09/02/13 0800  Gross per 24 hour  Intake    695 ml  Output   1325 ml  Net   -630 ml   Filed Weights   09/02/13 0100  Weight: 66.9 kg (147 lb 7.8 oz)    Exam:   General:  AA Ox3, NAD  Cardiovascular: Tachy, regular  Respiratory: Coarse breath sounds, no wheezing.  Abdomen: S/NT/ND/+BS  Extremities: no C.C.E   Neurologic:  Grossly intact and non-focal.  Data Reviewed: Basic Metabolic Panel:  Recent Labs Lab 09/01/13 1809 09/02/13 0325  NA 138 137  K 3.1* 3.6  CL 104 101  CO2 22 15*  GLUCOSE 92 176*  BUN 6 5*  CREATININE 0.73 0.73  CALCIUM 9.1 9.1   Liver Function Tests: No results found for this basename: AST, ALT, ALKPHOS, BILITOT, PROT, ALBUMIN,  in the last 168 hours No  results found for this basename: LIPASE, AMYLASE,  in the last 168 hours No results found for this basename: AMMONIA,  in the last 168 hours CBC:  Recent Labs Lab 09/01/13 1809  WBC 8.5  NEUTROABS 5.5  HGB 13.2  HCT 38.1  MCV 90.3  PLT 316   Cardiac Enzymes: No results found for this basename: CKTOTAL, CKMB, CKMBINDEX, TROPONINI,  in the last 168 hours BNP (last 3 results) No results found for this basename: PROBNP,  in the last 8760 hours CBG: No results found for this basename: GLUCAP,  in the last 168 hours  Recent Results (from the past 240 hour(s))  MRSA PCR SCREENING     Status: None   Collection Time    09/02/13  1:05 AM      Result Value Range Status   MRSA by PCR NEGATIVE  NEGATIVE Final   Comment:            The GeneXpert MRSA Assay (FDA     approved for NASAL specimens     only), is one component of a     comprehensive MRSA colonization     surveillance program. It is not     intended to diagnose MRSA     infection nor to guide or     monitor treatment for  MRSA infections.     Studies: Dg Chest Port 1 View  09/01/2013   *RADIOLOGY REPORT*  Clinical Data: Shortness of breath, asthma.  PORTABLE CHEST - 1 VIEW  Comparison: October 31, 2011.  Findings: Cardiomediastinal silhouette appears normal.  No acute pulmonary disease is noted.  Bony thorax is intact.  IMPRESSION: No acute cardiopulmonary abnormality seen.   Original Report Authenticated By: Lupita Raider.,  M.D.    Scheduled Meds: . enoxaparin (LOVENOX) injection  40 mg Subcutaneous QHS  . fluticasone  1 puff Inhalation BID  . hydrochlorothiazide  25 mg Oral Daily  . levalbuterol  1.25 mg Nebulization Q6H  . levofloxacin (LEVAQUIN) IV  500 mg Intravenous Q24H  . methylPREDNISolone (SOLU-MEDROL) injection  80 mg Intravenous Q6H  . sodium chloride  3 mL Intravenous Q12H   Continuous Infusions: . sodium chloride 75 mL/hr at 09/02/13 0004    Principal Problem:   Asthma exacerbation Active  Problems:   Tobacco abuse   HTN (hypertension)    Time spent: 35 minutes    HERNANDEZ ACOSTA,ESTELA  [facility] Pager [phone]  If 7PM-7AM, please contact night-coverage at [email or web address], password [facility] 09/02/2013, 9:39 AM  LOS: 1 day

## 2013-09-02 NOTE — Progress Notes (Signed)
Nutrition Brief Note  Patient identified on the Malnutrition Screening Tool (MST) Report. Pt reports that her usual weight is 160 - 165 lb, and weighed this "several months ago." She states that her appetite at home was variable 2/2 stress. She is currently eating very well, consumed all of her breakfast this morning; pt is likely meeting all of her estimated needs at this time.  Body mass index is 28.8 kg/(m^2). Patient meets criteria for Overweight based on current BMI.   Current diet order is Regular, patient is consuming approximately 100% of meals at this time. Labs and medications reviewed.   No nutrition interventions warranted at this time. If nutrition issues arise, please consult RD.   Jarold Motto MS, RD, LDN Pager: [phone] After-hours pager: [phone]

## 2013-09-02 NOTE — Progress Notes (Signed)
[id number]/Allien Melberg, RN, BSN, CCM [phone] Chart Reviewed for discharge and hospital needs. Discharge needs at time of review:  None Review of patient progress due on [id number]. 

## 2013-09-02 NOTE — ED Notes (Signed)
Xopenex completed. Patient given warm chicken broth.

## 2013-09-03 LAB — CBC
Hemoglobin: 13.2 g/dL (ref 12.0–15.0)
MCV: 91 fL (ref 78.0–100.0)
Platelets: 339 10*3/uL (ref 150–400)
RBC: 4.23 MIL/uL (ref 3.87–5.11)
WBC: 21 10*3/uL — ABNORMAL HIGH (ref 4.0–10.5)

## 2013-09-03 LAB — BASIC METABOLIC PANEL
CO2: 21 mEq/L (ref 19–32)
Calcium: 9.9 mg/dL (ref 8.4–10.5)
GFR calc non Af Amer: >90 mL/min (ref >90)
Potassium: 3.7 mEq/L (ref 3.5–5.1)
Sodium: 136 mEq/L (ref 135–145)

## 2013-09-03 MED ORDER — ALBUTEROL SULFATE HFA 108 (90 BASE) MCG/ACT IN AERS: 2.0000 | INHALATION_SPRAY | Freq: Four times a day (QID) | RESPIRATORY_TRACT | Status: DC | PRN

## 2013-09-03 MED ORDER — FLUTICASONE PROPIONATE HFA 44 MCG/ACT IN AERO: 1.0000 | INHALATION_SPRAY | Freq: Two times a day (BID) | RESPIRATORY_TRACT | Status: DC

## 2013-09-03 MED ORDER — LEVOFLOXACIN 500 MG PO TABS
500.0000 mg | ORAL_TABLET | Freq: Every day | ORAL | Status: DC
  Filled 2013-09-03: qty 1

## 2013-09-03 MED ORDER — PREDNISONE (PAK) 10 MG PO TABS: 10.0000 mg | ORAL_TABLET | Freq: Every day | ORAL | Status: DC

## 2013-09-03 MED ORDER — HYDROCHLOROTHIAZIDE 25 MG PO TABS: 25.0000 mg | ORAL_TABLET | Freq: Every day | ORAL | Status: DC

## 2013-09-03 MED ORDER — LEVOFLOXACIN 500 MG PO TABS: 500.0000 mg | ORAL_TABLET | Freq: Every day | ORAL | Status: DC

## 2013-09-03 NOTE — Progress Notes (Signed)
PHARMACIST - PHYSICIAN COMMUNICATION DR:   Ardyth Harps CONCERNING: Antibiotic IV to Oral Route Change Policy  RECOMMENDATION: This patient is receiving Levaquin by the intravenous route.  Based on criteria approved by the Pharmacy and Therapeutics Committee, the antibiotic(s) is/are being converted to the equivalent oral dose form(s).   DESCRIPTION: These criteria include:  Patient being treated for a respiratory tract infection, urinary tract infection, cellulitis or clostridium difficile associated diarrhea if on metronidazole  The patient is not neutropenic and does not exhibit a GI malabsorption state  The patient is eating (either orally or via tube) and/or has been taking other orally administered medications for a least 24 hours  The patient is improving clinically and has a Tmax < 100.5  If you have questions about this conversion, please contact the Pharmacy Department  []   [phone] )  [facility] []   [phone] )  [facility]  []   [phone] )  [facility] [x]   [phone] )  [facility]    Hessie Knows, PharmD, BCPS Pager [phone] 09/03/2013 7:27 AM

## 2013-09-03 NOTE — Discharge Summary (Signed)
Physician Discharge Summary  [name]:[id number] DOB: 1976/07/28 DOA: 09/01/2013  PCP: No primary provider on file.  Admit date: 09/01/2013 Discharge date: 09/03/2013  Time spent: 45 minutes  Recommendations for Outpatient Follow-up:  -CM has given follow up information to obtain PCP.  -At time of follow up, will need BP check.  Discharge Diagnoses:  Principal Problem:   Asthma exacerbation Active Problems:   Tobacco abuse   HTN (hypertension)   Discharge Condition: Stable and improved  Filed Weights   09/02/13 0100  Weight: 66.9 kg (147 lb 7.8 oz)    History of present illness:  [name] is a 37 y.o. female has a past medical history significant for asthma, tobacco abuse, migraines, hypertension, presents to the emergency room with a chief complaint of shortness of breath. She has been having worsening breathing problems in the last 3 months since she has moved back to [place]. She states that her asthma has been difficult to control, especially she she moved into a new rental house. She denies any fever or chills, however she endorses a cough with sputum production in the last few days. She has no abdominal pain, nausea or vomiting. She endorses mild lightheadedness when she walks. She has chest tightness with breathing. She is an active smoker and is currently trying to quit. In the emergency room, patient has had multiple breathing treatments, IV steroids, and she is still wheezing significantly. [facility] has been asked to admit for further monitoring.   Hospital Course:   Asthma with Acute Exacerbation  -Is improved today.  -Will place on prednisone taper. -She states she 3-4 exacerbations per month, so she is not well controlled on just a rescue inhaler.  -Will add an inhaled steroid to her medication regimen.  -Stable for DC home today  Tobacco Abuse  -Counseled on cessation.   HTN  -States she has a history of HTN but has not been  taking meds in many months.  -Since no other compelling indication, will start on HCTZ 25 mg daily.  -BP improved.     Procedures:  None   Consultations:  none  Discharge Instructions  Discharge Orders   Future Orders Complete By Expires   Diet - low sodium heart healthy  As directed    Discontinue IV  As directed    Increase activity slowly  As directed        Medication List    STOP taking these medications       albuterol 90 MCG/ACT inhaler  Commonly known as:  PROVENTIL,VENTOLIN      TAKE these medications       albuterol 108 (90 BASE) MCG/ACT inhaler  Commonly known as:  PROVENTIL HFA;VENTOLIN HFA  Inhale 2 puffs into the lungs every 6 (six) hours as needed for wheezing.     albuterol (2.5 MG/3ML) 0.083% nebulizer solution  Commonly known as:  PROVENTIL  Take 2.5 mg by nebulization every 6 (six) hours as needed for wheezing or shortness of breath.     fluticasone 44 MCG/ACT inhaler  Commonly known as:  FLOVENT HFA  Inhale 1 puff into the lungs 2 (two) times daily.     hydrochlorothiazide 25 MG tablet  Commonly known as:  HYDRODIURIL  Take 1 tablet (25 mg total) by mouth daily.     ibuprofen 200 MG tablet  Commonly known as:  ADVIL,MOTRIN  Take 200-600 mg by mouth every 8 (eight) hours as needed for pain.     levofloxacin  500 MG tablet  Commonly known as:  LEVAQUIN  Take 1 tablet (500 mg total) by mouth at bedtime.     predniSONE 10 MG tablet  Commonly known as:  STERAPRED UNI-PAK  Take 1 tablet (10 mg total) by mouth daily. Take as directed       Allergies  Allergen Reactions  . Prednisone Swelling and Rash    Leg swells up; pt reports I.V use with no problems       Follow-up Information   Schedule an appointment as soon as possible for a visit in 2 weeks to follow up. (With your regular doctor)        The results of significant diagnostics from this hospitalization (including imaging, microbiology, ancillary and laboratory) are  listed below for reference.    Significant Diagnostic Studies: Dg Chest Port 1 View  09/01/2013   *RADIOLOGY REPORT*  Clinical Data: Shortness of breath, asthma.  PORTABLE CHEST - 1 VIEW  Comparison: October 31, 2011.  Findings: Cardiomediastinal silhouette appears normal.  No acute pulmonary disease is noted.  Bony thorax is intact.  IMPRESSION: No acute cardiopulmonary abnormality seen.   Original Report Authenticated By: Lupita Raider.,  M.D.    Microbiology: Recent Results (from the past 240 hour(s))  MRSA PCR SCREENING     Status: None   Collection Time    09/02/13  1:05 AM      Result Value Range Status   MRSA by PCR NEGATIVE  NEGATIVE Final   Comment:            The GeneXpert MRSA Assay (FDA     approved for NASAL specimens     only), is one component of a     comprehensive MRSA colonization     surveillance program. It is not     intended to diagnose MRSA     infection nor to guide or     monitor treatment for     MRSA infections.     Labs: Basic Metabolic Panel:  Recent Labs Lab 09/01/13 1809 09/02/13 0325 09/03/13 0430  NA 138 137 136  K 3.1* 3.6 3.7  CL 104 101 103  CO2 22 15* 21  GLUCOSE 92 176* 146*  BUN 6 5* 11  CREATININE 0.73 0.73 0.79  CALCIUM 9.1 9.1 9.9   Liver Function Tests: No results found for this basename: AST, ALT, ALKPHOS, BILITOT, PROT, ALBUMIN,  in the last 168 hours No results found for this basename: LIPASE, AMYLASE,  in the last 168 hours No results found for this basename: AMMONIA,  in the last 168 hours CBC:  Recent Labs Lab 09/01/13 1809 09/03/13 0430  WBC 8.5 21.0*  NEUTROABS 5.5  --   HGB 13.2 13.2  HCT 38.1 38.5  MCV 90.3 91.0  PLT 316 339   Cardiac Enzymes: No results found for this basename: CKTOTAL, CKMB, CKMBINDEX, TROPONINI,  in the last 168 hours BNP: BNP (last 3 results) No results found for this basename: PROBNP,  in the last 8760 hours CBG: No results found for this basename: GLUCAP,  in the last 168  hours     Signed:  Chaya Jan  [facility] Pager: [phone] 09/03/2013, 10:08 AM

## 2013-09-03 NOTE — Progress Notes (Signed)
Met with pt to discuss follow up care. She does not have health insurance at this time. I gave her information about the [facility] and advised her to make an appt ASAP to establish care. Pt agreed and was appreciative of the information.  Algernon Huxley RN BSN   [phone]

## 2013-11-02 ENCOUNTER — Encounter (HOSPITAL_COMMUNITY): Payer: Self-pay | Admitting: Emergency Medicine

## 2013-11-02 ENCOUNTER — Emergency Department (HOSPITAL_COMMUNITY)
Admission: EM | Admit: 2013-11-02 | Discharge: 2013-11-02 | Disposition: A | Discharge status: Home / Self Care | Payer: Self-pay | Attending: Emergency Medicine | Admitting: Emergency Medicine

## 2013-11-02 ENCOUNTER — Emergency Department (HOSPITAL_COMMUNITY): Payer: Self-pay

## 2013-11-02 DIAGNOSIS — Z8742 Personal history of other diseases of the female genital tract: Secondary | ICD-10-CM | POA: Insufficient documentation

## 2013-11-02 DIAGNOSIS — I10 Essential (primary) hypertension: Secondary | ICD-10-CM | POA: Insufficient documentation

## 2013-11-02 DIAGNOSIS — R5381 Other malaise: Secondary | ICD-10-CM | POA: Insufficient documentation

## 2013-11-02 DIAGNOSIS — Z79899 Other long term (current) drug therapy: Secondary | ICD-10-CM | POA: Insufficient documentation

## 2013-11-02 DIAGNOSIS — R079 Chest pain, unspecified: Secondary | ICD-10-CM | POA: Insufficient documentation

## 2013-11-02 DIAGNOSIS — R000 Tachycardia, unspecified: Secondary | ICD-10-CM | POA: Insufficient documentation

## 2013-11-02 DIAGNOSIS — IMO0002 Reserved for concepts with insufficient information to code with codable children: Secondary | ICD-10-CM | POA: Insufficient documentation

## 2013-11-02 DIAGNOSIS — Z8659 Personal history of other mental and behavioral disorders: Secondary | ICD-10-CM | POA: Insufficient documentation

## 2013-11-02 DIAGNOSIS — J45901 Unspecified asthma with (acute) exacerbation: Secondary | ICD-10-CM | POA: Insufficient documentation

## 2013-11-02 DIAGNOSIS — R6883 Chills (without fever): Secondary | ICD-10-CM | POA: Insufficient documentation

## 2013-11-02 DIAGNOSIS — Z8679 Personal history of other diseases of the circulatory system: Secondary | ICD-10-CM | POA: Insufficient documentation

## 2013-11-02 DIAGNOSIS — F17200 Nicotine dependence, unspecified, uncomplicated: Secondary | ICD-10-CM | POA: Insufficient documentation

## 2013-11-02 DIAGNOSIS — IMO0001 Reserved for inherently not codable concepts without codable children: Secondary | ICD-10-CM | POA: Insufficient documentation

## 2013-11-02 DIAGNOSIS — Z8719 Personal history of other diseases of the digestive system: Secondary | ICD-10-CM | POA: Insufficient documentation

## 2013-11-02 DIAGNOSIS — Z3202 Encounter for pregnancy test, result negative: Secondary | ICD-10-CM | POA: Insufficient documentation

## 2013-11-02 LAB — DIFFERENTIAL
Basophils Relative: 0 % (ref 0–1)
Lymphocytes Relative: 13 % (ref 12–46)
Lymphs Abs: 2 10*3/uL (ref 0.7–4.0)
Monocytes Absolute: 0.7 10*3/uL (ref 0.1–1.0)
Monocytes Relative: 5 % (ref 3–12)
Neutro Abs: 12.7 10*3/uL — ABNORMAL HIGH (ref 1.7–7.7)
Neutrophils Relative %: 81 % — ABNORMAL HIGH (ref 43–77)

## 2013-11-02 LAB — BASIC METABOLIC PANEL
CO2: 23 mEq/L (ref 19–32)
Calcium: 9.1 mg/dL (ref 8.4–10.5)
Chloride: 101 mEq/L (ref 96–112)
Glucose, Bld: 115 mg/dL — ABNORMAL HIGH (ref 70–99)
Sodium: 135 mEq/L (ref 135–145)

## 2013-11-02 LAB — CBC
Hemoglobin: 12.3 g/dL (ref 12.0–15.0)
MCH: 31.1 pg (ref 26.0–34.0)
Platelets: 322 10*3/uL (ref 150–400)
RBC: 3.96 MIL/uL (ref 3.87–5.11)
WBC: 15.7 10*3/uL — ABNORMAL HIGH (ref 4.0–10.5)

## 2013-11-02 LAB — URINALYSIS, ROUTINE W REFLEX MICROSCOPIC
Bilirubin Urine: NEGATIVE
Glucose, UA: NEGATIVE mg/dL
Hgb urine dipstick: NEGATIVE
Specific Gravity, Urine: 1.009 (ref 1.005–1.030)
Urobilinogen, UA: 1 mg/dL (ref 0.0–1.0)
pH: 7.5 (ref 5.0–8.0)

## 2013-11-02 LAB — POCT PREGNANCY, URINE: Preg Test, Ur: NEGATIVE

## 2013-11-02 MED ORDER — ALBUTEROL SULFATE (5 MG/ML) 0.5% IN NEBU
5.0000 mg | INHALATION_SOLUTION | Freq: Once | RESPIRATORY_TRACT | Status: AC
  Administered 2013-11-02: 5 mg via RESPIRATORY_TRACT
  Filled 2013-11-02: qty 1

## 2013-11-02 MED ORDER — ACETAMINOPHEN 325 MG PO TABS
650.0000 mg | ORAL_TABLET | Freq: Once | ORAL | Status: AC
  Administered 2013-11-02: 650 mg via ORAL
  Filled 2013-11-02: qty 2

## 2013-11-02 MED ORDER — IPRATROPIUM BROMIDE 0.02 % IN SOLN
0.5000 mg | Freq: Once | RESPIRATORY_TRACT | Status: AC
  Administered 2013-11-02: 0.5 mg via RESPIRATORY_TRACT
  Filled 2013-11-02: qty 2.5

## 2013-11-02 MED ORDER — KETOROLAC TROMETHAMINE 15 MG/ML IJ SOLN
15.0000 mg | Freq: Once | INTRAMUSCULAR | Status: AC
  Administered 2013-11-02: 15 mg via INTRAVENOUS
  Filled 2013-11-02: qty 1

## 2013-11-02 MED ORDER — SODIUM CHLORIDE 0.9 % IV BOLUS (SEPSIS)
500.0000 mL | Freq: Once | INTRAVENOUS | Status: AC
  Administered 2013-11-02: 500 mL via INTRAVENOUS

## 2013-11-02 NOTE — ED Notes (Signed)
Pt returned from radiology.

## 2013-11-02 NOTE — ED Notes (Signed)
Patient transported to X-ray 

## 2013-11-02 NOTE — ED Provider Notes (Signed)
CSN: [id number]     Arrival date & time 11/02/13  1750 History   First MD Initiated Contact with Patient 11/02/13 1758     Chief Complaint  Patient presents with  . Shortness of Breath   (Consider location/radiation/quality/duration/timing/severity/associated sxs/prior Treatment) HPI Comments: Patient with history of asthma, bipolar disorder -- presents with complaint of generalized fatigue and malaise and worsening shortness of breath for the past one week. Symptoms became much worse 2 days ago. Patient notes generalized chest pain when she takes a deep breath in. She has had chills and sweats but has not taken her temperature. Temperature is 100.66F in emergency department. She notes wheezing at night. She states that this does not feel like her typical asthma. Last admission for asthma flare was 09/01/13. No h/o PE/DVT, recent travel or immobilization, estrogen use. No dysuria, skin rash, URI sx such as sore throat, nasal congestion. She did not get a flu shot this year.    Patient is a 37 y.o. female presenting with shortness of breath. The history is provided by the patient and medical records.  Shortness of Breath Associated symptoms: chest pain, cough and wheezing   Associated symptoms: no abdominal pain, no ear pain, no fever, no headaches, no rash, no sore throat and no vomiting     Past Medical History  Diagnosis Date  . Bipolar 1 disorder, depressed, mild   . Migraines   . GERD (gastroesophageal reflux disease)   . Bilateral ovarian cysts   . Asthma   . Pregnancy induced hypertension   . HTN (hypertension)    Past Surgical History  Procedure Laterality Date  . Cesarean section    . Induced abortion    . Tubal ligation     History reviewed. No pertinent family history. History  Substance Use Topics  . Smoking status: Current Some Day Smoker -- 0.50 packs/day    Types: Cigarettes  . Smokeless tobacco: Never Used  . Alcohol Use: Yes     Comment: every other week   OB  History   Grav Para Term Preterm Abortions TAB SAB Ect Mult Living   3 2 1 1 1 1    2      Review of Systems  Constitutional: Positive for fatigue. Negative for fever and chills.  HENT: Negative for congestion, ear pain, rhinorrhea, sinus pressure and sore throat.   Eyes: Negative for redness.  Respiratory: Positive for cough, shortness of breath and wheezing.   Cardiovascular: Positive for chest pain. Negative for leg swelling.  Gastrointestinal: Negative for nausea, vomiting, abdominal pain and diarrhea.  Genitourinary: Negative for dysuria.  Musculoskeletal: Positive for myalgias. Negative for neck stiffness.  Skin: Negative for rash.  Neurological: Negative for headaches.  Hematological: Negative for adenopathy.    Allergies  Prednisone  Home Medications   Current Outpatient Rx  Name  Route  Sig  Dispense  Refill  . albuterol (PROVENTIL HFA;VENTOLIN HFA) 108 (90 BASE) MCG/ACT inhaler   Inhalation   Inhale 2 puffs into the lungs every 6 (six) hours as needed for wheezing or shortness of breath.         . fluticasone (FLOVENT HFA) 44 MCG/ACT inhaler   Inhalation   Inhale 1 puff into the lungs 2 (two) times daily.         . hydrochlorothiazide (HYDRODIURIL) 25 MG tablet   Oral   Take 25 mg by mouth daily.         . naproxen sodium (ANAPROX) 220 MG tablet  Oral   Take 440 mg by mouth 2 (two) times daily as needed (for pain).          BP 133/74  Pulse 130  Temp(Src) 100.9 F (38.3 C) (Oral)  Resp 22  SpO2 94% Physical Exam  Nursing note and vitals reviewed. Constitutional: She appears well-developed and well-nourished.  HENT:  Head: Normocephalic and atraumatic.  Right Ear: Tympanic membrane, external ear and ear canal normal.  Left Ear: Tympanic membrane, external ear and ear canal normal.  Nose: Nose normal. No mucosal edema or rhinorrhea.  Mouth/Throat: Uvula is midline, oropharynx is clear and moist and mucous membranes are normal. Mucous membranes  are not dry. No oral lesions. No trismus in the jaw. No uvula swelling. No oropharyngeal exudate, posterior oropharyngeal edema, posterior oropharyngeal erythema or tonsillar abscesses.  Eyes: Conjunctivae are normal. Right eye exhibits no discharge. Left eye exhibits no discharge.  Neck: Normal range of motion. Neck supple.  Cardiovascular: Regular rhythm and normal heart sounds.  Tachycardia present.   Pulmonary/Chest: Effort normal. No respiratory distress. She has wheezes (mild expiratory, upper bilateral). She has rhonchi. She has no rales. She exhibits no tenderness.  Abdominal: Soft. There is no tenderness.  Musculoskeletal: She exhibits no edema and no tenderness.  Lymphadenopathy:    She has no cervical adenopathy.  Neurological: She is alert.  Skin: Skin is warm and dry.  Psychiatric: She has a normal mood and affect.    ED Course  Procedures (including critical care time) Labs Review Labs Reviewed  CBC - Abnormal; Notable for the following:    WBC 15.7 (*)    All other components within normal limits  BASIC METABOLIC PANEL - Abnormal; Notable for the following:    Glucose, Bld 115 (*)    All other components within normal limits  DIFFERENTIAL - Abnormal; Notable for the following:    Neutrophils Relative % 81 (*)    Neutro Abs 12.7 (*)    All other components within normal limits  URINALYSIS, ROUTINE W REFLEX MICROSCOPIC  POCT PREGNANCY, URINE   Imaging Review Dg Chest 2 View  11/02/2013   CLINICAL DATA:  Fever. Shortness of breath. Cough and congestion. Hypertension.  EXAM: CHEST  2 VIEW  COMPARISON:  09/01/2013  FINDINGS: Midline trachea. Normal heart size and mediastinal contours. No pleural effusion or pneumothorax. Clear lungs.  IMPRESSION: No acute cardiopulmonary disease.   Electronically Signed   By: Jeronimo Greaves M.D.   On: 11/02/2013 19:05    EKG Interpretation   None      Patient seen and examined. Work-up initiated.    Vital signs reviewed and are as  follows: Filed Vitals:   11/02/13 1854  BP:   Pulse:   Temp: 102.6 F (39.2 C)  Resp:   BP 141/86  Pulse 87  Temp(Src) 102.6 F (39.2 C) (Rectal)  Resp 22  SpO2 97%  LMP 08/28/2013  7:33 PM Pt has 102.59F rectal temp. Toradol and breathing treatment added. D/w Dr. Anitra Lauth. Will reassess. HR 115.  8:17 PM Handoff to Harris PA-C at shift change.   Plan: Re-eval after fluids, toradol, albuterol. If she is improved and vital improved and O2 sat is normal, can discharge. If continues to look poorly with vital sign abnormality, consider admission.    MDM   1. Influenza-like illness    Pending completion of treatment prior to disposition decision.     Renne Crigler, PA-C 11/02/13 2021

## 2013-11-02 NOTE — ED Provider Notes (Signed)
8:23 PM Filed Vitals:   11/02/13 1852 11/02/13 1853 11/02/13 1854 11/02/13 1946  BP:  141/86    Pulse: 87     Temp:   102.6 F (39.2 C)   TempSrc:   Rectal   Resp:      SpO2: 92% 97%  97%    Assumed care of the patient from PA Geiple. This is a 37 year old female with a past medical history of asthma.  It was recently hospitalized for asthma in September of 2014.  The patient has had worsening malaise and fatigue over the past week.  The past today she cannot to be worse with fever and shortness of breath.  Patient presents to the ED today with influenza like symptoms, fever of 10 72F.  The patient has received albuterol treatment, she skating fluid and medications have included ketorolac and Tylenol.  Patient is currently receiving fluids will reevaluate the patient in approximately one hour for improvement in her breathing and vital signs.   9:56 PM Filed Vitals:   11/02/13 2100 11/02/13 2115 11/02/13 2130 11/02/13 2143  BP: 122/78 126/72 143/105 124/75  Pulse: 105 102 103 102  Temp:    99.4 F (37.4 C)  TempSrc:    Rectal  Resp:    20  SpO2: 96% 95% 98% 96%   Patient with elevated WBC, left shift. She states she is feeling much better.  She would like to go home at this point. CV: RRR, No M/R/G, Peripheral pulses intact. No peripheral edema. Lungs: Bronchitic cough. No wheezes, Abd: Soft, Non tender, non distended  Patient with symptoms consistent with ILI.  Vitals are stable,  Fever decreased and mild tachycardia.  No signs of dehydration, tolerating PO's.  Lungs are clear. Patient will be discharged with instructions to orally hydrate, rest, and use over-the-counter medications such as anti-inflammatories ibuprofen and Aleve for muscle aches and Tylenol for fever.    Arthor Captain, PA-C 11/02/13 2201

## 2013-11-02 NOTE — ED Notes (Signed)
Pt c/o feeling SOB, fatigued, body aches, fevers x 1 week. States she recently stopped her BP/diuretic pill and has also noticed a 7 lb weight gain this week. Pt with labored breathing on exertion in triage

## 2013-11-03 NOTE — ED Provider Notes (Signed)
Medical screening examination/treatment/procedure(s) were performed by non-physician practitioner and as supervising physician I was immediately available for consultation/collaboration.  EKG Interpretation   None         Gwyneth Sprout, MD 11/03/13 1102

## 2013-11-04 NOTE — ED Provider Notes (Signed)
Medical screening examination/treatment/procedure(s) were performed by non-physician practitioner and as supervising physician I was immediately available for consultation/collaboration.  EKG Interpretation   None         William Darenda Fike, MD 11/04/13 1202 

## 2014-01-12 ENCOUNTER — Inpatient Hospital Stay (HOSPITAL_COMMUNITY): Payer: Self-pay

## 2014-01-12 ENCOUNTER — Encounter (HOSPITAL_COMMUNITY): Payer: Self-pay

## 2014-01-12 ENCOUNTER — Inpatient Hospital Stay (HOSPITAL_COMMUNITY)
Admission: AD | Admit: 2014-01-12 | Discharge: 2014-01-12 | Disposition: A | Discharge status: Home / Self Care | Payer: Self-pay | Attending: Obstetrics and Gynecology | Admitting: Obstetrics and Gynecology

## 2014-01-12 DIAGNOSIS — G8929 Other chronic pain: Secondary | ICD-10-CM

## 2014-01-12 DIAGNOSIS — Z8742 Personal history of other diseases of the female genital tract: Secondary | ICD-10-CM

## 2014-01-12 DIAGNOSIS — N938 Other specified abnormal uterine and vaginal bleeding: Secondary | ICD-10-CM | POA: Insufficient documentation

## 2014-01-12 DIAGNOSIS — D219 Benign neoplasm of connective and other soft tissue, unspecified: Secondary | ICD-10-CM

## 2014-01-12 DIAGNOSIS — R109 Unspecified abdominal pain: Secondary | ICD-10-CM | POA: Insufficient documentation

## 2014-01-12 DIAGNOSIS — F17200 Nicotine dependence, unspecified, uncomplicated: Secondary | ICD-10-CM | POA: Insufficient documentation

## 2014-01-12 DIAGNOSIS — N949 Unspecified condition associated with female genital organs and menstrual cycle: Secondary | ICD-10-CM

## 2014-01-12 DIAGNOSIS — R102 Pelvic and perineal pain: Secondary | ICD-10-CM

## 2014-01-12 DIAGNOSIS — D259 Leiomyoma of uterus, unspecified: Secondary | ICD-10-CM | POA: Insufficient documentation

## 2014-01-12 HISTORY — DX: Anemia, unspecified: D64.9

## 2014-01-12 LAB — WET PREP, GENITAL
Trich, Wet Prep: NONE SEEN
Yeast Wet Prep HPF POC: NONE SEEN

## 2014-01-12 LAB — CBC
HCT: 34.7 % — ABNORMAL LOW (ref 36.0–46.0)
Hemoglobin: 11.8 g/dL — ABNORMAL LOW (ref 12.0–15.0)
MCH: 30.7 pg (ref 26.0–34.0)
MCHC: 34 g/dL (ref 30.0–36.0)
MCV: 90.4 fL (ref 78.0–100.0)
PLATELETS: 305 10*3/uL (ref 150–400)
RBC: 3.84 MIL/uL — AB (ref 3.87–5.11)
RDW: 13 % (ref 11.5–15.5)
WBC: 6 10*3/uL (ref 4.0–10.5)

## 2014-01-12 LAB — COMPREHENSIVE METABOLIC PANEL
ALT: 9 U/L (ref 0–35)
AST: 13 U/L (ref 0–37)
Albumin: 3.4 g/dL — ABNORMAL LOW (ref 3.5–5.2)
Alkaline Phosphatase: 45 U/L (ref 39–117)
BUN: 11 mg/dL (ref 6–23)
CALCIUM: 8.8 mg/dL (ref 8.4–10.5)
CO2: 25 meq/L (ref 19–32)
CREATININE: 0.88 mg/dL (ref 0.50–1.10)
Chloride: 105 mEq/L (ref 96–112)
GFR, EST NON AFRICAN AMERICAN: 84 mL/min — AB (ref >90)
Glucose, Bld: 87 mg/dL (ref 70–99)
Potassium: 4.1 mEq/L (ref 3.7–5.3)
Sodium: 140 mEq/L (ref 137–147)
Total Bilirubin: 0.4 mg/dL (ref 0.3–1.2)
Total Protein: 5.9 g/dL — ABNORMAL LOW (ref 6.0–8.3)

## 2014-01-12 LAB — URINE MICROSCOPIC-ADD ON

## 2014-01-12 LAB — URINALYSIS, ROUTINE W REFLEX MICROSCOPIC
BILIRUBIN URINE: NEGATIVE
Glucose, UA: NEGATIVE mg/dL
Ketones, ur: NEGATIVE mg/dL
Leukocytes, UA: NEGATIVE
Nitrite: NEGATIVE
PROTEIN: NEGATIVE mg/dL
Specific Gravity, Urine: 1.02 (ref 1.005–1.030)
UROBILINOGEN UA: 0.2 mg/dL (ref 0.0–1.0)
pH: 7 (ref 5.0–8.0)

## 2014-01-12 LAB — POCT PREGNANCY, URINE: Preg Test, Ur: NEGATIVE

## 2014-01-12 MED ORDER — HYDROMORPHONE HCL PF 1 MG/ML IJ SOLN
1.0000 mg | Freq: Once | INTRAMUSCULAR | Status: AC
  Administered 2014-01-12: 1 mg via INTRAMUSCULAR
  Filled 2014-01-12: qty 1

## 2014-01-12 MED ORDER — OXYCODONE-ACETAMINOPHEN 5-325 MG PO TABS
1.0000 | ORAL_TABLET | Freq: Once | ORAL | Status: AC
  Administered 2014-01-12: 1 via ORAL
  Filled 2014-01-12: qty 1

## 2014-01-12 MED ORDER — IBUPROFEN 600 MG PO TABS: 600.0000 mg | ORAL_TABLET | Freq: Four times a day (QID) | ORAL | Status: DC | PRN

## 2014-01-12 MED ORDER — OXYCODONE-ACETAMINOPHEN 5-325 MG PO TABS: 2.0000 | ORAL_TABLET | ORAL | Status: DC | PRN

## 2014-01-12 NOTE — MAU Provider Note (Signed)
History     CSN: [id number]  Arrival date and time: 01/12/14 1237   First Provider Initiated Contact with Patient 01/12/14 1356      Chief Complaint  Patient presents with  . Abdominal Pain   HPI  [name] is a 38 y.o. female who presents with severe right-mid abdominal pain. On 01/06/2014 she started her menstrual cycle; large amount of pain when she started her menstrual cycle; since she has started her menstrual cycle the pain has not gone away. She is not currently bleeding now. She has regular bowel movements and goes everyday. She has been in intense pain since January 19th- unable to get out of the bed. Pt has taken ibuprofen for pain  no relief now.  Pt has history of complex cysts, endometriosis, and fibroids. Pt has been seen in the ER for this multiple times in the past; pt has not established GYN care.   OB History   Grav Para Term Preterm Abortions TAB SAB Ect Mult Living   '3 2 1 1 1 1    2      ' Past Medical History  Diagnosis Date  . Bipolar 1 disorder, depressed, mild   . Migraines   . GERD (gastroesophageal reflux disease)   . Bilateral ovarian cysts   . Asthma   . Pregnancy induced hypertension   . HTN (hypertension)   . Anemia     Past Surgical History  Procedure Laterality Date  . Cesarean section    . Induced abortion    . Tubal ligation      History reviewed. No pertinent family history.  History  Substance Use Topics  . Smoking status: Current Every Day Smoker -- 0.50 packs/day    Types: Cigarettes  . Smokeless tobacco: Never Used  . Alcohol Use: Yes     Comment: every other week    Allergies:  Allergies  Allergen Reactions  . Prednisone Swelling and Rash    Leg swells up; pt reports I.V use with no problems    Prescriptions prior to admission  Medication Sig Dispense Refill  . hydrochlorothiazide (HYDRODIURIL) 25 MG tablet Take 25 mg by mouth daily.      Marland Kitchen ibuprofen (ADVIL,MOTRIN) 200 MG tablet Take 800-1,000 mg by  mouth every 6 (six) hours as needed for moderate pain.      Marland Kitchen albuterol (PROVENTIL HFA;VENTOLIN HFA) 108 (90 BASE) MCG/ACT inhaler Inhale 2 puffs into the lungs every 6 (six) hours as needed for wheezing or shortness of breath.       Results for orders placed during the hospital encounter of 01/12/14 (from the past 48 hour(s))  URINALYSIS, ROUTINE W REFLEX MICROSCOPIC     Status: Abnormal   Collection Time    01/12/14 12:55 PM      Result Value Range   Color, Urine YELLOW  YELLOW   APPearance HAZY (*) CLEAR   Specific Gravity, Urine 1.020  1.005 - 1.030   pH 7.0  5.0 - 8.0   Glucose, UA NEGATIVE  NEGATIVE mg/dL   Hgb urine dipstick LARGE (*) NEGATIVE   Bilirubin Urine NEGATIVE  NEGATIVE   Ketones, ur NEGATIVE  NEGATIVE mg/dL   Protein, ur NEGATIVE  NEGATIVE mg/dL   Urobilinogen, UA 0.2  0.0 - 1.0 mg/dL   Nitrite NEGATIVE  NEGATIVE   Leukocytes, UA NEGATIVE  NEGATIVE  URINE MICROSCOPIC-ADD ON     Status: Abnormal   Collection Time    01/12/14 12:55 PM  Result Value Range   Squamous Epithelial / LPF MANY (*) RARE   WBC, UA 3-6  <3 WBC/hpf   RBC / HPF 0-2  <3 RBC/hpf   Bacteria, UA MANY (*) RARE  POCT PREGNANCY, URINE     Status: None   Collection Time    01/12/14  1:01 PM      Result Value Range   Preg Test, Ur NEGATIVE  NEGATIVE   Comment:            THE SENSITIVITY OF THIS     METHODOLOGY IS >24 mIU/mL  CBC     Status: Abnormal   Collection Time    01/12/14  1:25 PM      Result Value Range   WBC 6.0  4.0 - 10.5 K/uL   RBC 3.84 (*) 3.87 - 5.11 MIL/uL   Hemoglobin 11.8 (*) 12.0 - 15.0 g/dL   HCT 34.7 (*) 36.0 - 46.0 %   MCV 90.4  78.0 - 100.0 fL   MCH 30.7  26.0 - 34.0 pg   MCHC 34.0  30.0 - 36.0 g/dL   RDW 13.0  11.5 - 15.5 %   Platelets 305  150 - 400 K/uL  COMPREHENSIVE METABOLIC PANEL     Status: Abnormal   Collection Time    01/12/14  1:25 PM      Result Value Range   Sodium 140  137 - 147 mEq/L   Potassium 4.1  3.7 - 5.3 mEq/L   Chloride 105  96 - 112  mEq/L   CO2 25  19 - 32 mEq/L   Glucose, Bld 87  70 - 99 mg/dL   BUN 11  6 - 23 mg/dL   Creatinine, Ser 0.88  0.50 - 1.10 mg/dL   Calcium 8.8  8.4 - 10.5 mg/dL   Total Protein 5.9 (*) 6.0 - 8.3 g/dL   Albumin 3.4 (*) 3.5 - 5.2 g/dL   AST 13  0 - 37 U/L   ALT 9  0 - 35 U/L   Alkaline Phosphatase 45  39 - 117 U/L   Total Bilirubin 0.4  0.3 - 1.2 mg/dL   GFR calc non Af Amer 84 (*) >90 mL/min   GFR calc Af Amer >90  >90 mL/min   Comment: (NOTE)     The eGFR has been calculated using the CKD EPI equation.     This calculation has not been validated in all clinical situations.     eGFR's persistently <90 mL/min signify possible Chronic Kidney     Disease.   US Pelvis Complete  01/12/2014   CLINICAL DATA:  Pain.  EXAM: TRANSABDOMINAL ULTRASOUND OF PELVIS  DOPPLER ULTRASOUND OF OVARIES  TECHNIQUE: Transabdominal ultrasound examination of the pelvis was performed including evaluation of the uterus, ovaries, adnexal regions, and pelvic cul-de-sac.  Color and duplex Doppler ultrasound was utilized to evaluate blood flow to the ovaries.  COMPARISON:  06/31/2014.  FINDINGS: Uterus  Measurements: 8.1 x 4.5 x 5.8 cm. Small fibroid in the posterior uterine fundus measuring 1.2 cm in maximum diameter. Small posterior fibroid noted along the right portion of the fundus measuring 1.3 cm in maximum diameter.  Endometrium  Thickness: 3.2 mm.  No focal abnormality visualized.  Right ovary  Measurements: 4 x 2.8 x 3 cm. Normal appearance/no adnexal mass.  Left ovary  Measurements: 3.6 x 2 x 2.3 cm Normal appearance/no adnexal mass.  Pulsed Doppler evaluation demonstrates normal low-resistance arterial and venous waveforms in both ovaries.  IMPRESSION:  1. Small uterine fibroids. 2. Otherwise normal exam.  No evidence of torsion.   Electronically Signed   By: Marcello Moores  Register   On: 01/12/2014 16:08    Review of Systems  Constitutional: Negative for fever and chills.  Gastrointestinal: Positive for nausea and  abdominal pain. Negative for vomiting, diarrhea and constipation.  Genitourinary: Negative for dysuria, urgency, frequency, hematuria and flank pain.       No vaginal discharge. No vaginal bleeding. No dysuria.   Musculoskeletal: Positive for back pain.   Physical Exam   Blood pressure 119/77, pulse 54, temperature 98.1 F (36.7 C), temperature source Oral, resp. rate 16, height '5\' 2"'  (1.575 m), weight 73.483 kg (162 lb), last menstrual period 01/06/2014, SpO2 100.00%.  Physical Exam  Constitutional: She is oriented to person, place, and time. She appears well-developed and well-nourished.  Non-toxic appearance. She appears distressed.  HENT:  Head: Normocephalic.  Eyes: Pupils are equal, round, and reactive to light.  Neck: Neck supple.  Respiratory: Effort normal.  GI: Soft. She exhibits no distension and no mass. There is tenderness in the right lower quadrant and suprapubic area. There is no rigidity, no rebound and no guarding.  Mid- right lower abdominal tenderness   Genitourinary:  Speculum exam: Vagina - Small amount of creamy, pink discharge, no odor Cervix - No contact bleeding Bimanual exam: Cervix closed, no CMT  Uterus with tenderness, slightly enlarged  Right Adnexal tenderness, no masses bilaterally,             + suprapubic tenderness  GC/Chlam, wet prep done Chaperone present for exam.   Neurological: She is alert and oriented to person, place, and time.  Skin: Skin is warm. She is not diaphoretic.  Psychiatric: Her behavior is normal.    MAU Course  Procedures None  MDM Pelvic US CBC- normal white count  CMET UA Urine culture- pending Dilaudid 1 mg 1 percocet PO in MAU  Wet prep GC/Chlamydia   Assessment and Plan   A:  Chronic pelvic pain Dysfunctional uterine bleeding Uterine fibroids    P:  Discharge home Referral to [facility] made RX: short course of PO percocet.        Ibuprofen   Return to  MAU as needed, if symptoms  worsen  Maite Burlison IRENE 01/12/2014, 1:57 PM

## 2014-01-12 NOTE — MAU Note (Signed)
Pt states has had issues with ovarian cysts, complex cysts. Has been told she has uterine fibroids, and uterus was adhering to abdominal wall. Here today for pain on right side of lower abdomen only. Pain began 01/06/2014, thought she was having a regular cycle. Passed large clots however bled very little. Next day bled more and had small clots. Today has been first day pt felt like getting up to get dressed. For the past 2-3 days has not had bleeding, however is still seeing blood slightly when wiping.

## 2014-01-12 NOTE — MAU Note (Signed)
Pt presents with complaints of pain in the right side of her abdomen that has gotten a lot worse and it started on the 19th of this month. She states she has had pain in her abdomen on and off with her menstrual cycle since the age of 64 but over the years it has gotten worse.

## 2014-01-13 LAB — GC/CHLAMYDIA PROBE AMP
CT PROBE, AMP APTIMA: NEGATIVE
GC PROBE AMP APTIMA: NEGATIVE

## 2014-01-14 ENCOUNTER — Encounter: Payer: Self-pay | Admitting: Obstetrics & Gynecology

## 2014-01-15 LAB — URINE CULTURE: Colony Count: 50000

## 2014-01-15 NOTE — MAU Provider Note (Signed)
`````  Attestation of Attending Supervision of Advanced Practitioner: Evaluation and management procedures were performed by the PA/NP/CNM/OB Fellow under my supervision/collaboration. Chart reviewed and agree with management and plan.  Chevella Pearce V 01/15/2014 4:58 PM

## 2014-02-17 ENCOUNTER — Encounter: Payer: Self-pay | Admitting: Obstetrics & Gynecology

## 2014-04-03 ENCOUNTER — Emergency Department (HOSPITAL_COMMUNITY)
Admission: EM | Admit: 2014-04-03 | Discharge: 2014-04-04 | Discharge status: Against Medical Advice | Payer: Self-pay | Attending: Emergency Medicine | Admitting: Emergency Medicine

## 2014-04-03 ENCOUNTER — Encounter (HOSPITAL_COMMUNITY): Payer: Self-pay | Admitting: Emergency Medicine

## 2014-04-03 DIAGNOSIS — R110 Nausea: Secondary | ICD-10-CM | POA: Insufficient documentation

## 2014-04-03 DIAGNOSIS — I10 Essential (primary) hypertension: Secondary | ICD-10-CM | POA: Insufficient documentation

## 2014-04-03 DIAGNOSIS — F17200 Nicotine dependence, unspecified, uncomplicated: Secondary | ICD-10-CM | POA: Insufficient documentation

## 2014-04-03 DIAGNOSIS — R109 Unspecified abdominal pain: Secondary | ICD-10-CM | POA: Insufficient documentation

## 2014-04-03 DIAGNOSIS — J45909 Unspecified asthma, uncomplicated: Secondary | ICD-10-CM | POA: Insufficient documentation

## 2014-04-03 LAB — URINALYSIS, ROUTINE W REFLEX MICROSCOPIC
GLUCOSE, UA: NEGATIVE mg/dL
Ketones, ur: 15 mg/dL — AB
Nitrite: POSITIVE — AB
PH: 5.5 (ref 5.0–8.0)
Protein, ur: 100 mg/dL — AB
Urobilinogen, UA: 1 mg/dL (ref 0.0–1.0)

## 2014-04-03 LAB — COMPREHENSIVE METABOLIC PANEL
ALK PHOS: 48 U/L (ref 39–117)
ALT: 8 U/L (ref 0–35)
AST: 15 U/L (ref 0–37)
Albumin: 3.4 g/dL — ABNORMAL LOW (ref 3.5–5.2)
BUN: 11 mg/dL (ref 6–23)
CO2: 22 mEq/L (ref 19–32)
Calcium: 9.4 mg/dL (ref 8.4–10.5)
Chloride: 105 mEq/L (ref 96–112)
Creatinine, Ser: 0.95 mg/dL (ref 0.50–1.10)
GFR calc Af Amer: 88 mL/min — ABNORMAL LOW (ref >90)
GFR calc non Af Amer: 76 mL/min — ABNORMAL LOW (ref >90)
GLUCOSE: 87 mg/dL (ref 70–99)
POTASSIUM: 3.7 meq/L (ref 3.7–5.3)
SODIUM: 140 meq/L (ref 137–147)
TOTAL PROTEIN: 6.4 g/dL (ref 6.0–8.3)
Total Bilirubin: <0.2 mg/dL — ABNORMAL LOW (ref 0.3–1.2)

## 2014-04-03 LAB — CBC WITH DIFFERENTIAL/PLATELET
Basophils Absolute: 0 10*3/uL (ref 0.0–0.1)
Basophils Relative: 1 % (ref 0–1)
Eosinophils Absolute: 0.6 10*3/uL (ref 0.0–0.7)
Eosinophils Relative: 9 % — ABNORMAL HIGH (ref 0–5)
HCT: 36.8 % (ref 36.0–46.0)
Hemoglobin: 12.4 g/dL (ref 12.0–15.0)
LYMPHS ABS: 2.8 10*3/uL (ref 0.7–4.0)
Lymphocytes Relative: 46 % (ref 12–46)
MCH: 31.2 pg (ref 26.0–34.0)
MCHC: 33.7 g/dL (ref 30.0–36.0)
MCV: 92.7 fL (ref 78.0–100.0)
Monocytes Absolute: 0.5 10*3/uL (ref 0.1–1.0)
Monocytes Relative: 8 % (ref 3–12)
NEUTROS PCT: 36 % — AB (ref 43–77)
Neutro Abs: 2.1 10*3/uL (ref 1.7–7.7)
PLATELETS: 357 10*3/uL (ref 150–400)
RBC: 3.97 MIL/uL (ref 3.87–5.11)
RDW: 12.9 % (ref 11.5–15.5)
WBC: 6 10*3/uL (ref 4.0–10.5)

## 2014-04-03 LAB — URINE MICROSCOPIC-ADD ON

## 2014-04-03 LAB — LIPASE, BLOOD: Lipase: 37 U/L (ref 11–59)

## 2014-04-03 NOTE — ED Notes (Signed)
Rt. Lower abd. Pain since this morning. Some nausea.

## 2014-04-07 ENCOUNTER — Emergency Department (HOSPITAL_COMMUNITY): Payer: Self-pay

## 2014-04-07 ENCOUNTER — Emergency Department (HOSPITAL_COMMUNITY)
Admission: EM | Admit: 2014-04-07 | Discharge: 2014-04-07 | Disposition: A | Discharge status: Home / Self Care | Payer: Self-pay | Attending: Emergency Medicine | Admitting: Emergency Medicine

## 2014-04-07 ENCOUNTER — Encounter (HOSPITAL_COMMUNITY): Payer: Self-pay | Admitting: Emergency Medicine

## 2014-04-07 DIAGNOSIS — J45901 Unspecified asthma with (acute) exacerbation: Secondary | ICD-10-CM | POA: Insufficient documentation

## 2014-04-07 DIAGNOSIS — Z8719 Personal history of other diseases of the digestive system: Secondary | ICD-10-CM | POA: Insufficient documentation

## 2014-04-07 DIAGNOSIS — IMO0001 Reserved for inherently not codable concepts without codable children: Secondary | ICD-10-CM | POA: Insufficient documentation

## 2014-04-07 DIAGNOSIS — F411 Generalized anxiety disorder: Secondary | ICD-10-CM | POA: Insufficient documentation

## 2014-04-07 DIAGNOSIS — F17200 Nicotine dependence, unspecified, uncomplicated: Secondary | ICD-10-CM | POA: Insufficient documentation

## 2014-04-07 DIAGNOSIS — I10 Essential (primary) hypertension: Secondary | ICD-10-CM | POA: Insufficient documentation

## 2014-04-07 DIAGNOSIS — R0789 Other chest pain: Secondary | ICD-10-CM

## 2014-04-07 DIAGNOSIS — M549 Dorsalgia, unspecified: Secondary | ICD-10-CM | POA: Insufficient documentation

## 2014-04-07 DIAGNOSIS — Z862 Personal history of diseases of the blood and blood-forming organs and certain disorders involving the immune mechanism: Secondary | ICD-10-CM | POA: Insufficient documentation

## 2014-04-07 DIAGNOSIS — Z79899 Other long term (current) drug therapy: Secondary | ICD-10-CM | POA: Insufficient documentation

## 2014-04-07 LAB — CBC WITH DIFFERENTIAL/PLATELET
Basophils Absolute: 0.1 10*3/uL (ref 0.0–0.1)
Basophils Relative: 1 % (ref 0–1)
Eosinophils Absolute: 0.7 10*3/uL (ref 0.0–0.7)
Eosinophils Relative: 8 % — ABNORMAL HIGH (ref 0–5)
HCT: 35.4 % — ABNORMAL LOW (ref 36.0–46.0)
Hemoglobin: 12 g/dL (ref 12.0–15.0)
LYMPHS ABS: 5.1 10*3/uL — AB (ref 0.7–4.0)
LYMPHS PCT: 57 % — AB (ref 12–46)
MCH: 30.9 pg (ref 26.0–34.0)
MCHC: 33.9 g/dL (ref 30.0–36.0)
MCV: 91.2 fL (ref 78.0–100.0)
Monocytes Absolute: 0.6 10*3/uL (ref 0.1–1.0)
Monocytes Relative: 7 % (ref 3–12)
Neutro Abs: 2.4 10*3/uL (ref 1.7–7.7)
Neutrophils Relative %: 27 % — ABNORMAL LOW (ref 43–77)
PLATELETS: 394 10*3/uL (ref 150–400)
RBC: 3.88 MIL/uL (ref 3.87–5.11)
RDW: 13 % (ref 11.5–15.5)
WBC: 8.9 10*3/uL (ref 4.0–10.5)

## 2014-04-07 LAB — COMPREHENSIVE METABOLIC PANEL
ALT: 8 U/L (ref 0–35)
AST: 15 U/L (ref 0–37)
Albumin: 3.8 g/dL (ref 3.5–5.2)
Alkaline Phosphatase: 54 U/L (ref 39–117)
BUN: 7 mg/dL (ref 6–23)
CO2: 23 mEq/L (ref 19–32)
Calcium: 9.8 mg/dL (ref 8.4–10.5)
Chloride: 104 mEq/L (ref 96–112)
Creatinine, Ser: 0.85 mg/dL (ref 0.50–1.10)
GFR, EST NON AFRICAN AMERICAN: 86 mL/min — AB (ref >90)
GLUCOSE: 85 mg/dL (ref 70–99)
POTASSIUM: 3.6 meq/L — AB (ref 3.7–5.3)
SODIUM: 142 meq/L (ref 137–147)
TOTAL PROTEIN: 6.7 g/dL (ref 6.0–8.3)
Total Bilirubin: 0.2 mg/dL — ABNORMAL LOW (ref 0.3–1.2)

## 2014-04-07 LAB — TROPONIN I: Troponin I: <0.3 ng/mL (ref <0.30)

## 2014-04-07 MED ORDER — ALBUTEROL (5 MG/ML) CONTINUOUS INHALATION SOLN
10.0000 mg/h | INHALATION_SOLUTION | RESPIRATORY_TRACT | Status: DC
Start: 2014-04-07 — End: 2014-04-07
  Administered 2014-04-07: 10 mg/h via RESPIRATORY_TRACT
  Filled 2014-04-07: qty 80

## 2014-04-07 MED ORDER — TRAMADOL HCL 50 MG PO TABS: 50.0000 mg | ORAL_TABLET | Freq: Four times a day (QID) | ORAL | Status: DC | PRN

## 2014-04-07 MED ORDER — IBUPROFEN 600 MG PO TABS
600.0000 mg | ORAL_TABLET | Freq: Four times a day (QID) | ORAL | Status: DC | PRN
Start: 2014-04-07 — End: 2014-04-23

## 2014-04-07 MED ORDER — METHYLPREDNISOLONE SODIUM SUCC 125 MG IJ SOLR
125.0000 mg | Freq: Once | INTRAMUSCULAR | Status: AC
  Administered 2014-04-07: 125 mg via INTRAVENOUS
  Filled 2014-04-07: qty 2

## 2014-04-07 MED ORDER — BENZONATATE 100 MG PO CAPS: 100.0000 mg | ORAL_CAPSULE | Freq: Three times a day (TID) | ORAL | Status: DC

## 2014-04-07 MED ORDER — LORAZEPAM 2 MG/ML IJ SOLN
0.5000 mg | Freq: Once | INTRAMUSCULAR | Status: AC
  Administered 2014-04-07: 0.5 mg via INTRAVENOUS
  Filled 2014-04-07: qty 1

## 2014-04-07 MED ORDER — KETOROLAC TROMETHAMINE 30 MG/ML IJ SOLN
30.0000 mg | Freq: Once | INTRAMUSCULAR | Status: AC
  Administered 2014-04-07: 30 mg via INTRAVENOUS
  Filled 2014-04-07: qty 1

## 2014-04-07 MED ORDER — METHOCARBAMOL 500 MG PO TABS: 500.0000 mg | ORAL_TABLET | Freq: Two times a day (BID) | ORAL | Status: DC

## 2014-04-07 MED FILL — Albuterol Sulfate Soln Nebu 0.5% (5 MG/ML): RESPIRATORY_TRACT | Qty: 20 | Status: AC

## 2014-04-07 NOTE — Discharge Instructions (Signed)
Asthma, Adult Asthma is a recurring condition in which the airways tighten and narrow. Asthma can make it difficult to breathe. It can cause coughing, wheezing, and shortness of breath. Asthma episodes (also called asthma attacks) range from minor to life-threatening. Asthma cannot be cured, but medicines and lifestyle changes can help control it. CAUSES Asthma is believed to be caused by inherited (genetic) and environmental factors, but its exact cause is unknown. Asthma may be triggered by allergens, lung infections, or irritants in the air. Asthma triggers are different for each person. Common triggers include:   Animal dander.  Dust mites.  Cockroaches.  Pollen from trees or grass.  Mold.  Smoke.  Air pollutants such as dust, household cleaners, hair sprays, aerosol sprays, paint fumes, strong chemicals, or strong odors.  Cold air, weather changes, and winds (which increase molds and pollens in the air).  Strong emotional expressions such as crying or laughing hard.  Stress.  Certain medicines (such as aspirin) or types of drugs (such as beta-blockers).  Sulfites in foods and drinks. Foods and drinks that may contain sulfites include dried fruit, potato chips, and sparkling grape juice.  Infections or inflammatory conditions such as the flu, a cold, or an inflammation of the nasal membranes (rhinitis).  Gastroesophageal reflux disease (GERD).  Exercise or strenuous activity. SYMPTOMS Symptoms may occur immediately after asthma is triggered or many hours later. Symptoms include:  Wheezing.  Excessive nighttime or early morning coughing.  Frequent or severe coughing with a common cold.  Chest tightness.  Shortness of breath. DIAGNOSIS  The diagnosis of asthma is made by a review of your medical history and a physical exam. Tests may also be performed. These may include:  Lung function studies. These tests show how much air you breath in and out.  Allergy  tests.  Imaging tests such as X-rays. TREATMENT  Asthma cannot be cured, but it can usually be controlled. Treatment involves identifying and avoiding your asthma triggers. It also involves medicines. There are 2 classes of medicine used for asthma treatment:   Controller medicines. These prevent asthma symptoms from occurring. They are usually taken every day.  Reliever or rescue medicines. These quickly relieve asthma symptoms. They are used as needed and provide short-term relief. Your health care provider will help you create an asthma action plan. An asthma action plan is a written plan for managing and treating your asthma attacks. It includes a list of your asthma triggers and how they may be avoided. It also includes information on when medicines should be taken and when their dosage should be changed. An action plan may also involve the use of a device called a peak flow meter. A peak flow meter measures how well the lungs are working. It helps you monitor your condition. HOME CARE INSTRUCTIONS   Take medicine as directed by your health care provider. Speak with your health care provider if you have questions about how or when to take the medicines.  Use a peak flow meter as directed by your health care provider. Record and keep track of readings.  Understand and use the action plan to help minimize or stop an asthma attack without needing to seek medical care.  Control your home environment in the following ways to help prevent asthma attacks:  Do not smoke. Avoid being exposed to secondhand smoke.  Change your heating and air conditioning filter regularly.  Limit your use of fireplaces and wood stoves.  Get rid of pests (such as roaches and  mice) and their droppings.  Throw away plants if you see mold on them.  Clean your floors and dust regularly. Use unscented cleaning products.  Try to have someone else vacuum for you regularly. Stay out of rooms while they are being  vacuumed and for a short while afterward. If you vacuum, use a dust mask from a hardware store, a double-layered or microfilter vacuum cleaner bag, or a vacuum cleaner with a HEPA filter.  Replace carpet with wood, tile, or vinyl flooring. Carpet can trap dander and dust.  Use allergy-proof pillows, mattress covers, and box spring covers.  Wash bed sheets and blankets every week in hot water and dry them in a dryer.  Use blankets that are made of polyester or cotton.  Clean bathrooms and kitchens with bleach. If possible, have someone repaint the walls in these rooms with mold-resistant paint. Keep out of the rooms that are being cleaned and painted.  Wash hands frequently. SEEK MEDICAL CARE IF:   You have wheezing, shortness of breath, or a cough even if taking medicine to prevent attacks.  The colored mucus you cough up (sputum) is thicker than usual.  Your sputum changes from clear or white to yellow, green, gray, or bloody.  You have any problems that may be related to the medicines you are taking (such as a rash, itching, swelling, or trouble breathing).  You are using a reliever medicine more than 2 3 times per week.  Your peak flow is still at 50 79% of you personal best after following your action plan for 1 hour. SEEK IMMEDIATE MEDICAL CARE IF:   You seem to be getting worse and are unresponsive to treatment during an asthma attack.  You are short of breath even at rest.  You get short of breath when doing very little physical activity.  You have difficulty eating, drinking, or talking due to asthma symptoms.  You develop chest pain.  You develop a fast heartbeat.  You have a bluish color to your lips or fingernails.  You are lightheaded, dizzy, or faint.  Your peak flow is less than 50% of your personal best.  You have a fever or persistent symptoms for more than 2 3 days.  You have a fever and symptoms suddenly get worse. MAKE SURE YOU:   Understand these  instructions.  Will watch your condition.  Will get help right away if you are not doing well or get worse. Document Released: 12/05/2005 Document Revised: 08/07/2013 Document Reviewed: 07/04/2013 [facility] Patient Information 2014 [place], [place].  Chest Wall Pain Chest wall pain is pain in or around the bones and muscles of your chest. It may take up to 6 weeks to get better. It may take longer if you must stay physically active in your work and activities.  CAUSES  Chest wall pain may happen on its own. However, it may be caused by:  A viral illness like the flu.  Injury.  Coughing.  Exercise.  Arthritis.  Fibromyalgia.  Shingles. HOME CARE INSTRUCTIONS   Avoid overtiring physical activity. Try not to strain or perform activities that cause pain. This includes any activities using your chest or your abdominal and side muscles, especially if heavy weights are used.  Put ice on the sore area.  Put ice in a plastic bag.  Place a towel between your skin and the bag.  Leave the ice on for 15-20 minutes per hour while awake for the first 2 days.  Only take over-the-counter or prescription  medicines for pain, discomfort, or fever as directed by your caregiver. SEEK IMMEDIATE MEDICAL CARE IF:   Your pain increases, or you are very uncomfortable.  You have a fever.  Your chest pain becomes worse.  You have new, unexplained symptoms.  You have nausea or vomiting.  You feel sweaty or lightheaded.  You have a cough with phlegm (sputum), or you cough up blood. MAKE SURE YOU:   Understand these instructions.  Will watch your condition.  Will get help right away if you are not doing well or get worse. Document Released: 12/05/2005 Document Revised: 02/27/2012 Document Reviewed: 08/01/2011 [facility] Patient Information 2014 [place], [place].

## 2014-04-07 NOTE — ED Notes (Signed)
Patient presents to ED via POV. Patient states that at approx 0200 this morning she started having left sided chest "tightness" that radiated to her back between her shoulder blades. Pt has associated "shortness of breath" with chest tightness. No diaphoresis noted at this time.  Denies any n/v or diarrhea. Denies any dizziness or numbness. Patient presents tearful to treatment room. Rating pain 7/10. A&Ox4.

## 2014-04-07 NOTE — ED Provider Notes (Signed)
CSN: [id number]     Arrival date & time 04/07/14  0222 History   First MD Initiated Contact with Patient 04/07/14 [phone]     Chief Complaint  Patient presents with  . Chest Pain     (Consider location/radiation/quality/duration/timing/severity/associated sxs/prior Treatment) HPI Patient has had a day of increased shortness of breath and wheezing. She states she used her rescue inhaler 6 times today. She was wrestling with for her niece this evening and began having left-sided chest pain and left sided thoracic back pain shortly after. The pain is worse with movement and palpation. She states she's also having shortness of breath and wheezing. She denies any lower extremity swelling or pain. She's had no recent hospitalizations, surgeries or extended travel. She's had no new lower extremity swelling. She denies any calf pain. She's had no fevers or chills. Past Medical History  Diagnosis Date  . Bipolar 1 disorder, depressed, mild   . Migraines   . GERD (gastroesophageal reflux disease)   . Bilateral ovarian cysts   . Asthma   . Pregnancy induced hypertension   . HTN (hypertension)   . Anemia    Past Surgical History  Procedure Laterality Date  . Cesarean section    . Induced abortion    . Tubal ligation     No family history on file. History  Substance Use Topics  . Smoking status: Current Some Day Smoker -- 0.50 packs/day    Types: Cigarettes  . Smokeless tobacco: Never Used  . Alcohol Use: Yes     Comment: every other week   OB History   Grav Para Term Preterm Abortions TAB SAB Ect Mult Living   3 2 1 1 1 1    2      Review of Systems  Constitutional: Negative for fever and chills.  HENT: Negative for congestion and sore throat.   Respiratory: Positive for cough, shortness of breath and wheezing.   Cardiovascular: Positive for chest pain. Negative for palpitations and leg swelling.  Gastrointestinal: Negative for nausea, vomiting, abdominal pain and diarrhea.   Musculoskeletal: Positive for back pain and myalgias. Negative for neck pain and neck stiffness.  Skin: Negative for rash and wound.  Neurological: Negative for dizziness, weakness, light-headedness, numbness and headaches.  All other systems reviewed and are negative.     Allergies  Prednisone  Home Medications   Prior to Admission medications   Medication Sig Start Date End Date Taking? Authorizing Provider  albuterol (PROVENTIL HFA;VENTOLIN HFA) 108 (90 BASE) MCG/ACT inhaler Inhale 2 puffs into the lungs every 6 (six) hours as needed for wheezing or shortness of breath.    Historical Provider, MD  hydrochlorothiazide (HYDRODIURIL) 25 MG tablet Take 25 mg by mouth daily.    Historical Provider, MD   BP 148/99  Pulse 69  Temp(Src) 98.5 F (36.9 C) (Oral)  Resp 14  Ht 5' (1.524 m)  Wt 160 lb (72.576 kg)  BMI 31.25 kg/m2  SpO2 100%  LMP 04/01/2014 Physical Exam  Nursing note and vitals reviewed. Constitutional: She is oriented to person, place, and time. She appears well-developed and well-nourished. No distress.  Anxious appearing  HENT:  Head: Normocephalic and atraumatic.  Mouth/Throat: Oropharynx is clear and moist.  Eyes: EOM are normal. Pupils are equal, round, and reactive to light.  Neck: Normal range of motion. Neck supple.  Cardiovascular: Normal rate and regular rhythm.   Pulmonary/Chest: Effort normal. No respiratory distress. She has wheezes (inspiratory and expiratory wheezing in all lung fields.). She  has no rales. She exhibits tenderness (chest pain is completely reproduced with palpation over the lateral left pectoralis muscle. There is no crepitance or deformity.).  Abdominal: Soft. Bowel sounds are normal. She exhibits no distension and no mass. There is no tenderness. There is no rebound and no guarding.  Musculoskeletal: Normal range of motion. She exhibits tenderness (tenderness to palpation over the left sided rhomboids and infraspinatus muscles. No  midline thoracic or lumbar tenderness.). She exhibits no edema.  Neurological: She is alert and oriented to person, place, and time.  Moves all extremities without deficit. Sensation is grossly intact.  Skin: Skin is warm and dry. No rash noted. No erythema.  Psychiatric: She has a normal mood and affect. Her behavior is normal.    ED Course  Procedures (including critical care time) Labs Review Labs Reviewed  CBC WITH DIFFERENTIAL  COMPREHENSIVE METABOLIC PANEL  TROPONIN I    Imaging Review No results found.   EKG Interpretation None      MDM   Final diagnoses:  None    Patient with reproduced tenderness with palpation of the chest and thoracic back. I suspect this is all musculoskeletal pain. Patient also has wheezing throughout. We'll treat for asthma exacerbation issued x-ray to rule out underlying pulmonary pathology.   Patient's wheezing is completely resolved. She has a normal chest x-ray and EKG. Chest pain is mildly improved. Continues to be easily reproduced with palpation consistent with chest wall pain. Discharge patient home. Given return precautions.  Julianne Rice, MD 04/07/14 [phone]

## 2014-04-21 ENCOUNTER — Ambulatory Visit: Payer: Self-pay

## 2014-04-23 ENCOUNTER — Encounter (HOSPITAL_COMMUNITY): Payer: Self-pay | Admitting: Emergency Medicine

## 2014-04-23 ENCOUNTER — Emergency Department (HOSPITAL_COMMUNITY)
Admission: EM | Admit: 2014-04-23 | Discharge: 2014-04-23 | Disposition: A | Discharge status: Home / Self Care | Payer: Self-pay | Attending: Emergency Medicine | Admitting: Emergency Medicine

## 2014-04-23 DIAGNOSIS — Z87891 Personal history of nicotine dependence: Secondary | ICD-10-CM | POA: Insufficient documentation

## 2014-04-23 DIAGNOSIS — Z8719 Personal history of other diseases of the digestive system: Secondary | ICD-10-CM | POA: Insufficient documentation

## 2014-04-23 DIAGNOSIS — J45901 Unspecified asthma with (acute) exacerbation: Secondary | ICD-10-CM | POA: Insufficient documentation

## 2014-04-23 DIAGNOSIS — Z8659 Personal history of other mental and behavioral disorders: Secondary | ICD-10-CM | POA: Insufficient documentation

## 2014-04-23 DIAGNOSIS — Z862 Personal history of diseases of the blood and blood-forming organs and certain disorders involving the immune mechanism: Secondary | ICD-10-CM | POA: Insufficient documentation

## 2014-04-23 DIAGNOSIS — Z79899 Other long term (current) drug therapy: Secondary | ICD-10-CM | POA: Insufficient documentation

## 2014-04-23 DIAGNOSIS — Z8639 Personal history of other endocrine, nutritional and metabolic disease: Secondary | ICD-10-CM | POA: Insufficient documentation

## 2014-04-23 DIAGNOSIS — I10 Essential (primary) hypertension: Secondary | ICD-10-CM | POA: Insufficient documentation

## 2014-04-23 DIAGNOSIS — Z8679 Personal history of other diseases of the circulatory system: Secondary | ICD-10-CM | POA: Insufficient documentation

## 2014-04-23 MED ORDER — METHYLPREDNISOLONE SODIUM SUCC 125 MG IJ SOLR
125.0000 mg | Freq: Once | INTRAMUSCULAR | Status: AC
  Administered 2014-04-23: 125 mg via INTRAVENOUS
  Filled 2014-04-23: qty 2

## 2014-04-23 MED ORDER — IBUPROFEN 800 MG PO TABS
800.0000 mg | ORAL_TABLET | Freq: Once | ORAL | Status: AC
  Administered 2014-04-23: 800 mg via ORAL
  Filled 2014-04-23: qty 1

## 2014-04-23 MED ORDER — ALBUTEROL SULFATE (2.5 MG/3ML) 0.083% IN NEBU: 2.5000 mg | INHALATION_SOLUTION | RESPIRATORY_TRACT | Status: DC | PRN

## 2014-04-23 MED ORDER — ALBUTEROL SULFATE HFA 108 (90 BASE) MCG/ACT IN AERS
2.0000 | INHALATION_SPRAY | Freq: Once | RESPIRATORY_TRACT | Status: AC
  Administered 2014-04-23: 2 via RESPIRATORY_TRACT
  Filled 2014-04-23: qty 6.7

## 2014-04-23 MED ORDER — FLUTICASONE PROPIONATE HFA 44 MCG/ACT IN AERO: 1.0000 | INHALATION_SPRAY | Freq: Two times a day (BID) | RESPIRATORY_TRACT | Status: DC

## 2014-04-23 MED ORDER — IPRATROPIUM BROMIDE 0.02 % IN SOLN
0.5000 mg | Freq: Once | RESPIRATORY_TRACT | Status: AC
  Administered 2014-04-23: 0.5 mg via RESPIRATORY_TRACT
  Filled 2014-04-23: qty 2.5

## 2014-04-23 MED ORDER — ALBUTEROL SULFATE (2.5 MG/3ML) 0.083% IN NEBU
5.0000 mg | INHALATION_SOLUTION | Freq: Once | RESPIRATORY_TRACT | Status: AC
  Administered 2014-04-23: 5 mg via RESPIRATORY_TRACT
  Filled 2014-04-23: qty 6

## 2014-04-23 MED ORDER — DEXAMETHASONE SODIUM PHOSPHATE 10 MG/ML IJ SOLN
10.0000 mg | Freq: Once | INTRAMUSCULAR | Status: AC
  Administered 2014-04-23: 10 mg via INTRAMUSCULAR
  Filled 2014-04-23: qty 1

## 2014-04-23 NOTE — Progress Notes (Signed)
  CARE MANAGEMENT ED NOTE 04/23/2014  Patient:  [name], [name]   Account Number:  [id number]  Date Initiated:  04/23/2014  Documentation initiated by:  Jackelyn Poling  Subjective/Objective Assessment:   38 yr old self pay [place] resident confirms no pcp nor insurnce CM consult for Primary care/ orange card this pt has been seen by [facility] CMs x 2 & provided same information in august & september 2014     Subjective/Objective Assessment Detail:   had her apartment exterminated in her new place three weeks ago and has trouble with her asthma esp at night. She states that last night she got very shob and used her inhaler and neb treatment but this morning still was worse and used her inhaler this but didn't use ned treatment this morning.  Pt very tachypneac upon triaging.  pt has cough and states is clear mucous. Pt also c/o central chest pain, that is burning.  Pt states she tried to get an appt at [facility] wellness but was told it would be a few months so she did not make an appt  Pt to be sent home with albuterol inhaler used in [facility] ED until able to get to [facility]     Action/Plan:   ED Cm noted Cm consult Cm reviewed EPIC chart. CM spoke with pt   Action/Plan Detail:   Cherylann Banas at [facility] wellness intake who will contact pt at her home number to schedule her appt Cm sent referral to [facility] Provided with [employer] application for orange card Encourage to go to [facility] wellness for pcp services & orange card as walk in   Anticipated DC Date:  04/23/2014     Status Recommendation to Physician:   Result of Recommendation:    Other ED [place]  Other  PCP issues  Outpatient Services - Pt will follow up  [facility] / [facility] (established/new)  Medication Assistance    Choice offered to / List presented to:            Status of service:  Completed, signed off  ED Comments:   ED Comments Detail:  Pt given [email or web address] patient assistance application for  albuterol to complete and send back for assisstance with getting medication Cm encouraged and discussed use of albuterol per nebulizer vs inhaler for cost efficiency Pt reported not being able to afford a nebulizer Cm called  a few pharmacies ([facility], [facility], [facility]) All pharmacies out of nebulizers. Called [facility], [facility]ice who has them at [address] store for $68 and [facility] who has them at $49.95+ Cm provided with contact information for these DME suppliers.

## 2014-04-23 NOTE — ED Notes (Signed)
Pt states that she had her apartment exterminated in her new place three weeks ago and has trouble with her asthma esp at night. She states that last night she got very shob and used her inhaler and neb treatment but this morning still was worse and used her inhaler this but didn't use ned treatment this morning.  Pt very tachypneac upon triaging.  pt has cough and states is clear mucous. Pt also c/o central chest pain, that is burning.

## 2014-04-23 NOTE — Discharge Instructions (Signed)
Read the information below.  Use the prescribed medication as directed.  Please discuss all new medications with your pharmacist.  You may return to the Emergency Department at any time for worsening condition or any new symptoms that concern you.  If there is any possibility that you might be pregnant, please let your health care provider know and discuss this with the pharmacist to ensure medication safety.  If you develop worsening shortness of breath, uncontrolled wheezing, severe chest pain, or fevers despite using tylenol and/or ibuprofen, return for a recheck.       Asthma, Adult Asthma is a recurring condition in which the airways tighten and narrow. Asthma can make it difficult to breathe. It can cause coughing, wheezing, and shortness of breath. Asthma episodes (also called asthma attacks) range from minor to life-threatening. Asthma cannot be cured, but medicines and lifestyle changes can help control it. CAUSES Asthma is believed to be caused by inherited (genetic) and environmental factors, but its exact cause is unknown. Asthma may be triggered by allergens, lung infections, or irritants in the air. Asthma triggers are different for each person. Common triggers include:   Animal dander.  Dust mites.  Cockroaches.  Pollen from trees or grass.  Mold.  Smoke.  Air pollutants such as dust, household cleaners, hair sprays, aerosol sprays, paint fumes, strong chemicals, or strong odors.  Cold air, weather changes, and winds (which increase molds and pollens in the air).  Strong emotional expressions such as crying or laughing hard.  Stress.  Certain medicines (such as aspirin) or types of drugs (such as beta-blockers).  Sulfites in foods and drinks. Foods and drinks that may contain sulfites include dried fruit, potato chips, and sparkling grape juice.  Infections or inflammatory conditions such as the flu, a cold, or an inflammation of the nasal membranes  (rhinitis).  Gastroesophageal reflux disease (GERD).  Exercise or strenuous activity. SYMPTOMS Symptoms may occur immediately after asthma is triggered or many hours later. Symptoms include:  Wheezing.  Excessive nighttime or early morning coughing.  Frequent or severe coughing with a common cold.  Chest tightness.  Shortness of breath. DIAGNOSIS  The diagnosis of asthma is made by a review of your medical history and a physical exam. Tests may also be performed. These may include:  Lung function studies. These tests show how much air you breath in and out.  Allergy tests.  Imaging tests such as X-rays. TREATMENT  Asthma cannot be cured, but it can usually be controlled. Treatment involves identifying and avoiding your asthma triggers. It also involves medicines. There are 2 classes of medicine used for asthma treatment:   Controller medicines. These prevent asthma symptoms from occurring. They are usually taken every day.  Reliever or rescue medicines. These quickly relieve asthma symptoms. They are used as needed and provide short-term relief. Your health care provider will help you create an asthma action plan. An asthma action plan is a written plan for managing and treating your asthma attacks. It includes a list of your asthma triggers and how they may be avoided. It also includes information on when medicines should be taken and when their dosage should be changed. An action plan may also involve the use of a device called a peak flow meter. A peak flow meter measures how well the lungs are working. It helps you monitor your condition. HOME CARE INSTRUCTIONS   Take medicine as directed by your health care provider. Speak with your health care provider if you have questions  about how or when to take the medicines.  Use a peak flow meter as directed by your health care provider. Record and keep track of readings.  Understand and use the action plan to help minimize or stop  an asthma attack without needing to seek medical care.  Control your home environment in the following ways to help prevent asthma attacks:  Do not smoke. Avoid being exposed to secondhand smoke.  Change your heating and air conditioning filter regularly.  Limit your use of fireplaces and wood stoves.  Get rid of pests (such as roaches and mice) and their droppings.  Throw away plants if you see mold on them.  Clean your floors and dust regularly. Use unscented cleaning products.  Try to have someone else vacuum for you regularly. Stay out of rooms while they are being vacuumed and for a short while afterward. If you vacuum, use a dust mask from a hardware store, a double-layered or microfilter vacuum cleaner bag, or a vacuum cleaner with a HEPA filter.  Replace carpet with wood, tile, or vinyl flooring. Carpet can trap dander and dust.  Use allergy-proof pillows, mattress covers, and box spring covers.  Wash bed sheets and blankets every week in hot water and dry them in a dryer.  Use blankets that are made of polyester or cotton.  Clean bathrooms and kitchens with bleach. If possible, have someone repaint the walls in these rooms with mold-resistant paint. Keep out of the rooms that are being cleaned and painted.  Wash hands frequently. SEEK MEDICAL CARE IF:   You have wheezing, shortness of breath, or a cough even if taking medicine to prevent attacks.  The colored mucus you cough up (sputum) is thicker than usual.  Your sputum changes from clear or white to yellow, green, gray, or bloody.  You have any problems that may be related to the medicines you are taking (such as a rash, itching, swelling, or trouble breathing).  You are using a reliever medicine more than 2 3 times per week.  Your peak flow is still at 50 79% of you personal best after following your action plan for 1 hour. SEEK IMMEDIATE MEDICAL CARE IF:   You seem to be getting worse and are unresponsive to  treatment during an asthma attack.  You are short of breath even at rest.  You get short of breath when doing very little physical activity.  You have difficulty eating, drinking, or talking due to asthma symptoms.  You develop chest pain.  You develop a fast heartbeat.  You have a bluish color to your lips or fingernails.  You are lightheaded, dizzy, or faint.  Your peak flow is less than 50% of your personal best.  You have a fever or persistent symptoms for more than 2 3 days.  You have a fever and symptoms suddenly get worse. MAKE SURE YOU:   Understand these instructions.  Will watch your condition.  Will get help right away if you are not doing well or get worse. Document Released: 12/05/2005 Document Revised: 08/07/2013 Document Reviewed: 07/04/2013 [facility] Patient Information 2014 [place], [place].  Asthma Attack Prevention Although there is no way to prevent asthma from starting, you can take steps to control the disease and reduce its symptoms. Learn about your asthma and how to control it. Take an active role to control your asthma by working with your health care provider to create and follow an asthma action plan. An asthma action plan guides you in:  Taking your medicines properly.  Avoiding things that set off your asthma or make your asthma worse (asthma triggers).  Tracking your level of asthma control.  Responding to worsening asthma.  Seeking emergency care when needed. To track your asthma, keep records of your symptoms, check your peak flow number using a handheld device that shows how well air moves out of your lungs (peak flow meter), and get regular asthma checkups.  WHAT ARE SOME WAYS TO PREVENT AN ASTHMA ATTACK?  Take medicines as directed by your health care provider.  Keep track of your asthma symptoms and level of control.  With your health care provider, write a detailed plan for taking medicines and managing an asthma attack. Then be  sure to follow your action plan. Asthma is an ongoing condition that needs regular monitoring and treatment.  Identify and avoid asthma triggers. Many outdoor allergens and irritants (such as pollen, mold, cold air, and air pollution) can trigger asthma attacks. Find out what your asthma triggers are and take steps to avoid them.  Monitor your breathing. Learn to recognize warning signs of an attack, such as coughing, wheezing, or shortness of breath. Your lung function may decrease before you notice any signs or symptoms, so regularly measure and record your peak airflow with a home peak flow meter.  Identify and treat attacks early. If you act quickly, you are less likely to have a severe attack. You will also need less medicine to control your symptoms. When your peak flow measurements decrease and alert you to an upcoming attack, take your medicine as instructed and immediately stop any activity that may have triggered the attack. If your symptoms do not improve, get medical help.  Pay attention to increasing quick-relief inhaler use. If you find yourself relying on your quick-relief inhaler, your asthma is not under control. See your health care provider about adjusting your treatment. WHAT CAN MAKE MY SYMPTOMS WORSE? A number of common things can set off or make your asthma symptoms worse and cause temporary increased inflammation of your airways. Keep track of your asthma symptoms for several weeks, detailing all the environmental and emotional factors that are linked with your asthma. When you have an asthma attack, go back to your asthma diary to see which factor, or combination of factors, might have contributed to it. Once you know what these factors are, you can take steps to control many of them. If you have allergies and asthma, it is important to take asthma prevention steps at home. Minimizing contact with the substance to which you are allergic will help prevent an asthma attack. Some  triggers and ways to avoid these triggers are: Animal Dander:  Some people are allergic to the flakes of skin or dried saliva from animals with fur or feathers.   There is no such thing as a hypoallergenic dog or cat breed. All dogs or cats can cause allergies, even if they don't shed.  Keep these pets out of your home.  If you are not able to keep a pet outdoors, keep the pet out of your bedroom and other sleeping areas at all times, and keep the door closed.  Remove carpets and furniture covered with cloth from your home. If that is not possible, keep the pet away from fabric-covered furniture and carpets. Dust Mites: Many people with asthma are allergic to dust mites. Dust mites are tiny bugs that are found in every home in mattresses, pillows, carpets, fabric-covered furniture, bedcovers, clothes, stuffed toys, and  other fabric-covered items.   Cover your mattress in a special dust-proof cover.  Cover your pillow in a special dust-proof cover, or wash the pillow each week in hot water. Water must be hotter than 130 F (54.4 C) to kill dust mites. Cold or warm water used with detergent and bleach can also be effective.  Wash the sheets and blankets on your bed each week in hot water.  Try not to sleep or lie on cloth-covered cushions.  Call ahead when traveling and ask for a smoke-free hotel room. Bring your own bedding and pillows in case the hotel only supplies feather pillows and down comforters, which may contain dust mites and cause asthma symptoms.  Remove carpets from your bedroom and those laid on concrete, if you can.  Keep stuffed toys out of the bed, or wash the toys weekly in hot water or cooler water with detergent and bleach. Cockroaches: Many people with asthma are allergic to the droppings and remains of cockroaches.   Keep food and garbage in closed containers. Never leave food out.  Use poison baits, traps, powders, gels, or paste (for example, boric acid).  If  a spray is used to kill cockroaches, stay out of the room until the odor goes away. Indoor Mold:  Fix leaky faucets, pipes, or other sources of water that have mold around them.  Clean floors and moldy surfaces with a fungicide or diluted bleach.  Avoid using humidifiers, vaporizers, or swamp coolers. These can spread molds through the air. Pollen and Outdoor Mold:  When pollen or mold spore counts are high, try to keep your windows closed.  Stay indoors with windows closed from late morning to afternoon. Pollen and some mold spore counts are highest at that time.  Ask your health care provider whether you need to take anti-inflammatory medicine or increase your dose of the medicine before your allergy season starts. Other Irritants to Avoid:  Tobacco smoke is an irritant. If you smoke, ask your health care provider how you can quit. Ask family members to quit smoking too. Do not allow smoking in your home or car.  If possible, do not use a wood-burning stove, kerosene heater, or fireplace. Minimize exposure to all sources of smoke, including to incense, candles, fires, and fireworks.  Try to stay away from strong odors and sprays, such as perfume, talcum powder, hair spray, and paints.  Decrease humidity in your home and use an indoor air cleaning device. Reduce indoor humidity to below 60%. Dehumidifiers or central air conditioners can do this.  Decrease house dust exposure by changing furnace and air cooler filters frequently.  Try to have someone else vacuum for you once or twice a week. Stay out of rooms while they are being vacuumed and for a short while afterward.  If you vacuum, use a dust mask from a hardware store, a double-layered or microfilter vacuum cleaner bag, or a vacuum cleaner with a HEPA filter.  Sulfites in foods and beverages can be irritants. Do not drink beer or wine or eat dried fruit, processed potatoes, or shrimp if they cause asthma symptoms.  Cold air can  trigger an asthma attack. Cover your nose and mouth with a scarf on cold or windy days.  Several health conditions can make asthma more difficult to manage, including a runny nose, sinus infections, reflux disease, psychological stress, and sleep apnea. Work with your health care provider to manage these conditions.  Avoid close contact with people who have a respiratory  infection such as a cold or the flu, since your asthma symptoms may get worse if you catch the infection. Wash your hands thoroughly after touching items that may have been handled by people with a respiratory infection.  Get a flu shot every year to protect against the flu virus, which often makes asthma worse for days or weeks. Also get a pneumonia shot if you have not previously had one. Unlike the flu shot, the pneumonia shot does not need to be given yearly. Medicines:  Talk to your health care provider about whether it is safe for you to take aspirin or non-steroidal anti-inflammatory medicines (NSAIDs). In a small number of people with asthma, aspirin and NSAIDs can cause asthma attacks. These medicines must be avoided by people who have known aspirin-sensitive asthma. It is important that people with aspirin-sensitive asthma read labels of all over-the-counter medicines used to treat pain, colds, coughs, and fever.  Beta blockers and ACE inhibitors are other medicines you should discuss with your health care provider. HOW CAN I FIND OUT WHAT I AM ALLERGIC TO? Ask your asthma health care provider about allergy skin testing or blood testing (the RAST test) to identify the allergens to which you are sensitive. If you are found to have allergies, the most important thing to do is to try to avoid exposure to any allergens that you are sensitive to as much as possible. Other treatments for allergies, such as medicines and allergy shots (immunotherapy) are available.  CAN I EXERCISE? Follow your health care provider's advice  regarding asthma treatment before exercising. It is important to maintain a regular exercise program, but vigorous exercise, or exercise in cold, humid, or dry environments can cause asthma attacks, especially for those people who have exercise-induced asthma. Document Released: 11/23/2009 Document Revised: 08/07/2013 Document Reviewed: 06/12/2013 [facility]ExitCare Patient Information 2014 [place], [place].

## 2014-04-23 NOTE — ED Provider Notes (Signed)
CSN: [id number]     Arrival date & time 04/23/14  1033 History   First MD Initiated Contact with Patient 04/23/14 1043     Chief Complaint  Patient presents with  . Shortness of Breath     (Consider location/radiation/quality/duration/timing/severity/associated sxs/prior Treatment) The history is provided by the patient.    Patient with hx asthma presents with wheezing, SOB, chest tightness gradually worsening over 6 weeks since she moved into a new apartment.  She thought the exacerbation might be due to insects so they had an exterminator come in 3 weeks ago, this did not help her symptoms.  Symptoms are worse with being in the apartment and better when she leaves. She takes albuterol at home and has gone through 2 inhalers in the past 6 weeks.   She does not take any other medications for control.  She has been hospitalized in the past for asthma but never intubated.  Denies fevers, leg swelling, recent travel, hx blood clots.    Past Medical History  Diagnosis Date  . Bipolar 1 disorder, depressed, mild   . Migraines   . GERD (gastroesophageal reflux disease)   . Bilateral ovarian cysts   . Asthma   . Pregnancy induced hypertension   . HTN (hypertension)   . Anemia    Past Surgical History  Procedure Laterality Date  . Cesarean section    . Induced abortion    . Tubal ligation     No family history on file. History  Substance Use Topics  . Smoking status: Former Smoker -- 0.50 packs/day    Types: Cigarettes    Quit date: 04/09/2014  . Smokeless tobacco: Never Used  . Alcohol Use: No     Comment: every other week   OB History   Grav Para Term Preterm Abortions TAB SAB Ect Mult Living   3 2 1 1 1 1    2      Review of Systems  Constitutional: Negative for fever.  HENT: Negative for congestion, sore throat and trouble swallowing.   Respiratory: Positive for cough, chest tightness and shortness of breath.   Cardiovascular: Negative for leg swelling.   Gastrointestinal: Negative for nausea, vomiting, abdominal pain and diarrhea.  All other systems reviewed and are negative.     Allergies  Prednisone  Home Medications   Prior to Admission medications   Medication Sig Start Date End Date Taking? Authorizing Provider  albuterol (PROVENTIL HFA;VENTOLIN HFA) 108 (90 BASE) MCG/ACT inhaler Inhale 2 puffs into the lungs every 6 (six) hours as needed for wheezing or shortness of breath.    Historical Provider, MD  benzonatate (TESSALON) 100 MG capsule Take 1 capsule (100 mg total) by mouth every 8 (eight) hours. 04/07/14   Julianne Rice, MD  hydrochlorothiazide (HYDRODIURIL) 25 MG tablet Take 25 mg by mouth daily.    Historical Provider, MD  ibuprofen (ADVIL,MOTRIN) 600 MG tablet Take 1 tablet (600 mg total) by mouth every 6 (six) hours as needed. 04/07/14   Julianne Rice, MD  methocarbamol (ROBAXIN) 500 MG tablet Take 1 tablet (500 mg total) by mouth 2 (two) times daily. 04/07/14   Julianne Rice, MD  traMADol (ULTRAM) 50 MG tablet Take 1 tablet (50 mg total) by mouth every 6 (six) hours as needed. 04/07/14   Julianne Rice, MD   BP 144/109  Pulse 111  Resp 26  SpO2 97%  LMP 04/01/2014 Physical Exam  Nursing note and vitals reviewed. Constitutional: She appears well-developed and well-nourished. No distress.  HENT:  Head: Normocephalic and atraumatic.  Neck: Neck supple.  Cardiovascular: Normal rate and regular rhythm.   Pulmonary/Chest: Tachypnea noted. She has decreased breath sounds. She has wheezes. She has no rales.  Abdominal: Soft. She exhibits no distension. There is no tenderness. There is no rebound and no guarding.  Musculoskeletal: She exhibits no edema.  Neurological: She is alert.  Skin: She is not diaphoretic.    ED Course  Procedures (including critical care time) Labs Review Labs Reviewed - No data to display  Imaging Review No results found.   EKG Interpretation None      12:20 PM Patient reports  great improvement.  Lungs CTAB, no increased work of breathing, moving air well in all fields. No rales, no ronchi, no wheezing or stridor.  O2 99% on room air.   Pt reports she no longer has a PCP, is out of her asthma medications.    MDM   Final diagnoses:  Asthma attack    Pt with asthma attack that improved dramatically after one neb treatment.  Pt feeling much better.  Given IV decadron as patient states she cannot take prednisone.  D/C home with albuterol hfa and nebs.  PCP resources given by case management.  Pt advised she must deal with the source that seems to be causing her asthma to flare up - there may be mold/mildew in her apartment, this has caused flare ups for her before.  Discussed result, findings, treatment, and follow up  with patient.  Pt given return precautions.  Pt verbalizes understanding and agrees with plan.      I doubt any other EMC precluding discharge at this time including, but not necessarily limited to the following: pneumonia, pulmonary embolism.    Clayton Bibles, PA-C 04/23/14 1640

## 2014-04-23 NOTE — ED Notes (Signed)
Bed: RESA Expected date:  Expected time:  Means of arrival:  Comments: 

## 2014-04-23 NOTE — ED Notes (Signed)
Bed: WA18 Expected date:  Expected time:  Means of arrival:  Comments: ResA 

## 2014-04-24 ENCOUNTER — Ambulatory Visit: Payer: Self-pay | Attending: Internal Medicine | Admitting: Internal Medicine

## 2014-04-24 VITALS — BP 140/80 | HR 82 | Temp 98.6°F | Resp 16 | Ht 60.0 in | Wt 162.0 lb

## 2014-04-24 DIAGNOSIS — I10 Essential (primary) hypertension: Secondary | ICD-10-CM | POA: Insufficient documentation

## 2014-04-24 DIAGNOSIS — J45901 Unspecified asthma with (acute) exacerbation: Secondary | ICD-10-CM | POA: Insufficient documentation

## 2014-04-24 DIAGNOSIS — D649 Anemia, unspecified: Secondary | ICD-10-CM | POA: Insufficient documentation

## 2014-04-24 DIAGNOSIS — F17200 Nicotine dependence, unspecified, uncomplicated: Secondary | ICD-10-CM

## 2014-04-24 DIAGNOSIS — Z87891 Personal history of nicotine dependence: Secondary | ICD-10-CM | POA: Insufficient documentation

## 2014-04-24 DIAGNOSIS — Z720 Tobacco use: Secondary | ICD-10-CM

## 2014-04-24 DIAGNOSIS — G43909 Migraine, unspecified, not intractable, without status migrainosus: Secondary | ICD-10-CM | POA: Insufficient documentation

## 2014-04-24 DIAGNOSIS — F3131 Bipolar disorder, current episode depressed, mild: Secondary | ICD-10-CM | POA: Insufficient documentation

## 2014-04-24 DIAGNOSIS — K219 Gastro-esophageal reflux disease without esophagitis: Secondary | ICD-10-CM | POA: Insufficient documentation

## 2014-04-24 MED ORDER — HYDROCHLOROTHIAZIDE 25 MG PO TABS: 25.0000 mg | ORAL_TABLET | Freq: Every day | ORAL | Status: DC

## 2014-04-24 NOTE — Patient Instructions (Addendum)
Asthma Attack Prevention Although there is no way to prevent asthma from starting, you can take steps to control the disease and reduce its symptoms. Learn about your asthma and how to control it. Take an active role to control your asthma by working with your health care provider to create and follow an asthma action plan. An asthma action plan guides you in:  Taking your medicines properly.  Avoiding things that set off your asthma or make your asthma worse (asthma triggers).  Tracking your level of asthma control.  Responding to worsening asthma.  Seeking emergency care when needed. To track your asthma, keep records of your symptoms, check your peak flow number using a handheld device that shows how well air moves out of your lungs (peak flow meter), and get regular asthma checkups.  WHAT ARE SOME WAYS TO PREVENT AN ASTHMA ATTACK?  Take medicines as directed by your health care provider.  Keep track of your asthma symptoms and level of control.  With your health care provider, write a detailed plan for taking medicines and managing an asthma attack. Then be sure to follow your action plan. Asthma is an ongoing condition that needs regular monitoring and treatment.  Identify and avoid asthma triggers. Many outdoor allergens and irritants (such as pollen, mold, cold air, and air pollution) can trigger asthma attacks. Find out what your asthma triggers are and take steps to avoid them.  Monitor your breathing. Learn to recognize warning signs of an attack, such as coughing, wheezing, or shortness of breath. Your lung function may decrease before you notice any signs or symptoms, so regularly measure and record your peak airflow with a home peak flow meter.  Identify and treat attacks early. If you act quickly, you are less likely to have a severe attack. You will also need less medicine to control your symptoms. When your peak flow measurements decrease and alert you to an upcoming attack,  take your medicine as instructed and immediately stop any activity that may have triggered the attack. If your symptoms do not improve, get medical help.  Pay attention to increasing quick-relief inhaler use. If you find yourself relying on your quick-relief inhaler, your asthma is not under control. See your health care provider about adjusting your treatment. WHAT CAN MAKE MY SYMPTOMS WORSE? A number of common things can set off or make your asthma symptoms worse and cause temporary increased inflammation of your airways. Keep track of your asthma symptoms for several weeks, detailing all the environmental and emotional factors that are linked with your asthma. When you have an asthma attack, go back to your asthma diary to see which factor, or combination of factors, might have contributed to it. Once you know what these factors are, you can take steps to control many of them. If you have allergies and asthma, it is important to take asthma prevention steps at home. Minimizing contact with the substance to which you are allergic will help prevent an asthma attack. Some triggers and ways to avoid these triggers are: Animal Dander:  Some people are allergic to the flakes of skin or dried saliva from animals with fur or feathers.   There is no such thing as a hypoallergenic dog or cat breed. All dogs or cats can cause allergies, even if they don't shed.  Keep these pets out of your home.  If you are not able to keep a pet outdoors, keep the pet out of your bedroom and other sleeping areas at all   times, and keep the door closed.  Remove carpets and furniture covered with cloth from your home. If that is not possible, keep the pet away from fabric-covered furniture and carpets. Dust Mites: Many people with asthma are allergic to dust mites. Dust mites are tiny bugs that are found in every home in mattresses, pillows, carpets, fabric-covered furniture, bedcovers, clothes, stuffed toys, and other  fabric-covered items.   Cover your mattress in a special dust-proof cover.  Cover your pillow in a special dust-proof cover, or wash the pillow each week in hot water. Water must be hotter than 130 F (54.4 C) to kill dust mites. Cold or warm water used with detergent and bleach can also be effective.  Wash the sheets and blankets on your bed each week in hot water.  Try not to sleep or lie on cloth-covered cushions.  Call ahead when traveling and ask for a smoke-free hotel room. Bring your own bedding and pillows in case the hotel only supplies feather pillows and down comforters, which may contain dust mites and cause asthma symptoms.  Remove carpets from your bedroom and those laid on concrete, if you can.  Keep stuffed toys out of the bed, or wash the toys weekly in hot water or cooler water with detergent and bleach. Cockroaches: Many people with asthma are allergic to the droppings and remains of cockroaches.   Keep food and garbage in closed containers. Never leave food out.  Use poison baits, traps, powders, gels, or paste (for example, boric acid).  If a spray is used to kill cockroaches, stay out of the room until the odor goes away. Indoor Mold:  Fix leaky faucets, pipes, or other sources of water that have mold around them.  Clean floors and moldy surfaces with a fungicide or diluted bleach.  Avoid using humidifiers, vaporizers, or swamp coolers. These can spread molds through the air. Pollen and Outdoor Mold:  When pollen or mold spore counts are high, try to keep your windows closed.  Stay indoors with windows closed from late morning to afternoon. Pollen and some mold spore counts are highest at that time.  Ask your health care provider whether you need to take anti-inflammatory medicine or increase your dose of the medicine before your allergy season starts. Other Irritants to Avoid:  Tobacco smoke is an irritant. If you smoke, ask your health care provider how  you can quit. Ask family members to quit smoking too. Do not allow smoking in your home or car.  If possible, do not use a wood-burning stove, kerosene heater, or fireplace. Minimize exposure to all sources of smoke, including to incense, candles, fires, and fireworks.  Try to stay away from strong odors and sprays, such as perfume, talcum powder, hair spray, and paints.  Decrease humidity in your home and use an indoor air cleaning device. Reduce indoor humidity to below 60%. Dehumidifiers or central air conditioners can do this.  Decrease house dust exposure by changing furnace and air cooler filters frequently.  Try to have someone else vacuum for you once or twice a week. Stay out of rooms while they are being vacuumed and for a short while afterward.  If you vacuum, use a dust mask from a hardware store, a double-layered or microfilter vacuum cleaner bag, or a vacuum cleaner with a HEPA filter.  Sulfites in foods and beverages can be irritants. Do not drink beer or wine or eat dried fruit, processed potatoes, or shrimp if they cause asthma symptoms.    Cold air can trigger an asthma attack. Cover your nose and mouth with a scarf on cold or windy days.  Several health conditions can make asthma more difficult to manage, including a runny nose, sinus infections, reflux disease, psychological stress, and sleep apnea. Work with your health care provider to manage these conditions.  Avoid close contact with people who have a respiratory infection such as a cold or the flu, since your asthma symptoms may get worse if you catch the infection. Wash your hands thoroughly after touching items that may have been handled by people with a respiratory infection.  Get a flu shot every year to protect against the flu virus, which often makes asthma worse for days or weeks. Also get a pneumonia shot if you have not previously had one. Unlike the flu shot, the pneumonia shot does not need to be given  yearly. Medicines:  Talk to your health care provider about whether it is safe for you to take aspirin or non-steroidal anti-inflammatory medicines (NSAIDs). In a small number of people with asthma, aspirin and NSAIDs can cause asthma attacks. These medicines must be avoided by people who have known aspirin-sensitive asthma. It is important that people with aspirin-sensitive asthma read labels of all over-the-counter medicines used to treat pain, colds, coughs, and fever.  Beta blockers and ACE inhibitors are other medicines you should discuss with your health care provider. HOW CAN I FIND OUT WHAT I AM ALLERGIC TO? Ask your asthma health care provider about allergy skin testing or blood testing (the RAST test) to identify the allergens to which you are sensitive. If you are found to have allergies, the most important thing to do is to try to avoid exposure to any allergens that you are sensitive to as much as possible. Other treatments for allergies, such as medicines and allergy shots (immunotherapy) are available.  CAN I EXERCISE? Follow your health care provider's advice regarding asthma treatment before exercising. It is important to maintain a regular exercise program, but vigorous exercise, or exercise in cold, humid, or dry environments can cause asthma attacks, especially for those people who have exercise-induced asthma. Document Released: 11/23/2009 Document Revised: 08/07/2013 Document Reviewed: 06/12/2013 [facility] Patient Information 2014 [place]. Hypertension As your heart beats, it forces blood through your arteries. This force is your blood pressure. If the pressure is too high, it is called hypertension (HTN) or high blood pressure. HTN is dangerous because you may have it and not know it. High blood pressure may mean that your heart has to work harder to pump blood. Your arteries may be narrow or stiff. The extra work puts you at risk for heart disease, stroke, and other  problems.  Blood pressure consists of two numbers, a higher number over a lower, 110/72, for example. It is stated as "110 over 72." The ideal is below 120 for the top number (systolic) and under 80 for the bottom (diastolic). Write down your blood pressure today. You should pay close attention to your blood pressure if you have certain conditions such as:  Heart failure.  Prior heart attack.  Diabetes  Chronic kidney disease.  Prior stroke.  Multiple risk factors for heart disease. To see if you have HTN, your blood pressure should be measured while you are seated with your arm held at the level of the heart. It should be measured at least twice. A one-time elevated blood pressure reading (especially in the Emergency Department) does not mean that you need treatment. There may  be conditions in which the blood pressure is different between your right and left arms. It is important to see your caregiver soon for a recheck. Most people have essential hypertension which means that there is not a specific cause. This type of high blood pressure may be lowered by changing lifestyle factors such as:  Stress.  Smoking.  Lack of exercise.  Excessive weight.  Drug/tobacco/alcohol use.  Eating less salt. Most people do not have symptoms from high blood pressure until it has caused damage to the body. Effective treatment can often prevent, delay or reduce that damage. TREATMENT  When a cause has been identified, treatment for high blood pressure is directed at the cause. There are a large number of medications to treat HTN. These fall into several categories, and your caregiver will help you select the medicines that are best for you. Medications may have side effects. You should review side effects with your caregiver. If your blood pressure stays high after you have made lifestyle changes or started on medicines,   Your medication(s) may need to be changed.  Other problems may need to be  addressed.  Be certain you understand your prescriptions, and know how and when to take your medicine.  Be sure to follow up with your caregiver within the time frame advised (usually within two weeks) to have your blood pressure rechecked and to review your medications.  If you are taking more than one medicine to lower your blood pressure, make sure you know how and at what times they should be taken. Taking two medicines at the same time can result in blood pressure that is too low. SEEK IMMEDIATE MEDICAL CARE IF:  You develop a severe headache, blurred or changing vision, or confusion.  You have unusual weakness or numbness, or a faint feeling.  You have severe chest or abdominal pain, vomiting, or breathing problems. MAKE SURE YOU:   Understand these instructions.  Will watch your condition.  Will get help right away if you are not doing well or get worse. Document Released: 12/05/2005 Document Revised: 02/27/2012 Document Reviewed: 07/25/2008 [facility] Patient Information 2014 [place]. Asthma Attack Prevention Although there is no way to prevent asthma from starting, you can take steps to control the disease and reduce its symptoms. Learn about your asthma and how to control it. Take an active role to control your asthma by working with your health care provider to create and follow an asthma action plan. An asthma action plan guides you in:  Taking your medicines properly.  Avoiding things that set off your asthma or make your asthma worse (asthma triggers).  Tracking your level of asthma control.  Responding to worsening asthma.  Seeking emergency care when needed. To track your asthma, keep records of your symptoms, check your peak flow number using a handheld device that shows how well air moves out of your lungs (peak flow meter), and get regular asthma checkups.  WHAT ARE SOME WAYS TO PREVENT AN ASTHMA ATTACK?  Take medicines as directed by your health care  provider.  Keep track of your asthma symptoms and level of control.  With your health care provider, write a detailed plan for taking medicines and managing an asthma attack. Then be sure to follow your action plan. Asthma is an ongoing condition that needs regular monitoring and treatment.  Identify and avoid asthma triggers. Many outdoor allergens and irritants (such as pollen, mold, cold air, and air pollution) can trigger asthma attacks. Find out what  your asthma triggers are and take steps to avoid them.  Monitor your breathing. Learn to recognize warning signs of an attack, such as coughing, wheezing, or shortness of breath. Your lung function may decrease before you notice any signs or symptoms, so regularly measure and record your peak airflow with a home peak flow meter.  Identify and treat attacks early. If you act quickly, you are less likely to have a severe attack. You will also need less medicine to control your symptoms. When your peak flow measurements decrease and alert you to an upcoming attack, take your medicine as instructed and immediately stop any activity that may have triggered the attack. If your symptoms do not improve, get medical help.  Pay attention to increasing quick-relief inhaler use. If you find yourself relying on your quick-relief inhaler, your asthma is not under control. See your health care provider about adjusting your treatment. WHAT CAN MAKE MY SYMPTOMS WORSE? A number of common things can set off or make your asthma symptoms worse and cause temporary increased inflammation of your airways. Keep track of your asthma symptoms for several weeks, detailing all the environmental and emotional factors that are linked with your asthma. When you have an asthma attack, go back to your asthma diary to see which factor, or combination of factors, might have contributed to it. Once you know what these factors are, you can take steps to control many of them. If you have  allergies and asthma, it is important to take asthma prevention steps at home. Minimizing contact with the substance to which you are allergic will help prevent an asthma attack. Some triggers and ways to avoid these triggers are: Animal Dander:  Some people are allergic to the flakes of skin or dried saliva from animals with fur or feathers.   There is no such thing as a hypoallergenic dog or cat breed. All dogs or cats can cause allergies, even if they don't shed.  Keep these pets out of your home.  If you are not able to keep a pet outdoors, keep the pet out of your bedroom and other sleeping areas at all times, and keep the door closed.  Remove carpets and furniture covered with cloth from your home. If that is not possible, keep the pet away from fabric-covered furniture and carpets. Dust Mites: Many people with asthma are allergic to dust mites. Dust mites are tiny bugs that are found in every home in mattresses, pillows, carpets, fabric-covered furniture, bedcovers, clothes, stuffed toys, and other fabric-covered items.   Cover your mattress in a special dust-proof cover.  Cover your pillow in a special dust-proof cover, or wash the pillow each week in hot water. Water must be hotter than 130 F (54.4 C) to kill dust mites. Cold or warm water used with detergent and bleach can also be effective.  Wash the sheets and blankets on your bed each week in hot water.  Try not to sleep or lie on cloth-covered cushions.  Call ahead when traveling and ask for a smoke-free hotel room. Bring your own bedding and pillows in case the hotel only supplies feather pillows and down comforters, which may contain dust mites and cause asthma symptoms.  Remove carpets from your bedroom and those laid on concrete, if you can.  Keep stuffed toys out of the bed, or wash the toys weekly in hot water or cooler water with detergent and bleach. Cockroaches: Many people with asthma are allergic to the  droppings and remains  of cockroaches.   Keep food and garbage in closed containers. Never leave food out.  Use poison baits, traps, powders, gels, or paste (for example, boric acid).  If a spray is used to kill cockroaches, stay out of the room until the odor goes away. Indoor Mold:  Fix leaky faucets, pipes, or other sources of water that have mold around them.  Clean floors and moldy surfaces with a fungicide or diluted bleach.  Avoid using humidifiers, vaporizers, or swamp coolers. These can spread molds through the air. Pollen and Outdoor Mold:  When pollen or mold spore counts are high, try to keep your windows closed.  Stay indoors with windows closed from late morning to afternoon. Pollen and some mold spore counts are highest at that time.  Ask your health care provider whether you need to take anti-inflammatory medicine or increase your dose of the medicine before your allergy season starts. Other Irritants to Avoid:  Tobacco smoke is an irritant. If you smoke, ask your health care provider how you can quit. Ask family members to quit smoking too. Do not allow smoking in your home or car.  If possible, do not use a wood-burning stove, kerosene heater, or fireplace. Minimize exposure to all sources of smoke, including to incense, candles, fires, and fireworks.  Try to stay away from strong odors and sprays, such as perfume, talcum powder, hair spray, and paints.  Decrease humidity in your home and use an indoor air cleaning device. Reduce indoor humidity to below 60%. Dehumidifiers or central air conditioners can do this.  Decrease house dust exposure by changing furnace and air cooler filters frequently.  Try to have someone else vacuum for you once or twice a week. Stay out of rooms while they are being vacuumed and for a short while afterward.  If you vacuum, use a dust mask from a hardware store, a double-layered or microfilter vacuum cleaner bag, or a vacuum cleaner  with a HEPA filter.  Sulfites in foods and beverages can be irritants. Do not drink beer or wine or eat dried fruit, processed potatoes, or shrimp if they cause asthma symptoms.  Cold air can trigger an asthma attack. Cover your nose and mouth with a scarf on cold or windy days.  Several health conditions can make asthma more difficult to manage, including a runny nose, sinus infections, reflux disease, psychological stress, and sleep apnea. Work with your health care provider to manage these conditions.  Avoid close contact with people who have a respiratory infection such as a cold or the flu, since your asthma symptoms may get worse if you catch the infection. Wash your hands thoroughly after touching items that may have been handled by people with a respiratory infection.  Get a flu shot every year to protect against the flu virus, which often makes asthma worse for days or weeks. Also get a pneumonia shot if you have not previously had one. Unlike the flu shot, the pneumonia shot does not need to be given yearly. Medicines:  Talk to your health care provider about whether it is safe for you to take aspirin or non-steroidal anti-inflammatory medicines (NSAIDs). In a small number of people with asthma, aspirin and NSAIDs can cause asthma attacks. These medicines must be avoided by people who have known aspirin-sensitive asthma. It is important that people with aspirin-sensitive asthma read labels of all over-the-counter medicines used to treat pain, colds, coughs, and fever.  Beta blockers and ACE inhibitors are other medicines you should  discuss with your health care provider. HOW CAN I FIND OUT WHAT I AM ALLERGIC TO? Ask your asthma health care provider about allergy skin testing or blood testing (the RAST test) to identify the allergens to which you are sensitive. If you are found to have allergies, the most important thing to do is to try to avoid exposure to any allergens that you are  sensitive to as much as possible. Other treatments for allergies, such as medicines and allergy shots (immunotherapy) are available.  CAN I EXERCISE? Follow your health care provider's advice regarding asthma treatment before exercising. It is important to maintain a regular exercise program, but vigorous exercise, or exercise in cold, humid, or dry environments can cause asthma attacks, especially for those people who have exercise-induced asthma. Document Released: 11/23/2009 Document Revised: 08/07/2013 Document Reviewed: 06/12/2013 [facility] Patient Information 2014 [place].

## 2014-04-24 NOTE — Progress Notes (Signed)
Patient ID: [name], female   DOB: 1976-11-14, 38 y.o.   MRN: [id number]  CC: Asthma exacerbation  HPI: 38 year old female with past medical history of asthma, hypertension, bipolar disorder and depression who presented to clinic because she ran out of blood pressure medications. Patient reports using occasionally albuterol inhaler but did not use it for some time. Her asthma is under control. No shortness of breath or chest tightness.  Allergies  Allergen Reactions  . Prednisone Swelling and Rash    Leg swells up; pt reports I.V use with no problems   Past Medical History  Diagnosis Date  . Bipolar 1 disorder, depressed, mild   . Migraines   . GERD (gastroesophageal reflux disease)   . Bilateral ovarian cysts   . Asthma   . Pregnancy induced hypertension   . HTN (hypertension)   . Anemia    Current Outpatient Prescriptions on File Prior to Visit  Medication Sig Dispense Refill  . albuterol (PROVENTIL HFA;VENTOLIN HFA) 108 (90 BASE) MCG/ACT inhaler Inhale 2 puffs into the lungs every 6 (six) hours as needed for wheezing or shortness of breath.      Marland Kitchen albuterol (PROVENTIL) (2.5 MG/3ML) 0.083% nebulizer solution Take 3 mLs (2.5 mg total) by nebulization every 4 (four) hours as needed for wheezing or shortness of breath.  30 vial  0  . fluticasone (FLOVENT HFA) 44 MCG/ACT inhaler Inhale 1 puff into the lungs 2 (two) times daily.  1 Inhaler  12  . traMADol (ULTRAM) 50 MG tablet Take 1 tablet (50 mg total) by mouth every 6 (six) hours as needed.  15 tablet  0  . albuterol (PROVENTIL) (2.5 MG/3ML) 0.083% nebulizer solution Take 2.5 mg by nebulization every 6 (six) hours as needed for wheezing or shortness of breath.      . benzonatate (TESSALON) 100 MG capsule Take 1 capsule (100 mg total) by mouth every 8 (eight) hours.  21 capsule  0  . methocarbamol (ROBAXIN) 500 MG tablet Take 1 tablet (500 mg total) by mouth 2 (two) times daily.  20 tablet  0   No current facility-administered  medications on file prior to visit.   Family medical history significant for HTN, HLD  History   Social History  . Marital Status: Single    Spouse Name: N/A    Number of Children: N/A  . Years of Education: N/A   Occupational History  . Not on file.   Social History Main Topics  . Smoking status: Former Smoker -- 0.50 packs/day    Types: Cigarettes    Quit date: 04/09/2014  . Smokeless tobacco: Never Used  . Alcohol Use: No     Comment: every other week  . Drug Use: No  . Sexual Activity: Yes    Birth Control/ Protection: Surgical   Other Topics Concern  . Not on file   Social History Narrative  . No narrative on file    Review of Systems  Constitutional: Negative for fever, chills, diaphoresis, activity change, appetite change and fatigue.  HENT: Negative for ear pain, nosebleeds, congestion, facial swelling, rhinorrhea, neck pain, neck stiffness and ear discharge.   Eyes: Negative for pain, discharge, redness, itching and visual disturbance.  Respiratory: Negative for cough, choking, chest tightness, shortness of breath, wheezing and stridor.   Cardiovascular: Negative for chest pain, palpitations and leg swelling.  Gastrointestinal: Negative for abdominal distention.  Genitourinary: Negative for dysuria, urgency, frequency, hematuria, flank pain, decreased urine volume, difficulty urinating and dyspareunia.  Musculoskeletal:  Negative for back pain, joint swelling, arthralgias and gait problem.  Neurological: Negative for dizziness, tremors, seizures, syncope, facial asymmetry, speech difficulty, weakness, light-headedness, numbness and headaches.  Hematological: Negative for adenopathy. Does not bruise/bleed easily.  Psychiatric/Behavioral: Negative for hallucinations, behavioral problems, confusion, dysphoric mood, decreased concentration and agitation.    Objective:   Filed Vitals:   04/24/14 1654  BP: 140/80  Pulse: 82  Temp: 98.6 F (37 C)  Resp: 16     Physical Exam  Constitutional: Appears well-developed and well-nourished. No distress.  HENT: Normocephalic. External right and left ear normal. Oropharynx is clear and moist.  Eyes: Conjunctivae and EOM are normal. PERRLA, no scleral icterus.  Neck: Normal ROM. Neck supple. No JVD. No tracheal deviation. No thyromegaly.  CVS: RRR, S1/S2 +, no murmurs, no gallops, no carotid bruit.  Pulmonary: Effort and breath sounds normal, no stridor, rhonchi, wheezes, rales.  Abdominal: Soft. BS +,  no distension, tenderness, rebound or guarding.  Musculoskeletal: Normal range of motion. No edema and no tenderness.  Lymphadenopathy: No lymphadenopathy noted, cervical, inguinal. Neuro: Alert. Normal reflexes, muscle tone coordination. No cranial nerve deficit. Skin: Skin is warm and dry. No rash noted. Not diaphoretic. No erythema. No pallor.  Psychiatric: Normal mood and affect. Behavior, judgment, thought content normal.   Lab Results  Component Value Date   WBC 8.9 04/07/2014   HGB 12.0 04/07/2014   HCT 35.4* 04/07/2014   MCV 91.2 04/07/2014   PLT 394 04/07/2014   Lab Results  Component Value Date   CREATININE 0.85 04/07/2014   BUN 7 04/07/2014   NA 142 04/07/2014   K 3.6* 04/07/2014   CL 104 04/07/2014   CO2 23 04/07/2014    No results found for this basename: HGBA1C   Lipid Panel  No results found for this basename: chol, trig, hdl, cholhdl, vldl, ldlcalc       Assessment and plan:   Patient Active Problem List   Diagnosis Date Noted  . HTN (hypertension) 09/02/2013    Priority: Medium - pt ran out medications; prescription provided - We have discussed target BP range - I have advised pt to check BP regularly and to call us back if the numbers are higher than 140/90 - discussed the importance of compliance with medical therapy and diet   . Asthma exacerbation 09/01/2013    Priority: Medium - pt has enough of albuterol inhaler and nebulizer; she was instructed to call back if  refills on those are needed  . Tobacco abuse - counseled  09/01/2013

## 2014-04-24 NOTE — Progress Notes (Signed)
Pt here for f/u Asthma exacerbation 04/23/14 Pt is taking prescribed Albuterol Inhaler as needed and neb treatments Need medication refill HCTZ. Ran out 3 dys ago Denies sob or chest pain today

## 2014-04-28 ENCOUNTER — Encounter: Payer: Self-pay | Admitting: Internal Medicine

## 2014-05-06 NOTE — ED Provider Notes (Signed)
Medical screening examination/treatment/procedure(s) were performed by non-physician practitioner and as supervising physician I was immediately available for consultation/collaboration.   EKG Interpretation   Date/Time:  Wednesday Apr 23 2014 10:44:31 EDT Ventricular Rate:  99 PR Interval:  159 QRS Duration: 93 QT Interval:  337 QTC Calculation: 432 R Axis:   75 Text Interpretation:  Sinus rhythm RSR' in V1 or V2, right VCD or RVH ED  PHYSICIAN INTERPRETATION AVAILABLE IN [facility] Confirmed by TEST,  Record ([id number]) on 04/25/2014 9:15:41 AM      Rolland Porter, MD, [facility] S[facility]   Janice Norrie, MD 05/06/14 [phone]

## 2014-05-14 ENCOUNTER — Ambulatory Visit: Payer: Self-pay

## 2014-06-02 ENCOUNTER — Emergency Department (HOSPITAL_COMMUNITY): Payer: No Typology Code available for payment source

## 2014-06-02 ENCOUNTER — Encounter (HOSPITAL_COMMUNITY): Payer: Self-pay | Admitting: Emergency Medicine

## 2014-06-02 ENCOUNTER — Emergency Department (HOSPITAL_COMMUNITY)
Admission: EM | Admit: 2014-06-02 | Discharge: 2014-06-02 | Disposition: A | Discharge status: Home / Self Care | Payer: No Typology Code available for payment source | Attending: Emergency Medicine | Admitting: Emergency Medicine

## 2014-06-02 DIAGNOSIS — Z862 Personal history of diseases of the blood and blood-forming organs and certain disorders involving the immune mechanism: Secondary | ICD-10-CM | POA: Insufficient documentation

## 2014-06-02 DIAGNOSIS — J45909 Unspecified asthma, uncomplicated: Secondary | ICD-10-CM | POA: Diagnosis not present

## 2014-06-02 DIAGNOSIS — Y929 Unspecified place or not applicable: Secondary | ICD-10-CM | POA: Insufficient documentation

## 2014-06-02 DIAGNOSIS — Z8659 Personal history of other mental and behavioral disorders: Secondary | ICD-10-CM | POA: Diagnosis not present

## 2014-06-02 DIAGNOSIS — W010XXA Fall on same level from slipping, tripping and stumbling without subsequent striking against object, initial encounter: Secondary | ICD-10-CM | POA: Diagnosis not present

## 2014-06-02 DIAGNOSIS — IMO0002 Reserved for concepts with insufficient information to code with codable children: Secondary | ICD-10-CM | POA: Insufficient documentation

## 2014-06-02 DIAGNOSIS — Z791 Long term (current) use of non-steroidal anti-inflammatories (NSAID): Secondary | ICD-10-CM | POA: Diagnosis not present

## 2014-06-02 DIAGNOSIS — Y9389 Activity, other specified: Secondary | ICD-10-CM | POA: Insufficient documentation

## 2014-06-02 DIAGNOSIS — M461 Sacroiliitis, not elsewhere classified: Secondary | ICD-10-CM | POA: Insufficient documentation

## 2014-06-02 DIAGNOSIS — M76899 Other specified enthesopathies of unspecified lower limb, excluding foot: Secondary | ICD-10-CM | POA: Diagnosis not present

## 2014-06-02 DIAGNOSIS — I10 Essential (primary) hypertension: Secondary | ICD-10-CM | POA: Diagnosis not present

## 2014-06-02 DIAGNOSIS — Z87891 Personal history of nicotine dependence: Secondary | ICD-10-CM | POA: Insufficient documentation

## 2014-06-02 DIAGNOSIS — S79919A Unspecified injury of unspecified hip, initial encounter: Secondary | ICD-10-CM | POA: Insufficient documentation

## 2014-06-02 DIAGNOSIS — Z8719 Personal history of other diseases of the digestive system: Secondary | ICD-10-CM | POA: Insufficient documentation

## 2014-06-02 DIAGNOSIS — Z79899 Other long term (current) drug therapy: Secondary | ICD-10-CM | POA: Insufficient documentation

## 2014-06-02 DIAGNOSIS — S79929A Unspecified injury of unspecified thigh, initial encounter: Secondary | ICD-10-CM | POA: Diagnosis not present

## 2014-06-02 DIAGNOSIS — M7060 Trochanteric bursitis, unspecified hip: Secondary | ICD-10-CM

## 2014-06-02 DIAGNOSIS — Z8742 Personal history of other diseases of the female genital tract: Secondary | ICD-10-CM | POA: Diagnosis not present

## 2014-06-02 DIAGNOSIS — X500XXA Overexertion from strenuous movement or load, initial encounter: Secondary | ICD-10-CM | POA: Insufficient documentation

## 2014-06-02 MED ORDER — IBUPROFEN 800 MG PO TABS: 800.0000 mg | ORAL_TABLET | Freq: Three times a day (TID) | ORAL | Status: DC

## 2014-06-02 MED ORDER — OXYCODONE-ACETAMINOPHEN 5-325 MG PO TABS
1.0000 | ORAL_TABLET | Freq: Once | ORAL | Status: AC
  Administered 2014-06-02: 1 via ORAL
  Filled 2014-06-02: qty 1

## 2014-06-02 MED ORDER — METHOCARBAMOL 500 MG PO TABS: 500.0000 mg | ORAL_TABLET | Freq: Two times a day (BID) | ORAL | Status: DC

## 2014-06-02 NOTE — ED Provider Notes (Signed)
CSN: [id number]     Arrival date & time 06/02/14  1648 History  This chart was scribed for non-physician practitioner, Rachel Butts, PA-C working with Rachel Blade, MD by [name], ED scribe. This patient was seen in room TR04C/TR04C and the patient's care was started at [name] PM.   Chief Complaint  Patient presents with  . Fall   The history is provided by the patient. No language interpreter was used.   HPI Comments: [name] is a 38 y.o. female who presents to the Emergency Department complaining of a fall that occurred last night. Pt slipped on oil, fell and did a split. States she heard a pop in her hip. She didn't feel any pain until the middle of the night. States it was sudden and sharp. She also reports right ankle pain which is mild and does not prevent ambulation. Pt has taken tylenol with no relief. States she has soaked in hot water with some relief. Bearing weight and certain movements worsen the pain in the hip. Denies bowel or bladder incontinence, numbness or tingling.   Past Medical History  Diagnosis Date  . Bipolar 1 disorder, depressed, mild   . Migraines   . GERD (gastroesophageal reflux disease)   . Bilateral ovarian cysts   . Asthma   . Pregnancy induced hypertension   . HTN (hypertension)   . Anemia    Past Surgical History  Procedure Laterality Date  . Cesarean section    . Induced abortion    . Tubal ligation     History reviewed. No pertinent family history. History  Substance Use Topics  . Smoking status: Former Smoker -- 0.50 packs/day    Types: Cigarettes    Quit date: 04/09/2014  . Smokeless tobacco: Never Used  . Alcohol Use: No     Comment: every other week   OB History   Grav Para Term Preterm Abortions TAB SAB Ect Mult Living   3 2 1 1 1 1    2      Review of Systems  Genitourinary:       Negative for bowel or bladder incontinence.  Musculoskeletal: Positive for arthralgias.  Neurological: Negative for numbness.   All other systems reviewed and are negative.  Allergies  Prednisone  Home Medications   Prior to Admission medications   Medication Sig Start Date End Date Taking? Authorizing Provider  albuterol (PROVENTIL HFA;VENTOLIN HFA) 108 (90 BASE) MCG/ACT inhaler Inhale 2 puffs into the lungs every 6 (six) hours as needed for wheezing or shortness of breath.   Yes Historical Provider, MD  albuterol (PROVENTIL) (2.5 MG/3ML) 0.083% nebulizer solution Take 2.5 mg by nebulization every 6 (six) hours as needed for wheezing or shortness of breath.   Yes Historical Provider, MD  albuterol (PROVENTIL) (2.5 MG/3ML) 0.083% nebulizer solution Take 3 mLs (2.5 mg total) by nebulization every 4 (four) hours as needed for wheezing or shortness of breath. 04/23/14  Yes Rachel Bibles, PA-C  fluticasone (FLOVENT HFA) 44 MCG/ACT inhaler Inhale 1 puff into the lungs 2 (two) times daily. 04/23/14  Yes Rachel Bibles, PA-C  hydrochlorothiazide (HYDRODIURIL) 25 MG tablet Take 1 tablet (25 mg total) by mouth daily. 04/24/14  Yes Rachel Lis, MD  ibuprofen (ADVIL,MOTRIN) 800 MG tablet Take 1 tablet (800 mg total) by mouth 3 (three) times daily. 06/02/14   Rachel Vester, PA-C  methocarbamol (ROBAXIN) 500 MG tablet Take 1 tablet (500 mg total) by mouth 2 (two) times daily. 06/02/14   [name]  Rachel Barthel, PA-C   BP 126/80  Pulse 97  Temp(Src) 98.2 F (36.8 C) (Oral)  Resp 18  SpO2 98%  LMP 05/26/2014  Physical Exam  Nursing note and vitals reviewed. Constitutional: She is oriented to person, place, and time. She appears well-developed and well-nourished. No distress.  HENT:  Head: Normocephalic and atraumatic.  Mouth/Throat: Oropharynx is clear and moist. No oropharyngeal exudate.  Eyes: Conjunctivae are normal.  Neck: Normal range of motion. Neck supple.  Full ROM without pain  Cardiovascular: Normal rate, regular rhythm and intact distal pulses.   Pulmonary/Chest: Effort normal and breath sounds normal. No respiratory  distress. She has no wheezes.  Abdominal: Soft. She exhibits no distension. There is no tenderness.  Musculoskeletal:  Full range of motion of the T-spine and L-spine No tenderness to palpation of the spinous processes of the T-spine or L-spine No tenderness to palpation of the paraspinous muscles of the L-spine No pain to palpation of either gracilis muscle Pain to palpation over right trochanter Pain to palpation of right SI joint No tenderness to palpation of the lateral or medial malleolus, no appreciable swelling of the right ankle, negative Thompson's test  Lymphadenopathy:    She has no cervical adenopathy.  Neurological: She is alert and oriented to person, place, and time. She has normal reflexes.  Speech is clear and goal oriented, follows commands Normal 5/5 strength in upper and lower extremities bilaterally including dorsiflexion and plantar flexion, strong and equal grip strength Sensation normal to light and sharp touch Moves extremities without ataxia, coordination intact Normal gait Normal balance   Skin: Skin is warm and dry. No rash noted. She is not diaphoretic. No erythema.  Psychiatric: She has a normal mood and affect. Her behavior is normal.    ED Course  Procedures (including critical care time)  DIAGNOSTIC STUDIES: Oxygen Saturation is 98% on RA, normal by my interpretation.    COORDINATION OF CARE: 6:46 PM-Discussed treatment plan which includes 800 mg ibuprofen, tylenol, a muscle relaxer and back exercises with pt at bedside and pt agreed to plan.   Labs Review Labs Reviewed - No data to display  Imaging Review Dg Hip Complete Right  06/02/2014   CLINICAL DATA:  Fall.  EXAM: RIGHT HIP - COMPLETE 2+ VIEW  COMPARISON:  None.  FINDINGS: There is no evidence of hip fracture or dislocation. There is no evidence of arthropathy or other focal bone abnormality. SI joints and hip joints are symmetric and unremarkable.  IMPRESSION: No acute bony abnormality.    Electronically Signed   By: [name] M.D.   On: 06/02/2014 18:20     EKG Interpretation None      MDM   Final diagnoses:  SI (sacroiliac) joint inflammation  Trochanteric bursitis   [name] presents with right hip pain after fall.  Patient with pain to palpation of the right SI joint but no neurologic deficits. Amylase without difficulty. Patient also with mild pain to palpation of the right greater trochanter.  Patient X-Ray negative for obvious fracture or dislocation. Pain managed in ED. Pt advised to follow up with primary care / orthopedics if symptoms persist for possibility of missed fracture diagnosis. Conservative therapy recommended and discussed. Patient will be dc home & is agreeable with above plan.  I have personally reviewed patient's vitals, nursing note and any pertinent labs or imaging.  I performed an undressed physical exam.    At this time, it has been determined that no acute conditions requiring  further emergency intervention. The patient/guardian have been advised of the diagnosis and plan. I reviewed all labs and imaging including any potential incidental findings. We have discussed signs and symptoms that warrant return to the ED, such as ability to walk, loss of bowel or bladder control.  Patient/guardian has voiced understanding and agreed to follow-up with the PCP or specialist in one week.  Vital signs are stable at discharge.   BP 126/80  Pulse 97  Temp(Src) 98.2 F (36.8 C) (Oral)  Resp 18  SpO2 98%  LMP 05/26/2014    I personally performed the services described in this documentation, which was scribed in my presence. The recorded information has been reviewed and is accurate.  [name] Tashiya Souders, PA-C 06/02/14 1855

## 2014-06-02 NOTE — Discharge Instructions (Signed)
1. Medications: robaxin, ibuprofen, usual home medications 2. Treatment: rest, drink plenty of fluids, ice and gentle stretching 3. Follow Up: Please followup with your primary doctor in 1 week for discussion of your diagnoses and further evaluation after today's visit;     Hip Bursitis Bursitis is a puffiness (swelling) and soreness of a fluid-filled sac (bursa). This sac covers and protects the joint. HOME CARE  Put ice on the injured area.  Put ice in a plastic bag.  Place a towel between your skin and the bag.  Leave the ice on for 15-20 minutes, 03-04 times a day.  Rest the painful joint as much as possible. Move your joint at least 4 times a day. When pain lessens, start normal, slow movements and normal activities.  Only take medicine as told by your doctor.  Use crutches as told.  Raise (elevate) your painful joint. Use pillows for propping your legs and hips.  Get a massage to lessen pain. GET HELP RIGHT AWAY IF:  Your pain increases or does not improve during treatment.  You have a fever.  You feel heat coming from the affected area.  You see redness and puffiness around the affected area.  You have any questions or concerns. MAKE SURE YOU:  Understand these instructions.  Will watch your condition.  Will get help right away if you are not well or get worse. Document Released: 01/07/2011 Document Revised: 02/27/2012 Document Reviewed: 01/07/2011 [facility] Patient Information 2014 [place], [place].   Back Exercises Back exercises help treat and prevent back injuries. The goal of back exercises is to increase the strength of your abdominal and back muscles and the flexibility of your back. These exercises should be started when you no longer have back pain. Back exercises include:  Pelvic Tilt. Lie on your back with your knees bent. Tilt your pelvis until the lower part of your back is against the floor. Hold this position 5 to 10 sec and repeat 5 to 10  times.  Knee to Chest. Pull first 1 knee up against your chest and hold for 20 to 30 seconds, repeat this with the other knee, and then both knees. This may be done with the other leg straight or bent, whichever feels better.  Sit-Ups or Curl-Ups. Bend your knees 90 degrees. Start with tilting your pelvis, and do a partial, slow sit-up, lifting your trunk only 30 to 45 degrees off the floor. Take at least 2 to 3 seconds for each sit-up. Do not do sit-ups with your knees out straight. If partial sit-ups are difficult, simply do the above but with only tightening your abdominal muscles and holding it as directed.  Hip-Lift. Lie on your back with your knees flexed 90 degrees. Push down with your feet and shoulders as you raise your hips a couple inches off the floor; hold for 10 seconds, repeat 5 to 10 times.  Back arches. Lie on your stomach, propping yourself up on bent elbows. Slowly press on your hands, causing an arch in your low back. Repeat 3 to 5 times. Any initial stiffness and discomfort should lessen with repetition over time.  Shoulder-Lifts. Lie face down with arms beside your body. Keep hips and torso pressed to floor as you slowly lift your head and shoulders off the floor. Do not overdo your exercises, especially in the beginning. Exercises may cause you some mild back discomfort which lasts for a few minutes; however, if the pain is more severe, or lasts for more than 15  minutes, do not continue exercises until you see your caregiver. Improvement with exercise therapy for back problems is slow.  See your caregivers for assistance with developing a proper back exercise program. Document Released: 01/12/2005 Document Revised: 02/27/2012 Document Reviewed: 10/06/2011 [facility] Patient Information 2014 [place].

## 2014-06-02 NOTE — ED Notes (Signed)
Pt in stating she fell last night and is c/o right hip pain, pt ambulatory to room without distress, increased pain with movement, denies bruising to site

## 2014-06-03 NOTE — ED Provider Notes (Signed)
Medical screening examination/treatment/procedure(s) were performed by non-physician practitioner and as supervising physician I was immediately available for consultation/collaboration.   EKG Interpretation None       Richarda Blade, MD 06/03/14 1225

## 2014-07-21 ENCOUNTER — Ambulatory Visit: Payer: Self-pay | Admitting: Internal Medicine

## 2014-08-27 ENCOUNTER — Inpatient Hospital Stay (HOSPITAL_COMMUNITY): Payer: Self-pay

## 2014-08-27 ENCOUNTER — Encounter: Payer: Self-pay | Admitting: Obstetrics & Gynecology

## 2014-08-27 ENCOUNTER — Encounter (HOSPITAL_COMMUNITY): Payer: Self-pay | Admitting: *Deleted

## 2014-08-27 ENCOUNTER — Inpatient Hospital Stay (HOSPITAL_COMMUNITY)
Admission: AD | Admit: 2014-08-27 | Discharge: 2014-08-27 | Disposition: A | Discharge status: Home / Self Care | Payer: Self-pay | Source: Ambulatory Visit | Attending: Family Medicine | Admitting: Family Medicine

## 2014-08-27 DIAGNOSIS — N83202 Unspecified ovarian cyst, left side: Secondary | ICD-10-CM

## 2014-08-27 DIAGNOSIS — I10 Essential (primary) hypertension: Secondary | ICD-10-CM | POA: Insufficient documentation

## 2014-08-27 DIAGNOSIS — R109 Unspecified abdominal pain: Secondary | ICD-10-CM | POA: Insufficient documentation

## 2014-08-27 DIAGNOSIS — K219 Gastro-esophageal reflux disease without esophagitis: Secondary | ICD-10-CM | POA: Insufficient documentation

## 2014-08-27 DIAGNOSIS — N83209 Unspecified ovarian cyst, unspecified side: Secondary | ICD-10-CM | POA: Insufficient documentation

## 2014-08-27 LAB — WET PREP, GENITAL
CLUE CELLS WET PREP: NONE SEEN
Trich, Wet Prep: NONE SEEN
Yeast Wet Prep HPF POC: NONE SEEN

## 2014-08-27 LAB — POCT PREGNANCY, URINE: Preg Test, Ur: NEGATIVE

## 2014-08-27 LAB — URINE MICROSCOPIC-ADD ON

## 2014-08-27 LAB — URINALYSIS, ROUTINE W REFLEX MICROSCOPIC
GLUCOSE, UA: NEGATIVE mg/dL
KETONES UR: NEGATIVE mg/dL
LEUKOCYTES UA: NEGATIVE
Nitrite: NEGATIVE
PH: 5.5 (ref 5.0–8.0)
Protein, ur: 30 mg/dL — AB
Specific Gravity, Urine: 1.025 (ref 1.005–1.030)
Urobilinogen, UA: 0.2 mg/dL (ref 0.0–1.0)

## 2014-08-27 MED ORDER — KETOROLAC TROMETHAMINE 60 MG/2ML IM SOLN
60.0000 mg | Freq: Once | INTRAMUSCULAR | Status: AC
  Administered 2014-08-27: 60 mg via INTRAMUSCULAR
  Filled 2014-08-27: qty 2

## 2014-08-27 MED ORDER — HYDROCODONE-ACETAMINOPHEN 5-325 MG PO TABS
2.0000 | ORAL_TABLET | Freq: Once | ORAL | Status: AC
  Administered 2014-08-27: 2 via ORAL
  Filled 2014-08-27: qty 2

## 2014-08-27 MED ORDER — HYDROCODONE-ACETAMINOPHEN 5-325 MG PO TABS
2.0000 | ORAL_TABLET | Freq: Once | ORAL | Status: DC
Start: 2014-08-27 — End: 2014-09-30

## 2014-08-27 NOTE — Discharge Instructions (Signed)
Ovarian Cyst An ovarian cyst is a fluid-filled sac that forms on an ovary. The ovaries are small organs that produce eggs in women. Various types of cysts can form on the ovaries. Most are not cancerous. Many do not cause problems, and they often go away on their own. Some may cause symptoms and require treatment. Common types of ovarian cysts include:  Functional cysts--These cysts may occur every month during the menstrual cycle. This is normal. The cysts usually go away with the next menstrual cycle if the woman does not get pregnant. Usually, there are no symptoms with a functional cyst.  Endometrioma cysts--These cysts form from the tissue that lines the uterus. They are also called "chocolate cysts" because they become filled with blood that turns brown. This type of cyst can cause pain in the lower abdomen during intercourse and with your menstrual period.  Cystadenoma cysts--This type develops from the cells on the outside of the ovary. These cysts can get very big and cause lower abdomen pain and pain with intercourse. This type of cyst can twist on itself, cut off its blood supply, and cause severe pain. It can also easily rupture and cause a lot of pain.  Dermoid cysts--This type of cyst is sometimes found in both ovaries. These cysts may contain different kinds of body tissue, such as skin, teeth, hair, or cartilage. They usually do not cause symptoms unless they get very big.  Theca lutein cysts--These cysts occur when too much of a certain hormone (human chorionic gonadotropin) is produced and overstimulates the ovaries to produce an egg. This is most common after procedures used to assist with the conception of a baby (in vitro fertilization). CAUSES   Fertility drugs can cause a condition in which multiple large cysts are formed on the ovaries. This is called ovarian hyperstimulation syndrome.  A condition called polycystic ovary syndrome can cause hormonal imbalances that can lead to  nonfunctional ovarian cysts. SIGNS AND SYMPTOMS  Many ovarian cysts do not cause symptoms. If symptoms are present, they may include:  Pelvic pain or pressure.  Pain in the lower abdomen.  Pain during sexual intercourse.  Increasing girth (swelling) of the abdomen.  Abnormal menstrual periods.  Increasing pain with menstrual periods.  Stopping having menstrual periods without being pregnant. DIAGNOSIS  These cysts are commonly found during a routine or annual pelvic exam. Tests may be ordered to find out more about the cyst. These tests may include:  Ultrasound.  X-ray of the pelvis.  CT scan.  MRI.  Blood tests. TREATMENT  Many ovarian cysts go away on their own without treatment. Your health care provider may want to check your cyst regularly for 2-3 months to see if it changes. For women in menopause, it is particularly important to monitor a cyst closely because of the higher rate of ovarian cancer in menopausal women. When treatment is needed, it may include any of the following:  A procedure to drain the cyst (aspiration). This may be done using a long needle and ultrasound. It can also be done through a laparoscopic procedure. This involves using a thin, lighted tube with a tiny camera on the end (laparoscope) inserted through a small incision.  Surgery to remove the whole cyst. This may be done using laparoscopic surgery or an open surgery involving a larger incision in the lower abdomen.  Hormone treatment or birth control pills. These methods are sometimes used to help dissolve a cyst. HOME CARE INSTRUCTIONS   Only take over-the-counter   or prescription medicines as directed by your health care provider.  Follow up with your health care provider as directed.  Get regular pelvic exams and Pap tests. SEEK MEDICAL CARE IF:   Your periods are late, irregular, or painful, or they stop.  Your pelvic pain or abdominal pain does not go away.  Your abdomen becomes  larger or swollen.  You have pressure on your bladder or trouble emptying your bladder completely.  You have pain during sexual intercourse.  You have feelings of fullness, pressure, or discomfort in your stomach.  You lose weight for no apparent reason.  You feel generally ill.  You become constipated.  You lose your appetite.  You develop acne.  You have an increase in body and facial hair.  You are gaining weight, without changing your exercise and eating habits.  You think you are pregnant. SEEK IMMEDIATE MEDICAL CARE IF:   You have increasing abdominal pain.  You feel sick to your stomach (nauseous), and you throw up (vomit).  You develop a fever that comes on suddenly.  You have abdominal pain during a bowel movement.  Your menstrual periods become heavier than usual. MAKE SURE YOU:  Understand these instructions.  Will watch your condition.  Will get help right away if you are not doing well or get worse. Document Released: 12/05/2005 Document Revised: 12/10/2013 Document Reviewed: 08/12/2013 [employer] Patient Information 2015 [employer]. This information is not intended to replace advice given to you by your health care provider. Make sure you discuss any questions you have with your health care provider.  

## 2014-08-27 NOTE — MAU Provider Note (Signed)
History     CSN: [id number]  Arrival date and time: 08/27/14 0116   First Provider Initiated Contact with Patient 08/27/14 0358      No chief complaint on file.  HPI  [name] is a 38 y.o. [id number] who presents today with lower abdominal pain. She states that the pain started on Monday, and started to get worse all day on Tuesday. She rates her pain 10/10. She has taken various medications to try to help the pain. She states that the she last saw Dr. Kalman Shan for her OBGYN, and has not been seen in a while anywhere else. She states that the pain is always worse around the time of her period. Sometimes before, sometimes during and sometimes after.   Past Medical History  Diagnosis Date  . Bipolar 1 disorder, depressed, mild   . Migraines   . GERD (gastroesophageal reflux disease)   . Bilateral ovarian cysts   . Asthma   . Pregnancy induced hypertension   . HTN (hypertension)   . Anemia     Past Surgical History  Procedure Laterality Date  . Cesarean section    . Induced abortion    . Tubal ligation      No family history on file.  History  Substance Use Topics  . Smoking status: Former Smoker -- 0.50 packs/day    Types: Cigarettes    Quit date: 04/09/2014  . Smokeless tobacco: Never Used  . Alcohol Use: Yes     Comment: twice a year    Allergies:  Allergies  Allergen Reactions  . Prednisone Swelling and Rash    Leg swells up; pt reports I.V use with no problems    Prescriptions prior to admission  Medication Sig Dispense Refill  . albuterol (PROVENTIL HFA;VENTOLIN HFA) 108 (90 BASE) MCG/ACT inhaler Inhale 2 puffs into the lungs every 6 (six) hours as needed for wheezing or shortness of breath.      Marland Kitchen albuterol (PROVENTIL) (2.5 MG/3ML) 0.083% nebulizer solution Take 2.5 mg by nebulization every 6 (six) hours as needed for wheezing or shortness of breath.      Marland Kitchen albuterol (PROVENTIL) (2.5 MG/3ML) 0.083% nebulizer solution Take 3 mLs (2.5 mg total) by  nebulization every 4 (four) hours as needed for wheezing or shortness of breath.  30 vial  0  . fluticasone (FLOVENT HFA) 44 MCG/ACT inhaler Inhale 1 puff into the lungs 2 (two) times daily.  1 Inhaler  12  . hydrochlorothiazide (HYDRODIURIL) 25 MG tablet Take 1 tablet (25 mg total) by mouth daily.  30 tablet  2  . ibuprofen (ADVIL,MOTRIN) 800 MG tablet Take 1 tablet (800 mg total) by mouth 3 (three) times daily.  21 tablet  0  . methocarbamol (ROBAXIN) 500 MG tablet Take 1 tablet (500 mg total) by mouth 2 (two) times daily.  20 tablet  0    ROS Physical Exam   Blood pressure 139/80, pulse 85, temperature 98.4 F (36.9 C), temperature source Oral, resp. rate 20, height 5' (1.524 m), weight 72.576 kg (160 lb), last menstrual period 08/25/2014.  Physical Exam  Nursing note and vitals reviewed. Constitutional: She is oriented to person, place, and time. She appears well-developed and well-nourished. No distress.  Cardiovascular: Normal rate.   Respiratory: Effort normal.  GI: Soft. There is no tenderness. There is no rebound.  Genitourinary:   External: no lesion Vagina: small amount of blood seen  Cervix: no CMT Uterus: slightly enlarged  Adnexa: NT    Neurological:  She is alert and oriented to person, place, and time.  Skin: Skin is warm and dry.  Psychiatric: She has a normal mood and affect.    MAU Course  Procedures Results for orders placed during the hospital encounter of 08/27/14 (from the past 24 hour(s))  URINALYSIS, ROUTINE W REFLEX MICROSCOPIC     Status: Abnormal   Collection Time    08/27/14  1:44 AM      Result Value Ref Range   Color, Urine YELLOW  YELLOW   APPearance CLEAR  CLEAR   Specific Gravity, Urine 1.025  1.005 - 1.030   pH 5.5  5.0 - 8.0   Glucose, UA NEGATIVE  NEGATIVE mg/dL   Hgb urine dipstick LARGE (*) NEGATIVE   Bilirubin Urine SMALL (*) NEGATIVE   Ketones, ur NEGATIVE  NEGATIVE mg/dL   Protein, ur 30 (*) NEGATIVE mg/dL   Urobilinogen, UA  0.2  0.0 - 1.0 mg/dL   Nitrite NEGATIVE  NEGATIVE   Leukocytes, UA NEGATIVE  NEGATIVE  URINE MICROSCOPIC-ADD ON     Status: Abnormal   Collection Time    08/27/14  1:44 AM      Result Value Ref Range   Squamous Epithelial / LPF RARE  RARE   WBC, UA 3-6  <3 WBC/hpf   RBC / HPF TOO NUMEROUS TO COUNT  <3 RBC/hpf   Bacteria, UA FEW (*) RARE  POCT PREGNANCY, URINE     Status: None   Collection Time    08/27/14  2:11 AM      Result Value Ref Range   Preg Test, Ur NEGATIVE  NEGATIVE  WET PREP, GENITAL     Status: Abnormal   Collection Time    08/27/14  4:10 AM      Result Value Ref Range   Yeast Wet Prep HPF POC NONE SEEN  NONE SEEN   Trich, Wet Prep NONE SEEN  NONE SEEN   Clue Cells Wet Prep HPF POC NONE SEEN  NONE SEEN   WBC, Wet Prep HPF POC FEW (*) NONE SEEN   US Transvaginal Non-ob  08/27/2014   CLINICAL DATA:  Right lower quadrant pain.  EXAM: TRANSABDOMINAL AND TRANSVAGINAL ULTRASOUND OF PELVIS  TECHNIQUE: Both transabdominal and transvaginal ultrasound examinations of the pelvis were performed. Transabdominal technique was performed for global imaging of the pelvis including uterus, ovaries, adnexal regions, and pelvic cul-de-sac. It was necessary to proceed with endovaginal exam following the transabdominal exam to visualize the uterus and ovaries.  COMPARISON:  01/12/2014  FINDINGS: Uterus  Measurements: 9.2 x 4.1 x 4.4 cm. Small circumscribed hypoechoic lesions demonstrated, measuring up to maximum 1.3 cm consistent with uterine fibroids. Two of these are located posteriorly along the superior fundus and the smallest is located centrally towards the left.  Endometrium  Thickness: 5.9 mm.  No focal abnormality visualized.  Right ovary  Measurements: 3.5 x 2.3 x 2.6 cm. Normal appearance/no adnexal mass.  Left ovary  Measurements: 4.7 x 2.1 x 2 cm. A complex cystic lesion containing heterogeneous lacy internal echoes is present, measuring 2.4 cm maximal diameter. This is consistent with a  hemorrhagic cyst.  Other findings  No free fluid.  IMPRESSION: Small uterine fibroids, stable since prior study. Endometrium and right ovary are unremarkable. 2.4 cm diameter hemorrhagic cyst in the left ovary. No follow-up is necessary in a premenopausal patient. This recommendation follows the consensus statement: Management of Asymptomatic Ovarian and Other Adnexal Cysts Imaged at [place]: Society of Radiologists in [place]  Statement. Radiology 2010; [phone].   Electronically Signed   By: Lucienne Capers M.D.   On: 08/27/2014 04:58   US Pelvis Complete  08/27/2014   CLINICAL DATA:  Right lower quadrant pain.  EXAM: TRANSABDOMINAL AND TRANSVAGINAL ULTRASOUND OF PELVIS  TECHNIQUE: Both transabdominal and transvaginal ultrasound examinations of the pelvis were performed. Transabdominal technique was performed for global imaging of the pelvis including uterus, ovaries, adnexal regions, and pelvic cul-de-sac. It was necessary to proceed with endovaginal exam following the transabdominal exam to visualize the uterus and ovaries.  COMPARISON:  01/12/2014  FINDINGS: Uterus  Measurements: 9.2 x 4.1 x 4.4 cm. Small circumscribed hypoechoic lesions demonstrated, measuring up to maximum 1.3 cm consistent with uterine fibroids. Two of these are located posteriorly along the superior fundus and the smallest is located centrally towards the left.  Endometrium  Thickness: 5.9 mm.  No focal abnormality visualized.  Right ovary  Measurements: 3.5 x 2.3 x 2.6 cm. Normal appearance/no adnexal mass.  Left ovary  Measurements: 4.7 x 2.1 x 2 cm. A complex cystic lesion containing heterogeneous lacy internal echoes is present, measuring 2.4 cm maximal diameter. This is consistent with a hemorrhagic cyst.  Other findings  No free fluid.  IMPRESSION: Small uterine fibroids, stable since prior study. Endometrium and right ovary are unremarkable. 2.4 cm diameter hemorrhagic cyst in the left ovary. No follow-up is  necessary in a premenopausal patient. This recommendation follows the consensus statement: Management of Asymptomatic Ovarian and Other Adnexal Cysts Imaged at [place]: Society of Radiologists in [place]. Radiology 2010; [phone].   Electronically Signed   By: Lucienne Capers M.D.   On: 08/27/2014 04:58     Assessment and Plan   1. Cyst of left ovary    RX: vicodin #[id number]  Follow-up Information   Follow up with [facility]. (they will call you with an appointment )    Specialty:  Obstetrics and Gynecology   Contact information:   [place] [address] [phone]     Mathis Bud 08/27/2014, 4:15 AM

## 2014-08-27 NOTE — Progress Notes (Signed)
No s/s of reaction to medication noted.

## 2014-08-27 NOTE — MAU Note (Signed)
PT SAYS SHE STARTED CYCLE ON Monday-  -HAS HX OF PAINFUL CYCLES- SAW DR ROSE-  AND SOMETIMES COMES   HERE.   USUALLY TAKES  IBUPROFEN   BUT THIS TIME NO RELIEF-   AND MANY OTHER MEDS.    TOOK BC AT 12 AND 6PM.  TOOK Brice AT    2315.   HAS SOAKED IN TUB.     HAS PASSED -CLOTS- LARGER  THAN QUARTER.     HAD BTL- 1996.Marland Kitchen

## 2014-08-28 LAB — URINE CULTURE

## 2014-08-28 LAB — GC/CHLAMYDIA PROBE AMP
CT PROBE, AMP APTIMA: NEGATIVE
GC Probe RNA: NEGATIVE

## 2014-08-28 NOTE — MAU Provider Note (Signed)
Attestation of Attending Supervision of Advanced Practitioner (PA/CNM/NP): Evaluation and management procedures were performed by the Advanced Practitioner under my supervision and collaboration.  I have reviewed the Advanced Practitioner's note and chart, and I agree with the management and plan.  Jacob Stinson, DO Attending Physician Faculty Practice, [facility]  

## 2014-09-30 ENCOUNTER — Encounter (HOSPITAL_COMMUNITY): Payer: Self-pay | Admitting: Emergency Medicine

## 2014-09-30 ENCOUNTER — Emergency Department (HOSPITAL_COMMUNITY): Payer: Self-pay

## 2014-09-30 ENCOUNTER — Emergency Department (HOSPITAL_COMMUNITY)
Admission: EM | Admit: 2014-09-30 | Discharge: 2014-09-30 | Disposition: A | Discharge status: Home / Self Care | Payer: Self-pay | Attending: Emergency Medicine | Admitting: Emergency Medicine

## 2014-09-30 ENCOUNTER — Other Ambulatory Visit: Payer: Self-pay

## 2014-09-30 DIAGNOSIS — Z87891 Personal history of nicotine dependence: Secondary | ICD-10-CM | POA: Insufficient documentation

## 2014-09-30 DIAGNOSIS — M25511 Pain in right shoulder: Secondary | ICD-10-CM | POA: Insufficient documentation

## 2014-09-30 DIAGNOSIS — Z862 Personal history of diseases of the blood and blood-forming organs and certain disorders involving the immune mechanism: Secondary | ICD-10-CM | POA: Insufficient documentation

## 2014-09-30 DIAGNOSIS — Z8742 Personal history of other diseases of the female genital tract: Secondary | ICD-10-CM | POA: Insufficient documentation

## 2014-09-30 DIAGNOSIS — Z79899 Other long term (current) drug therapy: Secondary | ICD-10-CM | POA: Insufficient documentation

## 2014-09-30 DIAGNOSIS — Z8659 Personal history of other mental and behavioral disorders: Secondary | ICD-10-CM | POA: Insufficient documentation

## 2014-09-30 DIAGNOSIS — J4521 Mild intermittent asthma with (acute) exacerbation: Secondary | ICD-10-CM | POA: Insufficient documentation

## 2014-09-30 DIAGNOSIS — I10 Essential (primary) hypertension: Secondary | ICD-10-CM | POA: Insufficient documentation

## 2014-09-30 DIAGNOSIS — Z8719 Personal history of other diseases of the digestive system: Secondary | ICD-10-CM | POA: Insufficient documentation

## 2014-09-30 DIAGNOSIS — M62838 Other muscle spasm: Secondary | ICD-10-CM | POA: Insufficient documentation

## 2014-09-30 DIAGNOSIS — J4520 Mild intermittent asthma, uncomplicated: Secondary | ICD-10-CM

## 2014-09-30 DIAGNOSIS — G44219 Episodic tension-type headache, not intractable: Secondary | ICD-10-CM | POA: Insufficient documentation

## 2014-09-30 MED ORDER — KETOROLAC TROMETHAMINE 30 MG/ML IJ SOLN
30.0000 mg | Freq: Once | INTRAMUSCULAR | Status: AC
  Administered 2014-09-30: 30 mg via INTRAVENOUS
  Filled 2014-09-30: qty 1

## 2014-09-30 MED ORDER — ALBUTEROL SULFATE HFA 108 (90 BASE) MCG/ACT IN AERS: 2.0000 | INHALATION_SPRAY | Freq: Four times a day (QID) | RESPIRATORY_TRACT | Status: DC | PRN

## 2014-09-30 MED ORDER — IPRATROPIUM-ALBUTEROL 0.5-2.5 (3) MG/3ML IN SOLN
3.0000 mL | Freq: Once | RESPIRATORY_TRACT | Status: AC
  Administered 2014-09-30: 3 mL via RESPIRATORY_TRACT
  Filled 2014-09-30: qty 3

## 2014-09-30 MED ORDER — SODIUM CHLORIDE 0.9 % IV BOLUS (SEPSIS)
1000.0000 mL | Freq: Once | INTRAVENOUS | Status: AC
  Administered 2014-09-30: 1000 mL via INTRAVENOUS

## 2014-09-30 MED ORDER — LORAZEPAM 1 MG PO TABS: 0.5000 mg | ORAL_TABLET | Freq: Three times a day (TID) | ORAL | Status: DC | PRN

## 2014-09-30 MED ORDER — MELOXICAM 15 MG PO TABS
15.0000 mg | ORAL_TABLET | Freq: Every day | ORAL | Status: DC
Start: 2014-09-30 — End: 2015-03-21

## 2014-09-30 MED ORDER — DEXAMETHASONE SODIUM PHOSPHATE 10 MG/ML IJ SOLN
10.0000 mg | Freq: Once | INTRAMUSCULAR | Status: AC
  Administered 2014-09-30: 10 mg via INTRAVENOUS
  Filled 2014-09-30: qty 1

## 2014-09-30 MED ORDER — ALBUTEROL SULFATE (2.5 MG/3ML) 0.083% IN NEBU
5.0000 mg | INHALATION_SOLUTION | Freq: Once | RESPIRATORY_TRACT | Status: AC
  Administered 2014-09-30: 5 mg via RESPIRATORY_TRACT
  Filled 2014-09-30: qty 6

## 2014-09-30 NOTE — ED Provider Notes (Signed)
CSN: [id number]     Arrival date & time 09/30/14  1102 History   First MD Initiated Contact with Patient 09/30/14 1241     Chief Complaint  Patient presents with  . Shortness of Breath  . Headache     (Consider location/radiation/quality/duration/timing/severity/associated sxs/prior Treatment) HPI   HOLDEN DRAUGHON is a(n) 38 y.o. female who presents to the emergency department chief complaint of headache and asthma exacerbation. His past medical history of migraine headaches and asthma. She also has a history of tobacco abuse. Patient states that she's had a headache for the past 4 days. She states that it has been constant, worse when she turns her neck to the right. She complains of jaw tension and right shoulder pain as well. She denies any changes in vision, fever, nausea, vomiting, or other focal neurologic abnormalities. She states at this headache is different from her previous migraine headaches. It has been unrelieved with Tylenol at home. Patient states that yesterday when her coworkers were heavy perfume which triggered an asthma attack. She is out of her inhaler. She is a chronic daily smoker.   Past Medical History  Diagnosis Date  . Bipolar 1 disorder, depressed, mild   . Migraines   . GERD (gastroesophageal reflux disease)   . Bilateral ovarian cysts   . Asthma   . Pregnancy induced hypertension   . HTN (hypertension)   . Anemia    Past Surgical History  Procedure Laterality Date  . Cesarean section    . Induced abortion    . Tubal ligation     History reviewed. No pertinent family history. History  Substance Use Topics  . Smoking status: Former Smoker -- 0.50 packs/day    Types: Cigarettes    Quit date: 04/09/2014  . Smokeless tobacco: Never Used  . Alcohol Use: Yes     Comment: twice a year   OB History   Grav Para Term Preterm Abortions TAB SAB Ect Mult Living   3 2 0 1 1 1    2      Review of Systems Ten systems reviewed and are negative for  acute change, except as noted in the HPI.   Prednisone  Home Medications   Prior to Admission medications   Medication Sig Start Date End Date Taking? Authorizing Provider  acetaminophen (TYLENOL) 500 MG tablet Take 1,000 mg by mouth every 6 (six) hours as needed for mild pain or headache.   Yes Historical Provider, MD  albuterol (PROVENTIL HFA;VENTOLIN HFA) 108 (90 BASE) MCG/ACT inhaler Inhale 2 puffs into the lungs every 6 (six) hours as needed for wheezing or shortness of breath.   Yes Historical Provider, MD   BP 118/81  Pulse 87  Temp(Src) 98.6 F (37 C) (Oral)  Resp 20  SpO2 98%  LMP 09/20/2014 Physical Exam  Nursing note and vitals reviewed. Constitutional: She is oriented to person, place, and time. She appears well-developed and well-nourished. No distress.  Patient tearful  HENT:  Head: Normocephalic and atraumatic.  Eyes: Conjunctivae and EOM are normal. Pupils are equal, round, and reactive to light. No scleral icterus.  Neck: Normal range of motion.  Cardiovascular: Normal rate, regular rhythm and normal heart sounds.  Exam reveals no gallop and no friction rub.   No murmur heard. Pulmonary/Chest: Effort normal. No respiratory distress. She has wheezes.  Abdominal: Soft. Bowel sounds are normal. She exhibits no distension and no mass. There is no tenderness. There is no guarding.  Musculoskeletal:   Guarding neck  movement.  R Trapezius/levator spasm  Neurological: She is alert and oriented to person, place, and time.  Speech is clear and goal oriented, follows commands Major Cranial nerves without deficit, no facial droop Normal strength in upper and lower extremities bilaterally including dorsiflexion and plantar flexion, strong and equal grip strength Sensation normal to light and sharp touch Moves extremities without ataxia, coordination intact Normal finger to nose and rapid alternating movements Neg romberg, no pronator drift Normal gait Normal heel-shin  and balance   Skin: Skin is warm and dry. She is not diaphoretic.    ED Course  Procedures (including critical care time) Labs Review Labs Reviewed - No data to display  Imaging Review Dg Chest 2 View (if Patient Has Fever And/or Copd)  09/30/2014   CLINICAL DATA:  38 year old female with acute shortness of breath, headache, nasal congestion and wheezing. Initial encounter.  EXAM: CHEST  2 VIEW  COMPARISON:  04/07/2014 and earlier.  FINDINGS: Stable and normal lung volumes. Normal cardiac size and mediastinal contours. Visualized tracheal air column is within normal limits. Lung parenchyma stable and clear. No pneumothorax or effusion. Stable visualized osseous structures.  IMPRESSION: Negative, no acute cardiopulmonary abnormality.   Electronically Signed   By: Lars Pinks M.D.   On: 09/30/2014 11:56     EKG Interpretation None      MDM   Final diagnoses:  Episodic tension-type headache, not intractable  Asthma, mild intermittent, uncomplicated    Patient Whezing relieved with duoneb. Pt HA treated and improved while in ED.  Presentation is like pts typical HA and non concerning for [facility], ICH, Meningitis, or temporal arteritis. Pt is afebrile with no focal neuro deficits, nuchal rigidity, or change in vision. Pt is to follow up with PCP to discuss prophylactic medication. Pt verbalizes understanding and is agreeable with plan to dc.     Margarita Mail, PA-C 09/30/14 1535

## 2014-09-30 NOTE — ED Notes (Signed)
Patient very tearful .

## 2014-09-30 NOTE — Progress Notes (Signed)
[place],  Provided pt with a list of primary care resources. Patient stated that she was pending insurance through job.

## 2014-09-30 NOTE — ED Notes (Addendum)
Pt c/o headache x 4 days, nasal congestion x "a couple days," and SOB x 1 day.  Pain score 7/10.  Pt reports someone at her workplace had on a strong perfume yesterday and she believes it caused her asthma to flare up.  Sts she ran out of her inhaler.

## 2014-09-30 NOTE — Discharge Instructions (Signed)
[email or web address] [facility] [address] [place], [place] [address] [place] [phone] Email: [email or web address]   You are having a headache. No specific cause was found today for your headache. It may have been a migraine or other cause of headache. Stress, anxiety, fatigue, and depression are common triggers for headaches. Your headache today does not appear to be life-threatening or require hospitalization, but often the exact cause of headaches is not determined in the emergency department. Therefore, follow-up with your doctor is very important to find out what may have caused your headache, and whether or not you need any further diagnostic testing or treatment. Sometimes headaches can appear benign (not harmful), but then more serious symptoms can develop which should prompt an immediate re-evaluation by your doctor or the emergency department. SEEK MEDICAL ATTENTION IF: You develop possible problems with medications prescribed.  The medications don't resolve your headache, if it recurs , or if you have multiple episodes of vomiting or can't take fluids. You have a change from the usual headache. RETURN IMMEDIATELY IF you develop a sudden, severe headache or confusion, become poorly responsive or faint, develop a fever above 100.67F or problem breathing, have a change in speech, vision, swallowing, or understanding, or develop new weakness, numbness, tingling, incoordination, or have a seizure.   Asthma Asthma is a recurring condition in which the airways tighten and narrow. Asthma can make it difficult to breathe. It can cause coughing, wheezing, and shortness of breath. Asthma episodes, also called asthma attacks, range from minor to life-threatening. Asthma cannot be cured, but medicines and lifestyle changes can help control it. CAUSES Asthma is believed to be caused by inherited (genetic) and environmental  factors, but its exact cause is unknown. Asthma may be triggered by allergens, lung infections, or irritants in the air. Asthma triggers are different for each person. Common triggers include:   Animal dander.  Dust mites.  Cockroaches.  Pollen from trees or grass.  Mold.  Smoke.  Air pollutants such as dust, household cleaners, hair sprays, aerosol sprays, paint fumes, strong chemicals, or strong odors.  Cold air, weather changes, and winds (which increase molds and pollens in the air).  Strong emotional expressions such as crying or laughing hard.  Stress.  Certain medicines (such as aspirin) or types of drugs (such as beta-blockers).  Sulfites in foods and drinks. Foods and drinks that may contain sulfites include dried fruit, potato chips, and sparkling grape juice.  Infections or inflammatory conditions such as the flu, a cold, or an inflammation of the nasal membranes (rhinitis).  Gastroesophageal reflux disease (GERD).  Exercise or strenuous activity. SYMPTOMS Symptoms may occur immediately after asthma is triggered or many hours later. Symptoms include:  Wheezing.  Excessive nighttime or early morning coughing.  Frequent or severe coughing with a common cold.  Chest tightness.  Shortness of breath. DIAGNOSIS  The diagnosis of asthma is made by a review of your medical history and a physical exam. Tests may also be performed. These may include:  Lung function studies. These tests show how much air you breathe in and out.  Allergy tests.  Imaging tests such as X-rays. TREATMENT  Asthma cannot be cured, but it can usually be controlled. Treatment involves identifying and avoiding your asthma triggers. It also involves medicines. There are 2 classes of medicine used for asthma treatment:   Controller medicines. These prevent asthma symptoms from occurring. They are usually taken every day.  Reliever or rescue medicines. These quickly relieve asthma  symptoms.  They are used as needed and provide short-term relief. Your health care provider will help you create an asthma action plan. An asthma action plan is a written plan for managing and treating your asthma attacks. It includes a list of your asthma triggers and how they may be avoided. It also includes information on when medicines should be taken and when their dosage should be changed. An action plan may also involve the use of a device called a peak flow meter. A peak flow meter measures how well the lungs are working. It helps you monitor your condition. HOME CARE INSTRUCTIONS   Take medicines only as directed by your health care provider. Speak with your health care provider if you have questions about how or when to take the medicines.  Use a peak flow meter as directed by your health care provider. Record and keep track of readings.  Understand and use the action plan to help minimize or stop an asthma attack without needing to seek medical care.  Control your home environment in the following ways to help prevent asthma attacks:  Do not smoke. Avoid being exposed to secondhand smoke.  Change your heating and air conditioning filter regularly.  Limit your use of fireplaces and wood stoves.  Get rid of pests (such as roaches and mice) and their droppings.  Throw away plants if you see mold on them.  Clean your floors and dust regularly. Use unscented cleaning products.  Try to have someone else vacuum for you regularly. Stay out of rooms while they are being vacuumed and for a short while afterward. If you vacuum, use a dust mask from a hardware store, a double-layered or microfilter vacuum cleaner bag, or a vacuum cleaner with a HEPA filter.  Replace carpet with wood, tile, or vinyl flooring. Carpet can trap dander and dust.  Use allergy-proof pillows, mattress covers, and box spring covers.  Wash bed sheets and blankets every week in hot water and dry them in a  dryer.  Use blankets that are made of polyester or cotton.  Clean bathrooms and kitchens with bleach. If possible, have someone repaint the walls in these rooms with mold-resistant paint. Keep out of the rooms that are being cleaned and painted.  Wash hands frequently. SEEK MEDICAL CARE IF:   You have wheezing, shortness of breath, or a cough even if taking medicine to prevent attacks.  The colored mucus you cough up (sputum) is thicker than usual.  Your sputum changes from clear or white to yellow, green, gray, or bloody.  You have any problems that may be related to the medicines you are taking (such as a rash, itching, swelling, or trouble breathing).  You are using a reliever medicine more than 2-3 times per week.  Your peak flow is still at 50-79% of your personal best after following your action plan for 1 hour.  You have a fever. SEEK IMMEDIATE MEDICAL CARE IF:   You seem to be getting worse and are unresponsive to treatment during an asthma attack.  You are short of breath even at rest.  You get short of breath when doing very little physical activity.  You have difficulty eating, drinking, or talking due to asthma symptoms.  You develop chest pain.  You develop a fast heartbeat.  You have a bluish color to your lips or fingernails.  You are light-headed, dizzy, or faint.  Your peak flow is less than 50% of your personal best. MAKE SURE YOU:   Understand  these instructions.  Will watch your condition.  Will get help right away if you are not doing well or get worse. Document Released: 12/05/2005 Document Revised: 04/21/2014 Document Reviewed: 07/04/2013 [facility] Patient Information 2015 [place], [place]. This information is not intended to replace advice given to you by your health care provider. Make sure you discuss any questions you have with your health care provider.

## 2014-10-01 NOTE — ED Provider Notes (Signed)
Medical screening examination/treatment/procedure(s) were performed by non-physician practitioner and as supervising physician I was immediately available for consultation/collaboration.   EKG Interpretation None       Jasper Riling. Alvino Chapel, MD 10/01/14 1555

## 2014-10-13 ENCOUNTER — Encounter: Payer: Self-pay | Admitting: Obstetrics & Gynecology

## 2014-10-13 ENCOUNTER — Telehealth: Payer: Self-pay | Admitting: General Practice

## 2014-10-13 NOTE — Telephone Encounter (Signed)
Patient no showed for appt today. Called patient and she states she forgot it was today and would like the appt rescheduled. Told patient I will let our front office staff know and they will contact her with an appt. Patient verbalized understanding and had no other questions

## 2014-10-13 NOTE — Telephone Encounter (Signed)
Opened in error

## 2014-10-14 ENCOUNTER — Encounter: Payer: Self-pay | Admitting: General Practice

## 2014-10-14 ENCOUNTER — Encounter: Payer: Self-pay | Admitting: Obstetrics & Gynecology

## 2014-10-20 ENCOUNTER — Encounter (HOSPITAL_COMMUNITY): Payer: Self-pay | Admitting: Emergency Medicine

## 2014-12-17 ENCOUNTER — Encounter: Payer: Self-pay | Admitting: Obstetrics & Gynecology

## 2014-12-18 ENCOUNTER — Emergency Department (HOSPITAL_COMMUNITY): Payer: Self-pay

## 2014-12-18 ENCOUNTER — Emergency Department (HOSPITAL_COMMUNITY)
Admission: EM | Admit: 2014-12-18 | Discharge: 2014-12-18 | Disposition: A | Discharge status: Home / Self Care | Payer: Self-pay | Attending: Emergency Medicine | Admitting: Emergency Medicine

## 2014-12-18 ENCOUNTER — Encounter (HOSPITAL_COMMUNITY): Payer: Self-pay | Admitting: *Deleted

## 2014-12-18 DIAGNOSIS — Z862 Personal history of diseases of the blood and blood-forming organs and certain disorders involving the immune mechanism: Secondary | ICD-10-CM | POA: Insufficient documentation

## 2014-12-18 DIAGNOSIS — Z87891 Personal history of nicotine dependence: Secondary | ICD-10-CM | POA: Insufficient documentation

## 2014-12-18 DIAGNOSIS — Z8659 Personal history of other mental and behavioral disorders: Secondary | ICD-10-CM | POA: Insufficient documentation

## 2014-12-18 DIAGNOSIS — Z8742 Personal history of other diseases of the female genital tract: Secondary | ICD-10-CM | POA: Insufficient documentation

## 2014-12-18 DIAGNOSIS — Z79899 Other long term (current) drug therapy: Secondary | ICD-10-CM | POA: Insufficient documentation

## 2014-12-18 DIAGNOSIS — J45909 Unspecified asthma, uncomplicated: Secondary | ICD-10-CM | POA: Insufficient documentation

## 2014-12-18 DIAGNOSIS — Z3202 Encounter for pregnancy test, result negative: Secondary | ICD-10-CM | POA: Insufficient documentation

## 2014-12-18 DIAGNOSIS — R102 Pelvic and perineal pain: Secondary | ICD-10-CM | POA: Insufficient documentation

## 2014-12-18 DIAGNOSIS — I10 Essential (primary) hypertension: Secondary | ICD-10-CM | POA: Insufficient documentation

## 2014-12-18 DIAGNOSIS — R110 Nausea: Secondary | ICD-10-CM | POA: Insufficient documentation

## 2014-12-18 DIAGNOSIS — Z8719 Personal history of other diseases of the digestive system: Secondary | ICD-10-CM | POA: Insufficient documentation

## 2014-12-18 LAB — URINALYSIS, ROUTINE W REFLEX MICROSCOPIC
Bilirubin Urine: NEGATIVE
Glucose, UA: NEGATIVE mg/dL
KETONES UR: NEGATIVE mg/dL
NITRITE: NEGATIVE
PROTEIN: NEGATIVE mg/dL
Specific Gravity, Urine: 1.014 (ref 1.005–1.030)
Urobilinogen, UA: 1 mg/dL (ref 0.0–1.0)
pH: 6 (ref 5.0–8.0)

## 2014-12-18 LAB — CBC WITH DIFFERENTIAL/PLATELET
Basophils Absolute: 0 10*3/uL (ref 0.0–0.1)
Basophils Relative: 0 % (ref 0–1)
EOS ABS: 0.4 10*3/uL (ref 0.0–0.7)
Eosinophils Relative: 4 % (ref 0–5)
HCT: 41.7 % (ref 36.0–46.0)
HEMOGLOBIN: 14 g/dL (ref 12.0–15.0)
LYMPHS ABS: 4.1 10*3/uL — AB (ref 0.7–4.0)
Lymphocytes Relative: 41 % (ref 12–46)
MCH: 30.6 pg (ref 26.0–34.0)
MCHC: 33.6 g/dL (ref 30.0–36.0)
MCV: 91 fL (ref 78.0–100.0)
MONOS PCT: 7 % (ref 3–12)
Monocytes Absolute: 0.7 10*3/uL (ref 0.1–1.0)
NEUTROS ABS: 4.6 10*3/uL (ref 1.7–7.7)
NEUTROS PCT: 48 % (ref 43–77)
Platelets: 381 10*3/uL (ref 150–400)
RBC: 4.58 MIL/uL (ref 3.87–5.11)
RDW: 12.9 % (ref 11.5–15.5)
WBC: 9.9 10*3/uL (ref 4.0–10.5)

## 2014-12-18 LAB — COMPREHENSIVE METABOLIC PANEL
ALT: 13 U/L (ref 0–35)
ANION GAP: 10 (ref 5–15)
AST: 20 U/L (ref 0–37)
Albumin: 4.2 g/dL (ref 3.5–5.2)
Alkaline Phosphatase: 57 U/L (ref 39–117)
BUN: 6 mg/dL (ref 6–23)
CALCIUM: 9.4 mg/dL (ref 8.4–10.5)
CO2: 21 mmol/L (ref 19–32)
CREATININE: 0.94 mg/dL (ref 0.50–1.10)
Chloride: 105 mEq/L (ref 96–112)
GFR, EST AFRICAN AMERICAN: 89 mL/min — AB (ref >90)
GFR, EST NON AFRICAN AMERICAN: 77 mL/min — AB (ref >90)
GLUCOSE: 105 mg/dL — AB (ref 70–99)
Potassium: 3.7 mmol/L (ref 3.5–5.1)
Sodium: 136 mmol/L (ref 135–145)
TOTAL PROTEIN: 7.1 g/dL (ref 6.0–8.3)
Total Bilirubin: 0.9 mg/dL (ref 0.3–1.2)

## 2014-12-18 LAB — URINE MICROSCOPIC-ADD ON

## 2014-12-18 LAB — WET PREP, GENITAL
CLUE CELLS WET PREP: NONE SEEN
Trich, Wet Prep: NONE SEEN
YEAST WET PREP: NONE SEEN

## 2014-12-18 LAB — LIPASE, BLOOD: LIPASE: 27 U/L (ref 11–59)

## 2014-12-18 LAB — POC URINE PREG, ED: PREG TEST UR: NEGATIVE

## 2014-12-18 MED ORDER — SODIUM CHLORIDE 0.9 % IV BOLUS (SEPSIS)
1000.0000 mL | Freq: Once | INTRAVENOUS | Status: AC
  Administered 2014-12-18: 1000 mL via INTRAVENOUS

## 2014-12-18 MED ORDER — HYDROMORPHONE HCL 1 MG/ML IJ SOLN
1.0000 mg | Freq: Once | INTRAMUSCULAR | Status: DC | PRN
  Filled 2014-12-18: qty 1

## 2014-12-18 MED ORDER — MORPHINE SULFATE 4 MG/ML IJ SOLN
4.0000 mg | Freq: Once | INTRAMUSCULAR | Status: AC
  Administered 2014-12-18: 4 mg via INTRAVENOUS
  Filled 2014-12-18: qty 1

## 2014-12-18 MED ORDER — HYDROMORPHONE HCL 1 MG/ML IJ SOLN
1.0000 mg | Freq: Once | INTRAMUSCULAR | Status: AC
  Administered 2014-12-18: 1 mg via INTRAVENOUS
  Filled 2014-12-18: qty 1

## 2014-12-18 MED ORDER — IOHEXOL 300 MG/ML  SOLN
100.0000 mL | Freq: Once | INTRAMUSCULAR | Status: AC | PRN
  Administered 2014-12-18: 100 mL via INTRAVENOUS

## 2014-12-18 MED ORDER — OXYCODONE-ACETAMINOPHEN 5-325 MG PO TABS: 2.0000 | ORAL_TABLET | Freq: Four times a day (QID) | ORAL | Status: DC | PRN

## 2014-12-18 MED ORDER — ONDANSETRON 4 MG PO TBDP
8.0000 mg | ORAL_TABLET | Freq: Once | ORAL | Status: DC
  Filled 2014-12-18: qty 2

## 2014-12-18 MED ORDER — ONDANSETRON 4 MG PO TBDP
8.0000 mg | ORAL_TABLET | Freq: Once | ORAL | Status: AC
  Administered 2014-12-18: 8 mg via ORAL
  Filled 2014-12-18: qty 2

## 2014-12-18 MED ORDER — IOHEXOL 300 MG/ML  SOLN
25.0000 mL | INTRAMUSCULAR | Status: AC
  Administered 2014-12-18: 25 mL via ORAL

## 2014-12-18 MED ORDER — OXYCODONE-ACETAMINOPHEN 5-325 MG PO TABS
1.0000 | ORAL_TABLET | Freq: Once | ORAL | Status: DC
  Filled 2014-12-18: qty 1

## 2014-12-18 MED ORDER — LORAZEPAM 2 MG/ML IJ SOLN
1.0000 mg | Freq: Once | INTRAMUSCULAR | Status: AC
  Administered 2014-12-18: 1 mg via INTRAVENOUS
  Filled 2014-12-18: qty 1

## 2014-12-18 MED ORDER — HYDROMORPHONE HCL 1 MG/ML IJ SOLN
1.0000 mg | Freq: Once | INTRAMUSCULAR | Status: AC
  Administered 2014-12-18: 1 mg via INTRAVENOUS

## 2014-12-18 MED ORDER — ONDANSETRON HCL 4 MG/2ML IJ SOLN
4.0000 mg | Freq: Once | INTRAMUSCULAR | Status: AC
  Administered 2014-12-18: 4 mg via INTRAVENOUS
  Filled 2014-12-18: qty 2

## 2014-12-18 NOTE — ED Notes (Signed)
Pt given ginger ale and saltine crackers.  

## 2014-12-18 NOTE — ED Notes (Signed)
Pt had an episode of emesis.

## 2014-12-18 NOTE — ED Notes (Signed)
PA at the bedside.

## 2014-12-18 NOTE — ED Notes (Signed)
Transported pt to US

## 2014-12-18 NOTE — ED Notes (Signed)
This RN went in the pt's room to assess the pt, and the pt was not in the room.

## 2014-12-18 NOTE — ED Provider Notes (Signed)
CSN: [id number]     Arrival date & time 12/18/14  1307 History   First MD Initiated Contact with Patient 12/18/14 1329     Chief Complaint  Patient presents with  . Abdominal Pain  . Nausea     (Consider location/radiation/quality/duration/timing/severity/associated sxs/prior Treatment) Patient is a 38 y.o. female presenting with abdominal pain.  Abdominal Pain    [name] 38 year old female with a past medical history of ovarian cysts, tubal ligation, menorrhagia, and chronic severe pain with her periods. She has had workups for this in the past. The patient states that she has had her period twice this month. The most recent menstrual cycle began yesterday. She complains of severe right lower quadrant abdominal pain. She has associated upper thigh pain. She has had associated nausea and vomiting due to the severity of her pain. She states that the pain is so severe it making her tremulousness. She history of rupturing hemorrhagic cyst, but she states that this is the worst pain. She's ever felt. She is sexually active with one female partner for the past 8 years. She does not use birth control or protection as she has had a previous tubal ligation. She has had urgency to urinate without much urination. She denies hematuria, dysuria. She denies diarrhea, constipation. She has a history of multiple gynecologic surgeries. She denies a history of other abdominal surgeries.   Past Medical History  Diagnosis Date  . Bipolar 1 disorder, depressed, mild   . Migraines   . GERD (gastroesophageal reflux disease)   . Bilateral ovarian cysts   . Asthma   . Pregnancy induced hypertension   . HTN (hypertension)   . Anemia    Past Surgical History  Procedure Laterality Date  . Cesarean section    . Induced abortion    . Tubal ligation     History reviewed. No pertinent family history. History  Substance Use Topics  . Smoking status: Former Smoker -- 0.50 packs/day    Types:  Cigarettes    Quit date: 04/09/2014  . Smokeless tobacco: Never Used  . Alcohol Use: Yes     Comment: twice a year   OB History    Gravida Para Term Preterm AB TAB SAB Ectopic Multiple Living   3 2 0 1 1 1    2      Review of Systems  Gastrointestinal: Positive for abdominal pain.    Ten systems reviewed and are negative for acute change, except as noted in the HPI.    Allergies  Prednisone  Home Medications   Prior to Admission medications   Medication Sig Start Date End Date Taking? Authorizing Provider  albuterol (PROVENTIL HFA;VENTOLIN HFA) 108 (90 BASE) MCG/ACT inhaler Inhale 2 puffs into the lungs every 6 (six) hours as needed for wheezing or shortness of breath. 09/30/14  Yes Virat Prather, PA-C  diphenhydramine-acetaminophen (TYLENOL PM) 25-500 MG TABS Take 3 tablets by mouth 2 (two) times daily as needed (pain).   Yes Historical Provider, MD  acetaminophen (TYLENOL) 500 MG tablet Take 1,000 mg by mouth every 6 (six) hours as needed for mild pain or headache.    Historical Provider, MD  LORazepam (ATIVAN) 1 MG tablet Take 0.5-1 tablets (0.5-1 mg total) by mouth 3 (three) times daily as needed (muscle spasm). Patient not taking: Reported on 12/18/2014 09/30/14   Margarita Mail, PA-C  meloxicam (MOBIC) 15 MG tablet Take 1 tablet (15 mg total) by mouth daily. Take 1 daily with food. Patient not taking: Reported  on 12/18/2014 09/30/14   Margarita Mail, PA-C   BP 119/74 mmHg  Pulse 62  Temp(Src) 98.2 F (36.8 C) (Oral)  Resp 19  Ht 5' (1.524 m)  Wt 170 lb (77.111 kg)  BMI 33.20 kg/m2  SpO2 99%  LMP 12/18/2014 Physical Exam  Constitutional: She is oriented to person, place, and time. She appears well-developed and well-nourished. No distress.  Patient appears extremely uncomfortable, she is crying, her face is reddened and soft. Eyes are very swollen from crying. Tremulousness.  HENT:  Head: Normocephalic and atraumatic.  Eyes: Conjunctivae are normal. No scleral  icterus.  Neck: Normal range of motion.  Cardiovascular: Normal rate, regular rhythm and normal heart sounds.  Exam reveals no gallop and no friction rub.   No murmur heard. Pulmonary/Chest: Effort normal and breath sounds normal. No respiratory distress.  Abdominal: Soft. Bowel sounds are normal. She exhibits no distension and no mass. There is tenderness. There is rebound (exquisite tenderness to palpation). There is no guarding.  Genitourinary:  Pelvic exam: normal external genitalia, vulva, vagina, cervix, uterus and adnexa. No pain to palpation in the adnexa, no cervical motion tenderness. Bleeding from cervical os.   Neurological: She is alert and oriented to person, place, and time.  Skin: Skin is warm and dry. She is not diaphoretic.  Nursing note and vitals reviewed.   ED Course  Procedures (including critical care time) Labs Review Labs Reviewed  COMPREHENSIVE METABOLIC PANEL - Abnormal; Notable for the following:    Glucose, Bld 105 (*)    GFR calc non Af Amer 77 (*)    GFR calc Af Amer 89 (*)    All other components within normal limits  CBC WITH DIFFERENTIAL - Abnormal; Notable for the following:    Lymphs Abs 4.1 (*)    All other components within normal limits  GC/CHLAMYDIA PROBE AMP  LIPASE, BLOOD  URINALYSIS, ROUTINE W REFLEX MICROSCOPIC  HIV ANTIBODY (ROUTINE TESTING)  RPR  POC URINE PREG, ED    Imaging Review No results found.   EKG Interpretation None      MDM   Final diagnoses:  Adnexal pain    The patient here with severe right lower quadrant abdominal pain. Her pelvic examination is benign, no adnexal or cervical motion tenderness. No discharge or signs of infection. Agent will receive CT scan of the abdomen and pelvis to rule out appendicitis. Differential includes ovarian torsion, however, my suspicion for this is low. She has had continued severe abdominal pain since last night. Her pelvic examination is benign.  3:48 PM Patient's Ct is  pending. I have given sign out  To PA [facility] who will assume care of the patient. I recommend Transvaginal US to r/o torsion.   Margarita Mail, PA-C 12/24/14 [place], MD 01/01/15 [id number]

## 2014-12-18 NOTE — ED Notes (Signed)
Pt states that she is feeling better after 2 episodes of emesis.

## 2014-12-18 NOTE — ED Notes (Signed)
This RN went to have the pt ambulate, and the pt stated that she feels nauseous. Marlon Pel, [place] notified.

## 2014-12-18 NOTE — Discharge Instructions (Signed)

## 2014-12-18 NOTE — ED Notes (Signed)
Pt tearful at triage, reports severe stabbing pain to RLQ that started yesterday, more severe today. Pt having nausea, no vomiting or diarrhea.

## 2014-12-18 NOTE — ED Notes (Signed)
Patient returned from US.

## 2014-12-18 NOTE — ED Provider Notes (Signed)
3:42 PM Patient with severe RLQ abdominal pain.  Patient signed out to me by Rachel Kingfisher, PA-C.    Plan:  If CT is negative, check doppler US pelvis to rule out torsion.  All imaging studies negative.  Patient still having pain.  Will work toward pain control and plan for discharge to home with pain medications.  Results for orders placed or performed during the hospital encounter of 12/18/14  Wet prep, genital  Result Value Ref Range   Yeast Wet Prep HPF POC NONE SEEN NONE SEEN   Trich, Wet Prep NONE SEEN NONE SEEN   Clue Cells Wet Prep HPF POC NONE SEEN NONE SEEN   WBC, Wet Prep HPF POC FEW (A) NONE SEEN  Comprehensive metabolic panel  Result Value Ref Range   Sodium 136 135 - 145 mmol/L   Potassium 3.7 3.5 - 5.1 mmol/L   Chloride 105 96 - 112 mEq/L   CO2 21 19 - 32 mmol/L   Glucose, Bld 105 (H) 70 - 99 mg/dL   BUN 6 6 - 23 mg/dL   Creatinine, Ser 0.94 0.50 - 1.10 mg/dL   Calcium 9.4 8.4 - 10.5 mg/dL   Total Protein 7.1 6.0 - 8.3 g/dL   Albumin 4.2 3.5 - 5.2 g/dL   AST 20 0 - 37 U/L   ALT 13 0 - 35 U/L   Alkaline Phosphatase 57 39 - 117 U/L   Total Bilirubin 0.9 0.3 - 1.2 mg/dL   GFR calc non Af Amer 77 (L) >90 mL/min   GFR calc Af Amer 89 (L) >90 mL/min   Anion gap 10 5 - 15  CBC with Differential  Result Value Ref Range   WBC 9.9 4.0 - 10.5 K/uL   RBC 4.58 3.87 - 5.11 MIL/uL   Hemoglobin 14.0 12.0 - 15.0 g/dL   HCT 41.7 36.0 - 46.0 %   MCV 91.0 78.0 - 100.0 fL   MCH 30.6 26.0 - 34.0 pg   MCHC 33.6 30.0 - 36.0 g/dL   RDW 12.9 11.5 - 15.5 %   Platelets 381 150 - 400 K/uL   Neutrophils Relative % 48 43 - 77 %   Neutro Abs 4.6 1.7 - 7.7 K/uL   Lymphocytes Relative 41 12 - 46 %   Lymphs Abs 4.1 (H) 0.7 - 4.0 K/uL   Monocytes Relative 7 3 - 12 %   Monocytes Absolute 0.7 0.1 - 1.0 K/uL   Eosinophils Relative 4 0 - 5 %   Eosinophils Absolute 0.4 0.0 - 0.7 K/uL   Basophils Relative 0 0 - 1 %   Basophils Absolute 0.0 0.0 - 0.1 K/uL  Lipase, blood  Result Value Ref Range    Lipase 27 11 - 59 U/L  Urinalysis, Routine w reflex microscopic  Result Value Ref Range   Color, Urine YELLOW YELLOW   APPearance HAZY (A) CLEAR   Specific Gravity, Urine 1.014 1.005 - 1.030   pH 6.0 5.0 - 8.0   Glucose, UA NEGATIVE NEGATIVE mg/dL   Hgb urine dipstick LARGE (A) NEGATIVE   Bilirubin Urine NEGATIVE NEGATIVE   Ketones, ur NEGATIVE NEGATIVE mg/dL   Protein, ur NEGATIVE NEGATIVE mg/dL   Urobilinogen, UA 1.0 0.0 - 1.0 mg/dL   Nitrite NEGATIVE NEGATIVE   Leukocytes, UA TRACE (A) NEGATIVE  Urine microscopic-add on  Result Value Ref Range   Squamous Epithelial / LPF FEW (A) RARE   WBC, UA 0-2 <3 WBC/hpf   RBC / HPF TOO NUMEROUS TO COUNT <3  RBC/hpf   Bacteria, UA RARE RARE  POC Urine Pregnancy, ED  (If Pre-menopausal female) - do not order at [facility]  Result Value Ref Range   Preg Test, Ur NEGATIVE NEGATIVE   US Transvaginal Non-ob  12/18/2014   CLINICAL DATA:  Initial encounter for adnexal pain  EXAM: TRANSABDOMINAL AND TRANSVAGINAL ULTRASOUND OF PELVIS  DOPPLER ULTRASOUND OF OVARIES  TECHNIQUE: Both transabdominal and transvaginal ultrasound examinations of the pelvis were performed. Transabdominal technique was performed for global imaging of the pelvis including uterus, ovaries, adnexal regions, and pelvic cul-de-sac.  It was necessary to proceed with endovaginal exam following the transabdominal exam to visualize the ovaries. Color and duplex Doppler ultrasound was utilized to evaluate blood flow to the ovaries.  COMPARISON:  08/27/2014.  FINDINGS: Uterus  Measurements: 8.7 x 4.0 x 5.7. No fibroids or other mass visualized. The a small fibroid seen on the previous study are not evident on this exam.  Endometrium  Thickness: 15-16 mm.  No focal abnormality visualized.  Right ovary  Measurements: 4.0 x 2.7 x 3.3 cm. Normal appearance/no adnexal mass.  Left ovary  Measurements: 4.0 x 2.5 x 2.1 cm. Normal appearance/no adnexal mass.  Pulsed Doppler evaluation of both ovaries  demonstrates normal low-resistance arterial and venous waveforms.  Other findings  No free fluid.  IMPRESSION: Normal pelvic ultrasound. Specifically, no adnexal mass. No evidence for ovarian torsion.   Electronically Signed   By: Rachel Stanley M.D.   On: 12/18/2014 19:07   US Pelvis Complete  12/18/2014   CLINICAL DATA:  Initial encounter for adnexal pain  EXAM: TRANSABDOMINAL AND TRANSVAGINAL ULTRASOUND OF PELVIS  DOPPLER ULTRASOUND OF OVARIES  TECHNIQUE: Both transabdominal and transvaginal ultrasound examinations of the pelvis were performed. Transabdominal technique was performed for global imaging of the pelvis including uterus, ovaries, adnexal regions, and pelvic cul-de-sac.  It was necessary to proceed with endovaginal exam following the transabdominal exam to visualize the ovaries. Color and duplex Doppler ultrasound was utilized to evaluate blood flow to the ovaries.  COMPARISON:  08/27/2014.  FINDINGS: Uterus  Measurements: 8.7 x 4.0 x 5.7. No fibroids or other mass visualized. The a small fibroid seen on the previous study are not evident on this exam.  Endometrium  Thickness: 15-16 mm.  No focal abnormality visualized.  Right ovary  Measurements: 4.0 x 2.7 x 3.3 cm. Normal appearance/no adnexal mass.  Left ovary  Measurements: 4.0 x 2.5 x 2.1 cm. Normal appearance/no adnexal mass.  Pulsed Doppler evaluation of both ovaries demonstrates normal low-resistance arterial and venous waveforms.  Other findings  No free fluid.  IMPRESSION: Normal pelvic ultrasound. Specifically, no adnexal mass. No evidence for ovarian torsion.   Electronically Signed   By: Rachel Stanley M.D.   On: 12/18/2014 19:07   Ct Abdomen Pelvis W Contrast  12/18/2014   CLINICAL DATA:  Right lower quadrant pain and nausea since yesterday.  EXAM: CT ABDOMEN AND PELVIS WITH CONTRAST  TECHNIQUE: Multidetector CT imaging of the abdomen and pelvis was performed using the standard protocol following bolus administration of intravenous  contrast.  CONTRAST:  15mL OMNIPAQUE IOHEXOL 300 MG/ML  SOLN  COMPARISON:  Pelvic ultrasound dated 08/27/2014  FINDINGS: Mild diffuse low density of the liver relative to the spleen. Mildly dilated intrahepatic ducts without extrahepatic ductal dilatation. Unremarkable gallbladder. Normal appearing spleen, pancreas, adrenal glands, kidneys, urinary bladder, uterus and ovaries. The previously demonstrated left ovarian hemorrhagic cyst is no longer seen. Normal appearing appendix without evidence of appendicitis. No enlarged lymph nodes.  No free peritoneal fluid or air. Minimal dependent atelectasis at both lung bases. Mild lumbar and lower thoracic spine degenerative changes.  IMPRESSION: 1. Mildly dilated intrahepatic ducts. This is of doubtful clinical significance in the absence of visible gallstones or extrahepatic biliary ductal dilatation and with normal liver function tests obtained earlier today. This is most likely physiological. 2. Mild diffuse hepatic steatosis. 3. No acute abnormality.   Electronically Signed   By: Enrique Sack M.D.   On: 12/18/2014 16:00   [place] Art/ven Flow Abd Pelv Doppler  12/18/2014   CLINICAL DATA:  Initial encounter for adnexal pain  EXAM: TRANSABDOMINAL AND TRANSVAGINAL ULTRASOUND OF PELVIS  DOPPLER ULTRASOUND OF OVARIES  TECHNIQUE: Both transabdominal and transvaginal ultrasound examinations of the pelvis were performed. Transabdominal technique was performed for global imaging of the pelvis including uterus, ovaries, adnexal regions, and pelvic cul-de-sac.  It was necessary to proceed with endovaginal exam following the transabdominal exam to visualize the ovaries. Color and duplex Doppler ultrasound was utilized to evaluate blood flow to the ovaries.  COMPARISON:  08/27/2014.  FINDINGS: Uterus  Measurements: 8.7 x 4.0 x 5.7. No fibroids or other mass visualized. The a small fibroid seen on the previous study are not evident on this exam.  Endometrium  Thickness: 15-16 mm.  No  focal abnormality visualized.  Right ovary  Measurements: 4.0 x 2.7 x 3.3 cm. Normal appearance/no adnexal mass.  Left ovary  Measurements: 4.0 x 2.5 x 2.1 cm. Normal appearance/no adnexal mass.  Pulsed Doppler evaluation of both ovaries demonstrates normal low-resistance arterial and venous waveforms.  Other findings  No free fluid.  IMPRESSION: Normal pelvic ultrasound. Specifically, no adnexal mass. No evidence for ovarian torsion.   Electronically Signed   By: Rachel Stanley M.D.   On: 12/18/2014 19:07     8:18 PM Imaging is negative, patient feels better after another round of pain medicine. Plan for discharge to home with OB/GYN follow-up. Patient requests new OB/GYN. Resources provided. Discharge with some pain medicine. Patient is stable and ready for discharge. She is frustrated, that no specific diagnosis found, but understands that she is stable for outpatient workup.   Montine Circle, PA-C 12/18/14 2019  Quintella Reichert, MD 12/18/14 2124

## 2014-12-21 LAB — RPR

## 2014-12-21 LAB — GC/CHLAMYDIA PROBE AMP
CT Probe RNA: NEGATIVE
GC Probe RNA: NEGATIVE

## 2014-12-21 LAB — HIV ANTIBODY (ROUTINE TESTING W REFLEX): HIV 1&2 Ab, 4th Generation: NONREACTIVE

## 2015-03-20 ENCOUNTER — Inpatient Hospital Stay (HOSPITAL_COMMUNITY)
Admission: EM | Admit: 2015-03-20 | Discharge: 2015-03-21 | DRG: 203 | Disposition: A | Discharge status: Home / Self Care | Payer: Self-pay | Attending: Internal Medicine | Admitting: Internal Medicine

## 2015-03-20 ENCOUNTER — Emergency Department (HOSPITAL_COMMUNITY): Payer: Self-pay

## 2015-03-20 ENCOUNTER — Encounter (HOSPITAL_COMMUNITY): Payer: Self-pay | Admitting: Emergency Medicine

## 2015-03-20 DIAGNOSIS — J45901 Unspecified asthma with (acute) exacerbation: Principal | ICD-10-CM | POA: Diagnosis present

## 2015-03-20 DIAGNOSIS — K219 Gastro-esophageal reflux disease without esophagitis: Secondary | ICD-10-CM | POA: Diagnosis present

## 2015-03-20 DIAGNOSIS — Z87891 Personal history of nicotine dependence: Secondary | ICD-10-CM

## 2015-03-20 DIAGNOSIS — E876 Hypokalemia: Secondary | ICD-10-CM | POA: Diagnosis present

## 2015-03-20 DIAGNOSIS — F319 Bipolar disorder, unspecified: Secondary | ICD-10-CM | POA: Diagnosis present

## 2015-03-20 DIAGNOSIS — Z79899 Other long term (current) drug therapy: Secondary | ICD-10-CM

## 2015-03-20 DIAGNOSIS — J209 Acute bronchitis, unspecified: Secondary | ICD-10-CM | POA: Diagnosis present

## 2015-03-20 DIAGNOSIS — I10 Essential (primary) hypertension: Secondary | ICD-10-CM | POA: Diagnosis present

## 2015-03-20 LAB — I-STAT CHEM 8, ED
BUN: 10 mg/dL (ref 6–23)
Calcium, Ion: 1.15 mmol/L (ref 1.12–1.23)
Chloride: 101 mmol/L (ref 96–112)
Creatinine, Ser: 0.9 mg/dL (ref 0.50–1.10)
Glucose, Bld: 109 mg/dL — ABNORMAL HIGH (ref 70–99)
HCT: 39 % (ref 36.0–46.0)
HEMOGLOBIN: 13.3 g/dL (ref 12.0–15.0)
Potassium: 3.1 mmol/L — ABNORMAL LOW (ref 3.5–5.1)
Sodium: 142 mmol/L (ref 135–145)
TCO2: 22 mmol/L (ref 0–100)

## 2015-03-20 MED ORDER — SODIUM CHLORIDE 0.9 % IV SOLN
Freq: Once | INTRAVENOUS | Status: AC
Start: 2015-03-20 — End: 2015-03-21
  Administered 2015-03-21: 01:00:00 via INTRAVENOUS

## 2015-03-20 MED ORDER — ALBUTEROL (5 MG/ML) CONTINUOUS INHALATION SOLN
15.0000 mg | INHALATION_SOLUTION | Freq: Once | RESPIRATORY_TRACT | Status: AC
  Administered 2015-03-20: 15 mg via RESPIRATORY_TRACT

## 2015-03-20 MED ORDER — METHYLPREDNISOLONE SODIUM SUCC 125 MG IJ SOLR
125.0000 mg | Freq: Once | INTRAMUSCULAR | Status: AC
  Administered 2015-03-20: 125 mg via INTRAVENOUS
  Filled 2015-03-20: qty 2

## 2015-03-20 MED ORDER — IPRATROPIUM-ALBUTEROL 0.5-2.5 (3) MG/3ML IN SOLN
3.0000 mL | Freq: Once | RESPIRATORY_TRACT | Status: AC
  Administered 2015-03-20: 3 mL via RESPIRATORY_TRACT
  Filled 2015-03-20: qty 3

## 2015-03-20 NOTE — ED Notes (Signed)
Pt reports SOB since 1700 today.  Has hx of asthma and has been using her inhaler without relief.  Pt also reports coughing up yellowish sputum without a known fever.  Pt is A&Ox 4, in NAD.

## 2015-03-20 NOTE — ED Provider Notes (Signed)
CSN: [id number]     Arrival date & time 03/20/15  2019 History  This chart was scribed for non-physician practitioner Junius Creamer, PA-C working with Quintella Reichert, MD by Rayfield Citizen, ED Scribe. This patient was seen in room WTR1/WLPT1 and the patient's care was started at 10:04 PM.   Chief Complaint  Patient presents with  . Asthma   Patient is a 39 y.o. female presenting with asthma. The history is provided by the patient.  Asthma Associated symptoms include headaches and shortness of breath.     HPI Comments: [name] is a 39 y.o. female with past medical history of asthma who presents to the Emergency Department complaining of several days of SOB, acutely worsening at 17:00 tonight. She states she has used her albuterol inhaler at home without relief. She reports recent cough and cold symptoms, including several days of cough and headache.   Patient has been hospitalized for asthma exacerbation once each year for the past 3 years.    Last round of steroids was "some time ago." She denies possibility of pregnancy.   Past Medical History  Diagnosis Date  . Bipolar 1 disorder, depressed, mild   . Migraines   . GERD (gastroesophageal reflux disease)   . Bilateral ovarian cysts   . Asthma   . Pregnancy induced hypertension   . HTN (hypertension)   . Anemia    Past Surgical History  Procedure Laterality Date  . Cesarean section    . Induced abortion    . Tubal ligation     History reviewed. No pertinent family history. History  Substance Use Topics  . Smoking status: Former Smoker -- 0.50 packs/day    Types: Cigarettes    Quit date: 04/09/2014  . Smokeless tobacco: Never Used  . Alcohol Use: Yes     Comment: twice a year   OB History    Gravida Para Term Preterm AB TAB SAB Ectopic Multiple Living   3 2 0 1 1 1    2      Review of Systems  Respiratory: Positive for cough and shortness of breath.   Neurological: Positive for headaches.      Allergies   Prednisone  Home Medications   Prior to Admission medications   Medication Sig Start Date End Date Taking? Authorizing Provider  albuterol (PROVENTIL HFA;VENTOLIN HFA) 108 (90 BASE) MCG/ACT inhaler Inhale 2 puffs into the lungs every 6 (six) hours as needed for wheezing or shortness of breath. 09/30/14  Yes Abigail Harris, PA-C  diphenhydramine-acetaminophen (TYLENOL PM) 25-500 MG TABS Take 3 tablets by mouth 2 (two) times daily as needed (pain).   Yes Historical Provider, MD  acetaminophen (TYLENOL) 500 MG tablet Take 1,000 mg by mouth every 6 (six) hours as needed for mild pain or headache.    Historical Provider, MD  LORazepam (ATIVAN) 1 MG tablet Take 0.5-1 tablets (0.5-1 mg total) by mouth 3 (three) times daily as needed (muscle spasm). Patient not taking: Reported on 12/18/2014 09/30/14   Margarita Mail, PA-C  meloxicam (MOBIC) 15 MG tablet Take 1 tablet (15 mg total) by mouth daily. Take 1 daily with food. Patient not taking: Reported on 12/18/2014 09/30/14   Margarita Mail, PA-C  oxyCODONE-acetaminophen (PERCOCET/ROXICET) 5-325 MG per tablet Take 2 tablets by mouth every 6 (six) hours as needed for severe pain. Patient not taking: Reported on 03/20/2015 12/18/14   Montine Circle, PA-C   BP 140/83 mmHg  Pulse 95  Temp(Src) 98.3 F (36.8 C) (Oral)  Resp  24  SpO2 95%  LMP 02/26/2015 Physical Exam  Constitutional: She is oriented to person, place, and time. She appears well-developed and well-nourished.  HENT:  Head: Normocephalic and atraumatic.  Right Ear: External ear normal.  Left Ear: External ear normal.  Mouth/Throat: Oropharynx is clear and moist. No oropharyngeal exudate.  Neck: No tracheal deviation present.  Cardiovascular: Normal rate.   Pulmonary/Chest: Effort normal. She has wheezes.  Decreased air movement  Neurological: She is alert and oriented to person, place, and time.  Skin: Skin is warm and dry.  Psychiatric: She has a normal mood and affect. Her  behavior is normal.  Nursing note and vitals reviewed.   ED Course  CRITICAL CARE Performed by: Junius Creamer Authorized by: Junius Creamer Total critical care time: 90 minutes Critical care was necessary to treat or prevent imminent or life-threatening deterioration of the following conditions: asthma exacerbation. Critical care was time spent personally by me on the following activities: development of treatment plan with patient or surrogate, evaluation of patient's response to treatment, examination of patient, obtaining history from patient or surrogate, ordering and performing treatments and interventions, ordering and review of laboratory studies, ordering and review of radiographic studies, pulse oximetry and re-evaluation of patient's condition. Subsequent provider of critical care: I assumed direction of critical care for this patient from another provider of my specialty.     DIAGNOSTIC STUDIES: Oxygen Saturation is 100% on RA, normal by my interpretation.    COORDINATION OF CARE: 10:07 PM Discussed treatment plan with pt at bedside and pt agreed to plan.   Labs Review Labs Reviewed  CBC WITH DIFFERENTIAL/PLATELET - Abnormal; Notable for the following:    Eosinophils Relative 6 (*)    All other components within normal limits  I-STAT CHEM 8, ED - Abnormal; Notable for the following:    Potassium 3.1 (*)    Glucose, Bld 109 (*)    All other components within normal limits    Imaging Review Dg Chest 2 View (if Patient Has Fever And/or Copd)  03/20/2015   CLINICAL DATA:  Acute onset shortness of breath.  Asthma.  EXAM: CHEST  2 VIEW  COMPARISON:  09/30/2014  FINDINGS: The heart size and mediastinal contours are within normal limits. Both lungs are clear. The visualized skeletal structures are unremarkable.  IMPRESSION: Negative.  No active cardiopulmonary disease.   Electronically Signed   By: Earle Gell M.D.   On: 03/20/2015 21:30     EKG Interpretation None    patient  has been given IV fluids.  She has been giving 2 g of neck.  Sulfate.  She's been given 125 mg of Solu-Medrol.  She's been given 2 albuterol treatments 5 mg each +15 mg continuous nebulizer and is still having shortness of breath and wheezing.  Her workup breathing has significantly decreased, but she is still to At 24 breaths per minute.  Her oxygen saturation has improved as between 95 and 97%.  Patient states she is allergic to the preservative an oral prednisone and cannot take this, which makes me concerned for sending her home at this point, I think she would benefit with an observation admission so that she can get additional IV steroids.  She does have a history of being hospitalized into the intensive care unit on 2 previous occasions for asthma exacerbation  MDM   Final diagnoses:  Asthma exacerbation    I personally performed the services described in this documentation, which was scribed in my presence. The recorded  information has been reviewed and is accurate.     Junius Creamer, NP 03/21/15 [place], NP 03/21/15 [place], MD 03/21/15 1723

## 2015-03-20 NOTE — ED Notes (Signed)
Pt reports she has been SOB since 5pm today. Pt has hx of asthma and used inhaler with no relief.

## 2015-03-21 ENCOUNTER — Encounter (HOSPITAL_COMMUNITY): Payer: Self-pay | Admitting: Internal Medicine

## 2015-03-21 DIAGNOSIS — J45901 Unspecified asthma with (acute) exacerbation: Principal | ICD-10-CM

## 2015-03-21 DIAGNOSIS — E876 Hypokalemia: Secondary | ICD-10-CM

## 2015-03-21 DIAGNOSIS — J209 Acute bronchitis, unspecified: Secondary | ICD-10-CM | POA: Insufficient documentation

## 2015-03-21 LAB — CBC WITH DIFFERENTIAL/PLATELET
BASOS ABS: 0.1 10*3/uL (ref 0.0–0.1)
Basophils Relative: 1 % (ref 0–1)
Eosinophils Absolute: 0.6 10*3/uL (ref 0.0–0.7)
Eosinophils Relative: 6 % — ABNORMAL HIGH (ref 0–5)
HCT: 38.3 % (ref 36.0–46.0)
HEMOGLOBIN: 12.7 g/dL (ref 12.0–15.0)
Lymphocytes Relative: 38 % (ref 12–46)
Lymphs Abs: 3.8 10*3/uL (ref 0.7–4.0)
MCH: 31.5 pg (ref 26.0–34.0)
MCHC: 33.2 g/dL (ref 30.0–36.0)
MCV: 95 fL (ref 78.0–100.0)
MONO ABS: 0.6 10*3/uL (ref 0.1–1.0)
Monocytes Relative: 6 % (ref 3–12)
Neutro Abs: 5 10*3/uL (ref 1.7–7.7)
Neutrophils Relative %: 49 % (ref 43–77)
PLATELETS: 325 10*3/uL (ref 150–400)
RBC: 4.03 MIL/uL (ref 3.87–5.11)
RDW: 12.8 % (ref 11.5–15.5)
WBC: 10.1 10*3/uL (ref 4.0–10.5)

## 2015-03-21 LAB — CBC
HEMATOCRIT: 37.9 % (ref 36.0–46.0)
Hemoglobin: 12.7 g/dL (ref 12.0–15.0)
MCH: 31.7 pg (ref 26.0–34.0)
MCHC: 33.5 g/dL (ref 30.0–36.0)
MCV: 94.5 fL (ref 78.0–100.0)
Platelets: 328 10*3/uL (ref 150–400)
RBC: 4.01 MIL/uL (ref 3.87–5.11)
RDW: 12.8 % (ref 11.5–15.5)
WBC: 9.5 10*3/uL (ref 4.0–10.5)

## 2015-03-21 LAB — INFLUENZA PANEL BY PCR (TYPE A & B)
H1N1 flu by pcr: NOT DETECTED
INFLAPCR: NEGATIVE
Influenza B By PCR: NEGATIVE

## 2015-03-21 LAB — CREATININE, SERUM
Creatinine, Ser: 0.76 mg/dL (ref 0.50–1.10)
GFR calc Af Amer: >90 mL/min (ref >90)
GFR calc non Af Amer: >90 mL/min (ref >90)

## 2015-03-21 MED ORDER — ENOXAPARIN SODIUM 40 MG/0.4ML ~~LOC~~ SOLN
40.0000 mg | SUBCUTANEOUS | Status: DC
  Administered 2015-03-21: 40 mg via SUBCUTANEOUS
  Filled 2015-03-21: qty 0.4

## 2015-03-21 MED ORDER — ALBUTEROL SULFATE (2.5 MG/3ML) 0.083% IN NEBU
5.0000 mg | INHALATION_SOLUTION | Freq: Once | RESPIRATORY_TRACT | Status: AC
  Administered 2015-03-21: 5 mg via RESPIRATORY_TRACT
  Filled 2015-03-21: qty 6

## 2015-03-21 MED ORDER — SODIUM CHLORIDE 0.9 % IJ SOLN
3.0000 mL | Freq: Two times a day (BID) | INTRAMUSCULAR | Status: DC
  Administered 2015-03-21: 3 mL via INTRAVENOUS

## 2015-03-21 MED ORDER — DEXTROSE 5 % IV SOLN
500.0000 mg | Freq: Every day | INTRAVENOUS | Status: DC
  Administered 2015-03-21: 500 mg via INTRAVENOUS
  Filled 2015-03-21: qty 500

## 2015-03-21 MED ORDER — METHYLPREDNISOLONE SODIUM SUCC 40 MG IJ SOLR
40.0000 mg | Freq: Every day | INTRAMUSCULAR | Status: DC
  Filled 2015-03-21: qty 1

## 2015-03-21 MED ORDER — POTASSIUM CHLORIDE CRYS ER 20 MEQ PO TBCR
40.0000 meq | EXTENDED_RELEASE_TABLET | Freq: Once | ORAL | Status: AC
  Administered 2015-03-21: 40 meq via ORAL
  Filled 2015-03-21: qty 2

## 2015-03-21 MED ORDER — ACETAMINOPHEN 325 MG PO TABS
650.0000 mg | ORAL_TABLET | Freq: Once | ORAL | Status: AC
  Administered 2015-03-21: 650 mg via ORAL
  Filled 2015-03-21: qty 2

## 2015-03-21 MED ORDER — ACETAMINOPHEN 650 MG RE SUPP: 650.0000 mg | Freq: Four times a day (QID) | RECTAL | Status: DC | PRN

## 2015-03-21 MED ORDER — ONDANSETRON HCL 4 MG/2ML IJ SOLN: 4.0000 mg | Freq: Four times a day (QID) | INTRAMUSCULAR | Status: DC | PRN

## 2015-03-21 MED ORDER — AZITHROMYCIN 500 MG PO TABS: 500.0000 mg | ORAL_TABLET | Freq: Every day | ORAL | Status: DC

## 2015-03-21 MED ORDER — ACETAMINOPHEN 325 MG PO TABS: 650.0000 mg | ORAL_TABLET | Freq: Four times a day (QID) | ORAL | Status: DC | PRN

## 2015-03-21 MED ORDER — ALBUTEROL SULFATE HFA 108 (90 BASE) MCG/ACT IN AERS
2.0000 | INHALATION_SPRAY | Freq: Four times a day (QID) | RESPIRATORY_TRACT | Status: DC | PRN
Start: 2015-03-21 — End: 2015-03-26

## 2015-03-21 MED ORDER — ONDANSETRON HCL 4 MG PO TABS: 4.0000 mg | ORAL_TABLET | Freq: Four times a day (QID) | ORAL | Status: DC | PRN

## 2015-03-21 MED ORDER — PREDNISONE 50 MG PO TABS: ORAL_TABLET | ORAL | Status: DC

## 2015-03-21 MED ORDER — ALBUTEROL SULFATE (2.5 MG/3ML) 0.083% IN NEBU: 2.5000 mg | INHALATION_SOLUTION | RESPIRATORY_TRACT | Status: DC | PRN

## 2015-03-21 MED ORDER — ALBUTEROL SULFATE (2.5 MG/3ML) 0.083% IN NEBU
2.5000 mg | INHALATION_SOLUTION | RESPIRATORY_TRACT | Status: DC
  Administered 2015-03-21 (×2): 2.5 mg via RESPIRATORY_TRACT
  Filled 2015-03-21 (×2): qty 3

## 2015-03-21 MED ORDER — GUAIFENESIN 100 MG/5ML PO SOLN
200.0000 mg | ORAL | Status: DC | PRN
  Administered 2015-03-21: 200 mg via ORAL
  Filled 2015-03-21: qty 20

## 2015-03-21 MED ORDER — BUDESONIDE 0.25 MG/2ML IN SUSP
0.2500 mg | Freq: Two times a day (BID) | RESPIRATORY_TRACT | Status: DC
  Administered 2015-03-21: 0.25 mg via RESPIRATORY_TRACT
  Filled 2015-03-21: qty 2

## 2015-03-21 MED ORDER — MAGNESIUM SULFATE 2 GM/50ML IV SOLN
2.0000 g | Freq: Once | INTRAVENOUS | Status: AC
  Administered 2015-03-21: 2 g via INTRAVENOUS
  Filled 2015-03-21: qty 50

## 2015-03-21 NOTE — H&P (Signed)
[facility] History and Physical  [name] QIH:[id number] DOB: [birth year] DOA: 03/20/2015  Referring physician: ER physician. PCP: Lorayne Marek, MD   Chief Complaint: Shortness of breath.  HPI: [name] is a 39 y.o. female history of asthma and has been experiencing increasing shortness of breath over the last 2-3 days with productive cough. Denies any fever chills or chest pain. Despite using her albuterol at home patient has been still short of breath and she came to the ER. In the ER patient was given multiple doses of nebulizer and since patient was still wheezing patient has been admitted for further management of asthma exacerbation. Patient denies any recent travel or sick contacts.   Review of Systems: As presented in the history of presenting illness, rest negative.  Past Medical History  Diagnosis Date  . Bipolar 1 disorder, depressed, mild   . Migraines   . GERD (gastroesophageal reflux disease)   . Bilateral ovarian cysts   . Asthma   . Pregnancy induced hypertension   . HTN (hypertension)   . Anemia    Past Surgical History  Procedure Laterality Date  . Cesarean section    . Induced abortion    . Tubal ligation     Social History:  reports that she quit smoking about a year ago. Her smoking use included Cigarettes. She smoked 0.50 packs per day. She has never used smokeless tobacco. She reports that she drinks alcohol. She reports that she does not use illicit drugs. Where does patient live home. Can patient participate in ADLs? Yes.  Allergies  Allergen Reactions  . Prednisone Swelling and Rash    Leg swells up; pt reports I.V use with no problems    Family History:  Family History  Problem Relation Age of Onset  . Asthma Mother   . Asthma Brother       Prior to Admission medications   Medication Sig Start Date End Date Taking? Authorizing Provider  albuterol (PROVENTIL HFA;VENTOLIN HFA) 108 (90 BASE) MCG/ACT inhaler Inhale 2  puffs into the lungs every 6 (six) hours as needed for wheezing or shortness of breath. 09/30/14  Yes Abigail Harris, PA-C  diphenhydramine-acetaminophen (TYLENOL PM) 25-500 MG TABS Take 3 tablets by mouth 2 (two) times daily as needed (pain).   Yes Historical Provider, MD  acetaminophen (TYLENOL) 500 MG tablet Take 1,000 mg by mouth every 6 (six) hours as needed for mild pain or headache.    Historical Provider, MD  LORazepam (ATIVAN) 1 MG tablet Take 0.5-1 tablets (0.5-1 mg total) by mouth 3 (three) times daily as needed (muscle spasm). Patient not taking: Reported on 12/18/2014 09/30/14   Margarita Mail, PA-C  meloxicam (MOBIC) 15 MG tablet Take 1 tablet (15 mg total) by mouth daily. Take 1 daily with food. Patient not taking: Reported on 12/18/2014 09/30/14   Margarita Mail, PA-C  oxyCODONE-acetaminophen (PERCOCET/ROXICET) 5-325 MG per tablet Take 2 tablets by mouth every 6 (six) hours as needed for severe pain. Patient not taking: Reported on 03/20/2015 12/18/14   Montine Circle, PA-C    Physical Exam: Filed Vitals:   03/21/15 0230 03/21/15 0300 03/21/15 0324 03/21/15 0555  BP: 133/83 126/78 131/58 113/68  Pulse: 98 110 102 107  Temp:   98.9 F (37.2 C) 98.1 F (36.7 C)  TempSrc:   Oral Oral  Resp:   18 18  Height:   5' (1.524 m)   Weight:   77.111 kg (170 lb)   SpO2: 97% 93% 99% 99%  General:  Obese not in distress.  Eyes: Anicteric no pallor.  ENT: No discharge from the ears eyes nose and mouth.  Neck: No mass felt.  Cardiovascular: S1-S2 heard.  Respiratory: Bilateral expiratory wheezes heard. No crepitations.  Abdomen: Soft nontender bowel sounds present.  Skin: No rash.  Musculoskeletal: No edema.  Psychiatric: Appears normal.  Neurologic: Alert awake oriented to time place and person. Moves all extremities.  Labs on Admission:  Basic Metabolic Panel:  Recent Labs Lab 03/20/15 2354  NA 142  K 3.1*  CL 101  GLUCOSE 109*  BUN 10  CREATININE 0.90    Liver Function Tests: No results for input(s): AST, ALT, ALKPHOS, BILITOT, PROT, ALBUMIN in the last 168 hours. No results for input(s): LIPASE, AMYLASE in the last 168 hours. No results for input(s): AMMONIA in the last 168 hours. CBC:  Recent Labs Lab 03/20/15 2343 03/20/15 2354  WBC 10.1  --   NEUTROABS 5.0  --   HGB 12.7 13.3  HCT 38.3 39.0  MCV 95.0  --   PLT 325  --    Cardiac Enzymes: No results for input(s): CKTOTAL, CKMB, CKMBINDEX, TROPONINI in the last 168 hours.  BNP (last 3 results) No results for input(s): BNP in the last 8760 hours.  ProBNP (last 3 results) No results for input(s): PROBNP in the last 8760 hours.  CBG: No results for input(s): GLUCAP in the last 168 hours.  Radiological Exams on Admission: Dg Chest 2 View (if Patient Has Fever And/or Copd)  03/20/2015   CLINICAL DATA:  Acute onset shortness of breath.  Asthma.  EXAM: CHEST  2 VIEW  COMPARISON:  09/30/2014  FINDINGS: The heart size and mediastinal contours are within normal limits. Both lungs are clear. The visualized skeletal structures are unremarkable.  IMPRESSION: Negative.  No active cardiopulmonary disease.   Electronically Signed   By: Earle Gell M.D.   On: 03/20/2015 21:30     Assessment/Plan Principal Problem:   Asthma exacerbation   1. Asthma exacerbation - patient has been placed on IV Solu-Medrol nebulizer and Zithromax along with Pulmicort. Check influenza PCR. 2. Tobacco abuse - patient quit smoking 6 months ago. 3. History of hypertension - patient states since he quit smoking 6 months ago her blood pressure has been in the normal range. Presently she is not taking any medications. Closely follow blood pressure trends.   DVT Prophylaxis Lovenox.  Code Status: Full code.  Family Communication: None.  Disposition Plan: Admit to inpatient.    KAKRAKANDY,ARSHAD N. [facility] Pager [phone].  If 7PM-7AM, please contact night-coverage [email or web address] Password  [facility] 03/21/2015, 6:31 AM

## 2015-03-21 NOTE — Discharge Summary (Signed)
Physician Discharge Summary  TANECIA MCCAY ESP:[id number] DOB: [birth year] DOA: 03/20/2015  PCP: Lorayne Marek, MD  Admit date: 03/20/2015 Discharge date: 03/21/2015  Recommendations for Outpatient Follow-up:  1. Pt will follow up with PCP per scheduled appt   Discharge Diagnoses:  Principal Problem:   Asthma exacerbation    Discharge Condition: stable   Diet recommendation: as tolerated   History of present illness:  39 y.o. female history of asthma who presented with worsening shortness of breath and cough over past couple of days PTA. She used albuterol inhaler with no significant symptomatic relief. In the ER patient was given multiple doses of nebulizer and since patient was still wheezing patient has been admitted for further management of asthma exacerbation.  Hospital Course:   Principal Problem:   Asthma exacerbation - Continue prednisone for 5 days on discharge and then stop - no wheezing on physical exam today - use albuterol inhaler as needed  Acute bronchitis - Azithromycin as prescribed - cough improving   Hypokalemia  - Due to nebs given - Supplemented    Signed:  Leisa Lenz, MD  [facility] 03/21/2015, 2:55 PM  Pager #: [phone]   Discharge Exam: Filed Vitals:   03/21/15 0555  BP: 113/68  Pulse: 107  Temp: 98.1 F (36.7 C)  Resp: 18   Filed Vitals:   03/21/15 0300 03/21/15 0324 03/21/15 0555 03/21/15 0851  BP: 126/78 131/58 113/68   Pulse: 110 102 107   Temp:  98.9 F (37.2 C) 98.1 F (36.7 C)   TempSrc:  Oral Oral   Resp:  18 18   Height:  5' (1.524 m)    Weight:  77.111 kg (170 lb)    SpO2: 93% 99% 99% 97%    General: Pt is alert, follows commands appropriately, not in acute distress Cardiovascular: Regular rate and rhythm, S1/S2 +, no murmurs Respiratory: Clear to auscultation bilaterally, no wheezing, no crackles, no rhonchi Abdominal: Soft, non tender, non distended, bowel sounds +, no guarding Extremities: no  edema, no cyanosis, pulses palpable bilaterally DP and PT Neuro: Grossly nonfocal  Discharge Instructions  Discharge Instructions    Call MD for:  difficulty breathing, headache or visual disturbances    Complete by:  As directed      Call MD for:  persistant nausea and vomiting    Complete by:  As directed      Call MD for:  severe uncontrolled pain    Complete by:  As directed      Diet - low sodium heart healthy    Complete by:  As directed      Increase activity slowly    Complete by:  As directed             Medication List    STOP taking these medications        LORazepam 1 MG tablet  Commonly known as:  ATIVAN     meloxicam 15 MG tablet  Commonly known as:  MOBIC     oxyCODONE-acetaminophen 5-325 MG per tablet  Commonly known as:  PERCOCET/ROXICET      TAKE these medications        acetaminophen 500 MG tablet  Commonly known as:  TYLENOL  Take 1,000 mg by mouth every 6 (six) hours as needed for mild pain or headache.     albuterol 108 (90 BASE) MCG/ACT inhaler  Commonly known as:  PROVENTIL HFA;VENTOLIN HFA  Inhale 2 puffs into the lungs every 6 (six) hours as  needed for wheezing or shortness of breath.     azithromycin 500 MG tablet  Commonly known as:  ZITHROMAX  Take 1 tablet (500 mg total) by mouth daily.     diphenhydramine-acetaminophen 25-500 MG Tabs  Commonly known as:  TYLENOL PM  Take 3 tablets by mouth 2 (two) times daily as needed (pain).     predniSONE 50 MG tablet  Commonly known as:  DELTASONE  Take prednisone 50 mg daily for 5 days.           Follow-up Information    Follow up with Lorayne Marek, MD. Schedule an appointment as soon as possible for a visit in 1 week.   Specialty:  Internal Medicine   Why:  Follow up appt after recent hospitalization   Contact information:   [place] [address] [phone]        The results of significant diagnostics from this hospitalization (including imaging,  microbiology, ancillary and laboratory) are listed below for reference.    Significant Diagnostic Studies: Dg Chest 2 View (if Patient Has Fever And/or Copd)  03/20/2015   CLINICAL DATA:  Acute onset shortness of breath.  Asthma.  EXAM: CHEST  2 VIEW  COMPARISON:  09/30/2014  FINDINGS: The heart size and mediastinal contours are within normal limits. Both lungs are clear. The visualized skeletal structures are unremarkable.  IMPRESSION: Negative.  No active cardiopulmonary disease.   Electronically Signed   By: Earle Gell M.D.   On: 03/20/2015 21:30    Microbiology: No results found for this or any previous visit (from the past 240 hour(s)).   Labs: Basic Metabolic Panel:  Recent Labs Lab 03/20/15 2354 03/21/15 0650  NA 142  --   K 3.1*  --   CL 101  --   GLUCOSE 109*  --   BUN 10  --   CREATININE 0.90 0.76   Liver Function Tests: No results for input(s): AST, ALT, ALKPHOS, BILITOT, PROT, ALBUMIN in the last 168 hours. No results for input(s): LIPASE, AMYLASE in the last 168 hours. No results for input(s): AMMONIA in the last 168 hours. CBC:  Recent Labs Lab 03/20/15 2343 03/20/15 2354 03/21/15 0650  WBC 10.1  --  9.5  NEUTROABS 5.0  --   --   HGB 12.7 13.3 12.7  HCT 38.3 39.0 37.9  MCV 95.0  --  94.5  PLT 325  --  328   Cardiac Enzymes: No results for input(s): CKTOTAL, CKMB, CKMBINDEX, TROPONINI in the last 168 hours. BNP: BNP (last 3 results) No results for input(s): BNP in the last 8760 hours.  ProBNP (last 3 results) No results for input(s): PROBNP in the last 8760 hours.  CBG: No results for input(s): GLUCAP in the last 168 hours.  Time coordinating discharge: Over 30 minutes

## 2015-03-21 NOTE — Discharge Instructions (Signed)
Asthma, Acute Bronchospasm °Acute bronchospasm caused by asthma is also referred to as an asthma attack. Bronchospasm means your air passages become narrowed. The narrowing is caused by inflammation and tightening of the muscles in the air tubes (bronchi) in your lungs. This can make it hard to breathe or cause you to wheeze and cough. °CAUSES °Possible triggers are: °· Animal dander from the skin, hair, or feathers of animals. °· Dust mites contained in house dust. °· Cockroaches. °· Pollen from trees or grass. °· Mold. °· Cigarette or tobacco smoke. °· Air pollutants such as dust, household cleaners, hair sprays, aerosol sprays, paint fumes, strong chemicals, or strong odors. °· Cold air or weather changes. Cold air may trigger inflammation. Winds increase molds and pollens in the air. °· Strong emotions such as crying or laughing hard. °· Stress. °· Certain medicines such as aspirin or beta-blockers. °· Sulfites in foods and drinks, such as dried fruits and wine. °· Infections or inflammatory conditions, such as a flu, cold, or inflammation of the nasal membranes (rhinitis). °· Gastroesophageal reflux disease (GERD). GERD is a condition where stomach acid backs up into your esophagus. °· Exercise or strenuous activity. °SIGNS AND SYMPTOMS  °· Wheezing. °· Excessive coughing, particularly at night. °· Chest tightness. °· Shortness of breath. °DIAGNOSIS  °Your health care provider will ask you about your medical history and perform a physical exam. A chest X-ray or blood testing may be performed to look for other causes of your symptoms or other conditions that may have triggered your asthma attack.  °TREATMENT  °Treatment is aimed at reducing inflammation and opening up the airways in your lungs.  Most asthma attacks are treated with inhaled medicines. These include quick relief or rescue medicines (such as bronchodilators) and controller medicines (such as inhaled corticosteroids). These medicines are sometimes  given through an inhaler or a nebulizer. Systemic steroid medicine taken by mouth or given through an IV tube also can be used to reduce the inflammation when an attack is moderate or severe. Antibiotic medicines are only used if a bacterial infection is present.  °HOME CARE INSTRUCTIONS  °· Rest. °· Drink plenty of liquids. This helps the mucus to remain thin and be easily coughed up. Only use caffeine in moderation and do not use alcohol until you have recovered from your illness. °· Do not smoke. Avoid being exposed to secondhand smoke. °· You play a critical role in keeping yourself in good health. Avoid exposure to things that cause you to wheeze or to have breathing problems. °· Keep your medicines up-to-date and available. Carefully follow your health care provider's treatment plan. °· Take your medicine exactly as prescribed. °· When pollen or pollution is bad, keep windows closed and use an air conditioner or go to places with air conditioning. °· Asthma requires careful medical care. See your health care provider for a follow-up as advised. If you are more than [redacted] weeks pregnant and you were prescribed any new medicines, let your obstetrician know about the visit and how you are doing. Follow up with your health care provider as directed. °· After you have recovered from your asthma attack, make an appointment with your outpatient doctor to talk about ways to reduce the likelihood of future attacks. If you do not have a doctor who manages your asthma, make an appointment with a primary care doctor to discuss your asthma. °SEEK IMMEDIATE MEDICAL CARE IF:  °· You are getting worse. °· You have trouble breathing. If severe, call your local   emergency services (911 in the [place]  You develop chest pain or discomfort.  You are vomiting.  You are not able to keep fluids down.  You are coughing up yellow, green, brown, or bloody sputum.  You have a fever and your symptoms suddenly get worse.  You have  trouble swallowing. MAKE SURE YOU:   Understand these instructions.  Will watch your condition.  Will get help right away if you are not doing well or get worse. Document Released: 03/22/2007 Document Revised: 12/10/2013 Document Reviewed: 06/12/2013 [facility] Patient Information 2015 [place], [place]. This information is not intended to replace advice given to you by your health care provider. Make sure you discuss any questions you have with your health care provider.  Asthma Asthma is a condition of the lungs in which the airways tighten and narrow. Asthma can make it hard to breathe. Asthma cannot be cured, but medicine and lifestyle changes can help control it. Asthma may be started (triggered) by:  Animal skin flakes (dander).  Dust.  Cockroaches.  Pollen.  Mold.  Smoke.  Cleaning products.  Hair sprays or aerosol sprays.  Paint fumes or strong smells.  Cold air, weather changes, and winds.  Crying or laughing hard.  Stress.  Certain medicines or drugs.  Foods, such as dried fruit, potato chips, and sparkling grape juice.  Infections or conditions (colds, flu).  Exercise.  Certain medical conditions or diseases.  Exercise or tiring activities. HOME CARE   Take medicine as told by your doctor.  Use a peak flow meter as told by your doctor. A peak flow meter is a tool that measures how well the lungs are working.  Record and keep track of the peak flow meter's readings.  Understand and use the asthma action plan. An asthma action plan is a written plan for taking care of your asthma and treating your attacks.  To help prevent asthma attacks:  Do not smoke. Stay away from secondhand smoke.  Change your heating and air conditioning filter often.  Limit your use of fireplaces and wood stoves.  Get rid of pests (such as roaches and mice) and their droppings.  Throw away plants if you see mold on them.  Clean your floors. Dust regularly. Use  cleaning products that do not smell.  Have someone vacuum when you are not home. Use a vacuum cleaner with a HEPA filter if possible.  Replace carpet with wood, tile, or vinyl flooring. Carpet can trap animal skin flakes and dust.  Use allergy-proof pillows, mattress covers, and box spring covers.  Wash bed sheets and blankets every week in hot water and dry them in a dryer.  Use blankets that are made of polyester or cotton.  Clean bathrooms and kitchens with bleach. If possible, have someone repaint the walls in these rooms with mold-resistant paint. Keep out of the rooms that are being cleaned and painted.  Wash hands often. GET HELP IF:  You have make a whistling sound when breaking (wheeze), have shortness of breath, or have a cough even if taking medicine to prevent attacks.  The colored mucus you cough up (sputum) is thicker than usual.  The colored mucus you cough up changes from clear or white to yellow, green, gray, or bloody.  You have problems from the medicine you are taking such as:  A rash.  Itching.  Swelling.  Trouble breathing.  You need reliever medicines more than 2-3 times a week.  Your peak flow measurement is still at 50-79%  of your personal best after following the action plan for 1 hour.  You have a fever. GET HELP RIGHT AWAY IF:   You seem to be worse and are not responding to medicine during an asthma attack.  You are short of breath even at rest.  You get short of breath when doing very little activity.  You have trouble eating, drinking, or talking.  You have chest pain.  You have a fast heartbeat.  Your lips or fingernails start to turn blue.  You are light-headed, dizzy, or faint.  Your peak flow is less than 50% of your personal best. MAKE SURE YOU:   Understand these instructions.  Will watch your condition.  Will get help right away if you are not doing well or get worse. Document Released: 05/23/2008 Document Revised:  04/21/2014 Document Reviewed: 07/04/2013 [facility] Patient Information 2015 [place], [place]. This information is not intended to replace advice given to you by your health care provider. Make sure you discuss any questions you have with your health care provider.  Bronchospasm A bronchospasm is a spasm or tightening of the airways going into the lungs. During a bronchospasm breathing becomes more difficult because the airways get smaller. When this happens there can be coughing, a whistling sound when breathing (wheezing), and difficulty breathing. Bronchospasm is often associated with asthma, but not all patients who experience a bronchospasm have asthma. CAUSES  A bronchospasm is caused by inflammation or irritation of the airways. The inflammation or irritation may be triggered by:   Allergies (such as to animals, pollen, food, or mold). Allergens that cause bronchospasm may cause wheezing immediately after exposure or many hours later.   Infection. Viral infections are believed to be the most common cause of bronchospasm.   Exercise.   Irritants (such as pollution, cigarette smoke, strong odors, aerosol sprays, and paint fumes).   Weather changes. Winds increase molds and pollens in the air. Rain refreshes the air by washing irritants out. Cold air may cause inflammation.   Stress and emotional upset.  SIGNS AND SYMPTOMS   Wheezing.   Excessive nighttime coughing.   Frequent or severe coughing with a simple cold.   Chest tightness.   Shortness of breath.  DIAGNOSIS  Bronchospasm is usually diagnosed through a history and physical exam. Tests, such as chest X-rays, are sometimes done to look for other conditions. TREATMENT   Inhaled medicines can be given to open up your airways and help you breathe. The medicines can be given using either an inhaler or a nebulizer machine.  Corticosteroid medicines may be given for severe bronchospasm, usually when it is associated  with asthma. HOME CARE INSTRUCTIONS   Always have a plan prepared for seeking medical care. Know when to call your health care provider and local emergency services (911 in the [place] Know where you can access local emergency care.  Only take medicines as directed by your health care provider.  If you were prescribed an inhaler or nebulizer machine, ask your health care provider to explain how to use it correctly. Always use a spacer with your inhaler if you were given one.  It is necessary to remain calm during an attack. Try to relax and breathe more slowly.  Control your home environment in the following ways:   Change your heating and air conditioning filter at least once a month.   Limit your use of fireplaces and wood stoves.  Do not smoke and do not allow smoking in your home.  Avoid exposure to perfumes and fragrances.   Get rid of pests (such as roaches and mice) and their droppings.   Throw away plants if you see mold on them.   Keep your house clean and dust free.   Replace carpet with wood, tile, or vinyl flooring. Carpet can trap dander and dust.   Use allergy-proof pillows, mattress covers, and box spring covers.   Wash bed sheets and blankets every week in hot water and dry them in a dryer.   Use blankets that are made of polyester or cotton.   Wash hands frequently. SEEK MEDICAL CARE IF:   You have muscle aches.   You have chest pain.   The sputum changes from clear or white to yellow, green, gray, or bloody.   The sputum you cough up gets thicker.   There are problems that may be related to the medicine you are given, such as a rash, itching, swelling, or trouble breathing.  SEEK IMMEDIATE MEDICAL CARE IF:   You have worsening wheezing and coughing even after taking your prescribed medicines.   You have increased difficulty breathing.   You develop severe chest pain. MAKE SURE YOU:   Understand these instructions.  Will  watch your condition.  Will get help right away if you are not doing well or get worse. Document Released: 12/08/2003 Document Revised: 12/10/2013 Document Reviewed: 05/27/2013 [facility] Patient Information 2015 [place], [place]. This information is not intended to replace advice given to you by your health care provider. Make sure you discuss any questions you have with your health care provider.  Asthma Attack Prevention Although there is no way to prevent asthma from starting, you can take steps to control the disease and reduce its symptoms. Learn about your asthma and how to control it. Take an active role to control your asthma by working with your health care provider to create and follow an asthma action plan. An asthma action plan guides you in:  Taking your medicines properly.  Avoiding things that set off your asthma or make your asthma worse (asthma triggers).  Tracking your level of asthma control.  Responding to worsening asthma.  Seeking emergency care when needed. To track your asthma, keep records of your symptoms, check your peak flow number using a handheld device that shows how well air moves out of your lungs (peak flow meter), and get regular asthma checkups.  WHAT ARE SOME WAYS TO PREVENT AN ASTHMA ATTACK?  Take medicines as directed by your health care provider.  Keep track of your asthma symptoms and level of control.  With your health care provider, write a detailed plan for taking medicines and managing an asthma attack. Then be sure to follow your action plan. Asthma is an ongoing condition that needs regular monitoring and treatment.  Identify and avoid asthma triggers. Many outdoor allergens and irritants (such as pollen, mold, cold air, and air pollution) can trigger asthma attacks. Find out what your asthma triggers are and take steps to avoid them.  Monitor your breathing. Learn to recognize warning signs of an attack, such as coughing, wheezing, or  shortness of breath. Your lung function may decrease before you notice any signs or symptoms, so regularly measure and record your peak airflow with a home peak flow meter.  Identify and treat attacks early. If you act quickly, you are less likely to have a severe attack. You will also need less medicine to control your symptoms. When your peak flow measurements decrease and alert  you to an upcoming attack, take your medicine as instructed and immediately stop any activity that may have triggered the attack. If your symptoms do not improve, get medical help.  Pay attention to increasing quick-relief inhaler use. If you find yourself relying on your quick-relief inhaler, your asthma is not under control. See your health care provider about adjusting your treatment. WHAT CAN MAKE MY SYMPTOMS WORSE? A number of common things can set off or make your asthma symptoms worse and cause temporary increased inflammation of your airways. Keep track of your asthma symptoms for several weeks, detailing all the environmental and emotional factors that are linked with your asthma. When you have an asthma attack, go back to your asthma diary to see which factor, or combination of factors, might have contributed to it. Once you know what these factors are, you can take steps to control many of them. If you have allergies and asthma, it is important to take asthma prevention steps at home. Minimizing contact with the substance to which you are allergic will help prevent an asthma attack. Some triggers and ways to avoid these triggers are: Animal Dander:  Some people are allergic to the flakes of skin or dried saliva from animals with fur or feathers.   There is no such thing as a hypoallergenic dog or cat breed. All dogs or cats can cause allergies, even if they don't shed.  Keep these pets out of your home.  If you are not able to keep a pet outdoors, keep the pet out of your bedroom and other sleeping areas at all  times, and keep the door closed.  Remove carpets and furniture covered with cloth from your home. If that is not possible, keep the pet away from fabric-covered furniture and carpets. Dust Mites: Many people with asthma are allergic to dust mites. Dust mites are tiny bugs that are found in every home in mattresses, pillows, carpets, fabric-covered furniture, bedcovers, clothes, stuffed toys, and other fabric-covered items.   Cover your mattress in a special dust-proof cover.  Cover your pillow in a special dust-proof cover, or wash the pillow each week in hot water. Water must be hotter than 130 F (54.4 C) to kill dust mites. Cold or warm water used with detergent and bleach can also be effective.  Wash the sheets and blankets on your bed each week in hot water.  Try not to sleep or lie on cloth-covered cushions.  Call ahead when traveling and ask for a smoke-free hotel room. Bring your own bedding and pillows in case the hotel only supplies feather pillows and down comforters, which may contain dust mites and cause asthma symptoms.  Remove carpets from your bedroom and those laid on concrete, if you can.  Keep stuffed toys out of the bed, or wash the toys weekly in hot water or cooler water with detergent and bleach. Cockroaches: Many people with asthma are allergic to the droppings and remains of cockroaches.   Keep food and garbage in closed containers. Never leave food out.  Use poison baits, traps, powders, gels, or paste (for example, boric acid).  If a spray is used to kill cockroaches, stay out of the room until the odor goes away. Indoor Mold:  Fix leaky faucets, pipes, or other sources of water that have mold around them.  Clean floors and moldy surfaces with a fungicide or diluted bleach.  Avoid using humidifiers, vaporizers, or swamp coolers. These can spread molds through the air. Pollen and Outdoor  Mold:  When pollen or mold spore counts are high, try to keep your  windows closed.  Stay indoors with windows closed from late morning to afternoon. Pollen and some mold spore counts are highest at that time.  Ask your health care provider whether you need to take anti-inflammatory medicine or increase your dose of the medicine before your allergy season starts. Other Irritants to Avoid:  Tobacco smoke is an irritant. If you smoke, ask your health care provider how you can quit. Ask family members to quit smoking, too. Do not allow smoking in your home or car.  If possible, do not use a wood-burning stove, kerosene heater, or fireplace. Minimize exposure to all sources of smoke, including incense, candles, fires, and fireworks.  Try to stay away from strong odors and sprays, such as perfume, talcum powder, hair spray, and paints.  Decrease humidity in your home and use an indoor air cleaning device. Reduce indoor humidity to below 60%. Dehumidifiers or central air conditioners can do this.  Decrease house dust exposure by changing furnace and air cooler filters frequently.  Try to have someone else vacuum for you once or twice a week. Stay out of rooms while they are being vacuumed and for a short while afterward.  If you vacuum, use a dust mask from a hardware store, a double-layered or microfilter vacuum cleaner bag, or a vacuum cleaner with a HEPA filter.  Sulfites in foods and beverages can be irritants. Do not drink beer or wine or eat dried fruit, processed potatoes, or shrimp if they cause asthma symptoms.  Cold air can trigger an asthma attack. Cover your nose and mouth with a scarf on cold or windy days.  Several health conditions can make asthma more difficult to manage, including a runny nose, sinus infections, reflux disease, psychological stress, and sleep apnea. Work with your health care provider to manage these conditions.  Avoid close contact with people who have a respiratory infection such as a cold or the flu, since your asthma  symptoms may get worse if you catch the infection. Wash your hands thoroughly after touching items that may have been handled by people with a respiratory infection.  Get a flu shot every year to protect against the flu virus, which often makes asthma worse for days or weeks. Also get a pneumonia shot if you have not previously had one. Unlike the flu shot, the pneumonia shot does not need to be given yearly. Medicines:  Talk to your health care provider about whether it is safe for you to take aspirin or non-steroidal anti-inflammatory medicines (NSAIDs). In a small number of people with asthma, aspirin and NSAIDs can cause asthma attacks. These medicines must be avoided by people who have known aspirin-sensitive asthma. It is important that people with aspirin-sensitive asthma read labels of all over-the-counter medicines used to treat pain, colds, coughs, and fever.  Beta-blockers and ACE inhibitors are other medicines you should discuss with your health care provider. HOW CAN I FIND OUT WHAT I AM ALLERGIC TO? Ask your asthma health care provider about allergy skin testing or blood testing (the RAST test) to identify the allergens to which you are sensitive. If you are found to have allergies, the most important thing to do is to try to avoid exposure to any allergens that you are sensitive to as much as possible. Other treatments for allergies, such as medicines and allergy shots (immunotherapy) are available.  CAN I EXERCISE? Follow your health care provider's advice  regarding asthma treatment before exercising. It is important to maintain a regular exercise program, but vigorous exercise or exercise in cold, humid, or dry environments can cause asthma attacks, especially for those people who have exercise-induced asthma. Document Released: 11/23/2009 Document Revised: 12/10/2013 Document Reviewed: 06/12/2013 [facility] Patient Information 2015 [place], [place]. This information is not intended to  replace advice given to you by your health care provider. Make sure you discuss any questions you have with your health care provider.

## 2015-03-21 NOTE — Progress Notes (Addendum)
CARE MANAGEMENT NOTE 03/21/2015  Patient:  [name], [name]   Account Number:  [id number]  Date Initiated:  03/21/2015  Documentation initiated by:  [facility]  Subjective/Objective Assessment:   asthma     Action/Plan:   Anticipated DC Date:  03/21/2015   Anticipated DC Plan:  [place]  CM consult  [facility]      Choice offered to / List presented to:             Status of service:  Completed, signed off Medicare Important Message given?   (If response is "NO", the following Medicare IM given date fields will be blank) Date Medicare IM given:   Medicare IM given by:   Date Additional Medicare IM given:   Additional Medicare IM given by:    Discharge Disposition:  HOME/SELF CARE  Per UR Regulation:    If discussed at Long Length of Stay Meetings, dates discussed:    Comments:  03/21/2015  3:46 PM Contacted [place] D[employer] rep for neb machine for home. Pt being followed at [facility]. Jonnie Finner RN CCM Case Mgmt [phone]

## 2015-03-21 NOTE — Discharge Summary (Signed)
Reviewed d/c instructions with pt including medications, precautions, asthma educational handouts, and follow-up appointments.  Pt verbalized understanding of all topics discussed.  Pt being d/c to home with nebulizer machine provided by [facility].

## 2015-03-21 NOTE — ED Notes (Addendum)
Family at bedside. 

## 2015-03-26 ENCOUNTER — Ambulatory Visit: Payer: Medicaid Other | Attending: Internal Medicine | Admitting: Internal Medicine

## 2015-03-26 ENCOUNTER — Encounter: Payer: Self-pay | Admitting: Internal Medicine

## 2015-03-26 VITALS — BP 137/90 | HR 75 | Temp 98.3°F | Resp 16 | Ht 60.0 in | Wt 163.0 lb

## 2015-03-26 DIAGNOSIS — N943 Premenstrual tension syndrome: Secondary | ICD-10-CM

## 2015-03-26 DIAGNOSIS — J4520 Mild intermittent asthma, uncomplicated: Secondary | ICD-10-CM

## 2015-03-26 DIAGNOSIS — G43829 Menstrual migraine, not intractable, without status migrainosus: Secondary | ICD-10-CM

## 2015-03-26 DIAGNOSIS — N946 Dysmenorrhea, unspecified: Secondary | ICD-10-CM

## 2015-03-26 DIAGNOSIS — J45909 Unspecified asthma, uncomplicated: Secondary | ICD-10-CM | POA: Insufficient documentation

## 2015-03-26 MED ORDER — TRAMADOL HCL 50 MG PO TABS: 50.0000 mg | ORAL_TABLET | Freq: Three times a day (TID) | ORAL | Status: DC | PRN

## 2015-03-26 MED ORDER — ALBUTEROL SULFATE HFA 108 (90 BASE) MCG/ACT IN AERS: 2.0000 | INHALATION_SPRAY | Freq: Four times a day (QID) | RESPIRATORY_TRACT | Status: DC | PRN

## 2015-03-26 NOTE — Progress Notes (Signed)
Pt is here following up on her asthma, HTN, anemia, and her GERD. Pt states that she is having extreme painful menstrual cycles. Pt also gets severe migraines when her cycle is about to begin.

## 2015-03-26 NOTE — Progress Notes (Signed)
Patient ID: [name], female   DOB: 1976-05-28, 39 y.o.   MRN: [id number]  CC: f/u  HPI: [name] is a 39 y.o. female here today for a follow up visit.  Patient has past medical history of bipolar disorder, migraines, ovarian cyst. HTN, asthma, and anemia.  She has had several surgeries on her female anatomy. She has been to several doctors and on multiple forms of birth control in attempt to control her pain. The first four days of her cycle is accompanied by severe cramps. She c/o of progressively severe migraines over the last year for the first 4-5 days of cycle as well. She states that she has been taking tylenol PM to help her sleep through the pain.  She is very frustrated because she has been dealing with this problem for 18 years. She states that she has a appointment to re-establish care with [facility].  Patient has No headache, No chest pain, No abdominal pain - No Nausea, No new weakness tingling or numbness, No Cough - SOB.  Allergies  Allergen Reactions  . Prednisone Swelling and Rash    Leg swells up; pt reports I.V use with no problems   Past Medical History  Diagnosis Date  . Bipolar 1 disorder, depressed, mild   . Migraines   . GERD (gastroesophageal reflux disease)   . Bilateral ovarian cysts   . Asthma   . Pregnancy induced hypertension   . HTN (hypertension)   . Anemia    Current Outpatient Prescriptions on File Prior to Visit  Medication Sig Dispense Refill  . albuterol (PROVENTIL HFA;VENTOLIN HFA) 108 (90 BASE) MCG/ACT inhaler Inhale 2 puffs into the lungs every 6 (six) hours as needed for wheezing or shortness of breath. 1 Inhaler 0  . albuterol (PROVENTIL) (2.5 MG/3ML) 0.083% nebulizer solution Take 3 mLs (2.5 mg total) by nebulization every 4 (four) hours as needed for wheezing. 75 mL 1  . diphenhydramine-acetaminophen (TYLENOL PM) 25-500 MG TABS Take 3 tablets by mouth 2 (two) times daily as needed (pain).    Marland Kitchen acetaminophen  (TYLENOL) 500 MG tablet Take 1,000 mg by mouth every 6 (six) hours as needed for mild pain or headache.    Marland Kitchen azithromycin (ZITHROMAX) 500 MG tablet Take 1 tablet (500 mg total) by mouth daily. (Patient not taking: Reported on 03/26/2015) 5 tablet 0  . predniSONE (DELTASONE) 50 MG tablet Take prednisone 50 mg daily for 5 days. (Patient not taking: Reported on 03/26/2015) 5 tablet 0   No current facility-administered medications on file prior to visit.   Family History  Problem Relation Age of Onset  . Asthma Mother   . Asthma Brother    History   Social History  . Marital Status: Single    Spouse Name: N/A  . Number of Children: N/A  . Years of Education: N/A   Occupational History  . Not on file.   Social History Main Topics  . Smoking status: Former Smoker -- 0.50 packs/day    Types: Cigarettes    Quit date: 04/09/2014  . Smokeless tobacco: Never Used  . Alcohol Use: Yes     Comment: twice a year  . Drug Use: No  . Sexual Activity: Yes    Birth Control/ Protection: Surgical   Other Topics Concern  . Not on file   Social History Narrative    Review of Systems: See HPI   Objective:   Filed Vitals:   03/26/15 1130  BP: 137/90  Pulse: 75  Temp: 98.3 F (36.8 C)  Resp: 16    Physical Exam: Constitutional: Patient appears well-developed and well-nourished. No distress. Neck: Normal ROM. Neck supple. No JVD. No tracheal deviation. No thyromegaly. CVS: RRR, S1/S2 +, no murmurs, no gallops, no carotid bruit.  Pulmonary: Effort and breath sounds normal, no stridor, rhonchi, wheezes, rales.  Abdominal: Soft. BS +,  no distension, rebound or guarding. Mild lower abdominal tenderness Musculoskeletal: Normal range of motion. No edema and no tenderness.  Neuro: Alert. Normal reflexes, muscle tone coordination. No cranial nerve deficit. Skin: Skin is warm and dry. No rash noted. Not diaphoretic. No erythema. No pallor. Psychiatric: Normal mood and affect. Behavior,  judgment, thought content normal.  Lab Results  Component Value Date   WBC 9.5 03/21/2015   HGB 12.7 03/21/2015   HCT 37.9 03/21/2015   MCV 94.5 03/21/2015   PLT 328 03/21/2015   Lab Results  Component Value Date   CREATININE 0.76 03/21/2015   BUN 10 03/20/2015   NA 142 03/20/2015   K 3.1* 03/20/2015   CL 101 03/20/2015   CO2 21 12/18/2014    No results found for: HGBA1C Lipid Panel  No results found for: CHOL, TRIG, HDL, CHOLHDL, VLDL, LDLCALC     Assessment and plan:   Shaterica was seen today for follow-up.  Diagnoses and all orders for this visit:  Menstrual cramps Orders: -     traMADol (ULTRAM) 50 MG tablet; Take 1 tablet (50 mg total) by mouth every 8 (eight) hours as needed. Explained that she may take tramadol when her cramps are severe to keep her from going to the ER for pain control. She will address all issues with GYN to see if they may further assist in pain control  Menstrual migraine without status migrainosus, not intractable Explained to patient that she may begin to take ibuprofen 600 mg twice daily for the 4-5 days before her cycle and during cycle to see if that will help with menstrual migraines. Explained that triptans may not be as effective for menstrual migraines.  Asthma, mild intermittent, uncomplicated Orders: -   Refill albuterol (PROVENTIL HFA;VENTOLIN HFA) 108 (90 BASE) MCG/ACT inhaler; Inhale 2 puffs into the lungs every 6 (six) hours as needed for wheezing or shortness of breath. Patients inhaler is expired.  Return if symptoms worsen or fail to improve.       Rachel Manning, NP-C [facility] [phone] 03/26/2015, 12:03 PM

## 2015-03-26 NOTE — Patient Instructions (Signed)
Take ibuprofen for the week before your cycle begins.  I have given you tramadol to help with pain during your cycles. Please discuss all issues with GYN and see if they have any more recommendations for pain

## 2015-04-29 ENCOUNTER — Ambulatory Visit: Payer: Self-pay | Admitting: Obstetrics & Gynecology

## 2015-05-21 ENCOUNTER — Telehealth: Payer: Self-pay | Admitting: Internal Medicine

## 2015-06-30 ENCOUNTER — Emergency Department (HOSPITAL_COMMUNITY): Payer: Self-pay

## 2015-06-30 ENCOUNTER — Emergency Department (HOSPITAL_COMMUNITY)
Admission: EM | Admit: 2015-06-30 | Discharge: 2015-06-30 | Disposition: A | Discharge status: Home / Self Care | Payer: Self-pay | Attending: Emergency Medicine | Admitting: Emergency Medicine

## 2015-06-30 ENCOUNTER — Encounter (HOSPITAL_COMMUNITY): Payer: Self-pay | Admitting: Emergency Medicine

## 2015-06-30 DIAGNOSIS — Z8742 Personal history of other diseases of the female genital tract: Secondary | ICD-10-CM | POA: Insufficient documentation

## 2015-06-30 DIAGNOSIS — M2550 Pain in unspecified joint: Secondary | ICD-10-CM | POA: Insufficient documentation

## 2015-06-30 DIAGNOSIS — N946 Dysmenorrhea, unspecified: Secondary | ICD-10-CM | POA: Insufficient documentation

## 2015-06-30 DIAGNOSIS — R102 Pelvic and perineal pain: Secondary | ICD-10-CM

## 2015-06-30 DIAGNOSIS — M545 Low back pain: Secondary | ICD-10-CM | POA: Insufficient documentation

## 2015-06-30 DIAGNOSIS — Z8719 Personal history of other diseases of the digestive system: Secondary | ICD-10-CM | POA: Insufficient documentation

## 2015-06-30 DIAGNOSIS — Z862 Personal history of diseases of the blood and blood-forming organs and certain disorders involving the immune mechanism: Secondary | ICD-10-CM | POA: Insufficient documentation

## 2015-06-30 DIAGNOSIS — I10 Essential (primary) hypertension: Secondary | ICD-10-CM | POA: Insufficient documentation

## 2015-06-30 DIAGNOSIS — G8929 Other chronic pain: Secondary | ICD-10-CM | POA: Insufficient documentation

## 2015-06-30 DIAGNOSIS — Z3202 Encounter for pregnancy test, result negative: Secondary | ICD-10-CM | POA: Insufficient documentation

## 2015-06-30 DIAGNOSIS — Z79899 Other long term (current) drug therapy: Secondary | ICD-10-CM | POA: Insufficient documentation

## 2015-06-30 DIAGNOSIS — Z87891 Personal history of nicotine dependence: Secondary | ICD-10-CM | POA: Insufficient documentation

## 2015-06-30 DIAGNOSIS — J45909 Unspecified asthma, uncomplicated: Secondary | ICD-10-CM | POA: Insufficient documentation

## 2015-06-30 LAB — URINALYSIS, ROUTINE W REFLEX MICROSCOPIC
Bilirubin Urine: NEGATIVE
GLUCOSE, UA: NEGATIVE mg/dL
KETONES UR: NEGATIVE mg/dL
Nitrite: NEGATIVE
PH: 6.5 (ref 5.0–8.0)
Protein, ur: 30 mg/dL — AB
Specific Gravity, Urine: 1.029 (ref 1.005–1.030)
UROBILINOGEN UA: 1 mg/dL (ref 0.0–1.0)

## 2015-06-30 LAB — CBC WITH DIFFERENTIAL/PLATELET
Basophils Absolute: 0 10*3/uL (ref 0.0–0.1)
Basophils Relative: 1 % (ref 0–1)
EOS ABS: 0.6 10*3/uL (ref 0.0–0.7)
EOS PCT: 7 % — AB (ref 0–5)
HCT: 39.3 % (ref 36.0–46.0)
Hemoglobin: 13.4 g/dL (ref 12.0–15.0)
Lymphocytes Relative: 41 % (ref 12–46)
Lymphs Abs: 3.4 10*3/uL (ref 0.7–4.0)
MCH: 31.8 pg (ref 26.0–34.0)
MCHC: 34.1 g/dL (ref 30.0–36.0)
MCV: 93.3 fL (ref 78.0–100.0)
Monocytes Absolute: 0.5 10*3/uL (ref 0.1–1.0)
Monocytes Relative: 6 % (ref 3–12)
Neutro Abs: 4 10*3/uL (ref 1.7–7.7)
Neutrophils Relative %: 47 % (ref 43–77)
PLATELETS: 354 10*3/uL (ref 150–400)
RBC: 4.21 MIL/uL (ref 3.87–5.11)
RDW: 12.9 % (ref 11.5–15.5)
WBC: 8.5 10*3/uL (ref 4.0–10.5)

## 2015-06-30 LAB — WET PREP, GENITAL
Clue Cells Wet Prep HPF POC: NONE SEEN
Trich, Wet Prep: NONE SEEN
WBC, Wet Prep HPF POC: NONE SEEN
YEAST WET PREP: NONE SEEN

## 2015-06-30 LAB — I-STAT CHEM 8, ED
BUN: 12 mg/dL (ref 6–20)
Calcium, Ion: 1.17 mmol/L (ref 1.12–1.23)
Chloride: 107 mmol/L (ref 101–111)
Creatinine, Ser: 0.8 mg/dL (ref 0.44–1.00)
Glucose, Bld: 101 mg/dL — ABNORMAL HIGH (ref 65–99)
HCT: 42 % (ref 36.0–46.0)
Hemoglobin: 14.3 g/dL (ref 12.0–15.0)
Potassium: 4.1 mmol/L (ref 3.5–5.1)
Sodium: 139 mmol/L (ref 135–145)
TCO2: 21 mmol/L (ref 0–100)

## 2015-06-30 LAB — URINE MICROSCOPIC-ADD ON

## 2015-06-30 LAB — POC URINE PREG, ED: Preg Test, Ur: NEGATIVE

## 2015-06-30 MED ORDER — DIAZEPAM 5 MG PO TABS: 5.0000 mg | ORAL_TABLET | Freq: Three times a day (TID) | ORAL | Status: DC | PRN

## 2015-06-30 MED ORDER — NAPROXEN 500 MG PO TABS: 500.0000 mg | ORAL_TABLET | Freq: Two times a day (BID) | ORAL | Status: DC

## 2015-06-30 MED ORDER — OXYCODONE-ACETAMINOPHEN 5-325 MG PO TABS
1.0000 | ORAL_TABLET | Freq: Once | ORAL | Status: AC
  Administered 2015-06-30: 1 via ORAL
  Filled 2015-06-30: qty 1

## 2015-06-30 MED ORDER — KETOROLAC TROMETHAMINE 60 MG/2ML IM SOLN
60.0000 mg | Freq: Once | INTRAMUSCULAR | Status: AC
  Administered 2015-06-30: 60 mg via INTRAMUSCULAR
  Filled 2015-06-30: qty 2

## 2015-06-30 NOTE — ED Notes (Signed)
Patient transported to Ultrasound 

## 2015-06-30 NOTE — ED Notes (Signed)
Per patient, states she started cycle yesterday-complaining of back and abdominal pain-pain management appoint on the 25th

## 2015-06-30 NOTE — Discharge Instructions (Signed)
Naprosyn for pain and inflammation. Valium for spasms. Try heating pads. Follow up with primary care doctor or OB/GYN for further evaluation.   Dysmenorrhea Menstrual cramps (dysmenorrhea) are caused by the muscles of the uterus tightening (contracting) during a menstrual period. For some women, this discomfort is merely bothersome. For others, dysmenorrhea can be severe enough to interfere with everyday activities for a few days each month. Primary dysmenorrhea is menstrual cramps that last a couple of days when you start having menstrual periods or soon after. This often begins after a teenager starts having her period. As a woman gets older or has a baby, the cramps will usually lessen or disappear. Secondary dysmenorrhea begins later in life, lasts longer, and the pain may be stronger than primary dysmenorrhea. The pain may start before the period and last a few days after the period.  CAUSES  Dysmenorrhea is usually caused by an underlying problem, such as:  The tissue lining the uterus grows outside of the uterus in other areas of the body (endometriosis).  The endometrial tissue, which normally lines the uterus, is found in or grows into the muscular walls of the uterus (adenomyosis).  The pelvic blood vessels are engorged with blood just before the menstrual period (pelvic congestive syndrome).  Overgrowth of cells (polyps) in the lining of the uterus or cervix.  Falling down of the uterus (prolapse) because of loose or stretched ligaments.  Depression.  Bladder problems, infection, or inflammation.  Problems with the intestine, a tumor, or irritable bowel syndrome.  Cancer of the female organs or bladder.  A severely tipped uterus.  A very tight opening or closed cervix.  Noncancerous tumors of the uterus (fibroids).  Pelvic inflammatory disease (PID).  Pelvic scarring (adhesions) from a previous surgery.  Ovarian cyst.  An intrauterine device (IUD) used for birth  control. RISK FACTORS You may be at greater risk of dysmenorrhea if:  You are younger than age 64.  You started puberty early.  You have irregular or heavy bleeding.  You have never given birth.  You have a family history of this problem.  You are a smoker. SIGNS AND SYMPTOMS   Cramping or throbbing pain in your lower abdomen.  Headaches.  Lower back pain.  Nausea or vomiting.  Diarrhea.  Sweating or dizziness.  Loose stools. DIAGNOSIS  A diagnosis is based on your history, symptoms, physical exam, diagnostic tests, or procedures. Diagnostic tests or procedures may include:  Blood tests.  Ultrasonography.  An examination of the lining of the uterus (dilation and curettage, D&C).  An examination inside your abdomen or pelvis with a scope (laparoscopy).  X-rays.  CT scan.  MRI.  An examination inside the bladder with a scope (cystoscopy).  An examination inside the intestine or stomach with a scope (colonoscopy, gastroscopy). TREATMENT  Treatment depends on the cause of the dysmenorrhea. Treatment may include:  Pain medicine prescribed by your health care provider.  Birth control pills or an IUD with progesterone hormone in it.  Hormone replacement therapy.  Nonsteroidal anti-inflammatory drugs (NSAIDs). These may help stop the production of prostaglandins.  Surgery to remove adhesions, endometriosis, ovarian cyst, or fibroids.  Removal of the uterus (hysterectomy).  Progesterone shots to stop the menstrual period.  Cutting the nerves on the sacrum that go to the female organs (presacral neurectomy).  Electric current to the sacral nerves (sacral nerve stimulation).  Antidepressant medicine.  Psychiatric therapy, counseling, or group therapy.  Exercise and physical therapy.  Meditation and yoga therapy.  Acupuncture. HOME CARE INSTRUCTIONS   Only take over-the-counter or prescription medicines as directed by your health care  provider.  Place a heating pad or hot water bottle on your lower back or abdomen. Do not sleep with the heating pad.  Use aerobic exercises, walking, swimming, biking, and other exercises to help lessen the cramping.  Massage to the lower back or abdomen may help.  Stop smoking.  Avoid alcohol and caffeine. SEEK MEDICAL CARE IF:   Your pain does not get better with medicine.  You have pain with sexual intercourse.  Your pain increases and is not controlled with medicines.  You have abnormal vaginal bleeding with your period.  You develop nausea or vomiting with your period that is not controlled with medicine. SEEK IMMEDIATE MEDICAL CARE IF:  You pass out.  Document Released: 12/05/2005 Document Revised: 08/07/2013 Document Reviewed: 05/23/2013 [facility] Patient Information 2015 [place], [place]. This information is not intended to replace advice given to you by your health care provider. Make sure you discuss any questions you have with your health care provider.

## 2015-06-30 NOTE — ED Provider Notes (Signed)
CSN: [id number]     Arrival date & time 06/30/15  0912 History   First MD Initiated Contact with Patient 06/30/15 0915     Chief Complaint  Patient presents with  . Abdominal Pain  . Back Pain     (Consider location/radiation/quality/duration/timing/severity/associated sxs/prior Treatment) HPI [name] is a 39 y.o. female with history of chronic pelvic pain, presents to emergency department complaining of lower back pain and lower abdominal pain. Patient states he started yesterday when she began her menstrual period. Patient states that she usually gets these pains with her cycles. She states cold exacerbates the pain, heat makes it better. She denies any urinary symptoms. No fever or chills. Took a Vicodin and Tylenol yesterday with no relief of her symptoms. She states that she lost her insurance and has not been able to see her OB/GYN, but states she got her insurance back and has an appointment in 2 weeks to see a pain management specialist. Patient states pain is across lower back and radiates into the right lower abdomen. She states as far her normal chronic pain is.  Past Medical History  Diagnosis Date  . Bipolar 1 disorder, depressed, mild   . Migraines   . GERD (gastroesophageal reflux disease)   . Bilateral ovarian cysts   . Asthma   . Pregnancy induced hypertension   . HTN (hypertension)   . Anemia    Past Surgical History  Procedure Laterality Date  . Cesarean section    . Induced abortion    . Tubal ligation     Family History  Problem Relation Age of Onset  . Asthma Mother   . Asthma Brother    History  Substance Use Topics  . Smoking status: Former Smoker -- 0.50 packs/day    Types: Cigarettes    Quit date: 04/09/2014  . Smokeless tobacco: Never Used  . Alcohol Use: Yes     Comment: twice a year   OB History    Gravida Para Term Preterm AB TAB SAB Ectopic Multiple Living   3 2 0 1 1 1    2      Review of Systems  Constitutional: Negative for  fever and chills.  Respiratory: Negative for cough, chest tightness and shortness of breath.   Cardiovascular: Negative for chest pain, palpitations and leg swelling.  Gastrointestinal: Positive for abdominal pain. Negative for nausea, vomiting and diarrhea.  Genitourinary: Positive for vaginal bleeding and pelvic pain. Negative for dysuria, flank pain, vaginal discharge and vaginal pain.  Musculoskeletal: Positive for back pain and arthralgias. Negative for myalgias, neck pain and neck stiffness.  Skin: Negative for rash.  Neurological: Negative for dizziness, weakness and headaches.  All other systems reviewed and are negative.     Allergies  Prednisone  Home Medications   Prior to Admission medications   Medication Sig Start Date End Date Taking? Authorizing Provider  albuterol (PROVENTIL HFA;VENTOLIN HFA) 108 (90 BASE) MCG/ACT inhaler Inhale 2 puffs into the lungs every 6 (six) hours as needed for wheezing or shortness of breath. 03/26/15  Yes Lance Bosch, NP  albuterol (PROVENTIL) (2.5 MG/3ML) 0.083% nebulizer solution Take 3 mLs (2.5 mg total) by nebulization every 4 (four) hours as needed for wheezing. 03/21/15  Yes Robbie Lis, MD  diphenhydramine-acetaminophen (TYLENOL PM) 25-500 MG TABS Take 2 tablets by mouth 2 (two) times daily as needed (pain).    Yes Historical Provider, MD  HYDROcodone-acetaminophen (NORCO/VICODIN) 5-325 MG per tablet Take 1 tablet by mouth once.  Yes Historical Provider, MD  traMADol (ULTRAM) 50 MG tablet Take 1 tablet (50 mg total) by mouth every 8 (eight) hours as needed. Patient not taking: Reported on 06/30/2015 03/26/15   Lance Bosch, NP   BP 134/93 mmHg  Pulse 75  Temp(Src) 97.8 F (36.6 C) (Oral)  Resp 18  SpO2 100%  LMP 06/29/2015 Physical Exam  Constitutional: She appears well-developed and well-nourished. No distress.  HENT:  Head: Normocephalic.  Eyes: Conjunctivae are normal.  Neck: Neck supple.  Cardiovascular: Normal rate,  regular rhythm and normal heart sounds.   Pulmonary/Chest: Effort normal and breath sounds normal. No respiratory distress. She has no wheezes. She has no rales.  Abdominal: Soft. Bowel sounds are normal. She exhibits no distension. There is no tenderness. There is no rebound.  Genitourinary:  Normal external genitalia. Blood in vaginal canal. Cervix normal, closed. No CMT. No uterine tenderness. Right adnexal tenderness  Musculoskeletal: She exhibits no edema.  Neurological: She is alert.  Skin: Skin is warm and dry.  Psychiatric: She has a normal mood and affect. Her behavior is normal.  Nursing note and vitals reviewed.   ED Course  Procedures (including critical care time) Labs Review Labs Reviewed  CBC WITH DIFFERENTIAL/PLATELET - Abnormal; Notable for the following:    Eosinophils Relative 7 (*)    All other components within normal limits  URINALYSIS, ROUTINE W REFLEX MICROSCOPIC (NOT AT [facility]) - Abnormal; Notable for the following:    Color, Urine RED (*)    APPearance TURBID (*)    Hgb urine dipstick LARGE (*)    Protein, ur 30 (*)    Leukocytes, UA SMALL (*)    All other components within normal limits  URINE MICROSCOPIC-ADD ON - Abnormal; Notable for the following:    Squamous Epithelial / LPF MANY (*)    Bacteria, UA MANY (*)    All other components within normal limits  I-STAT CHEM 8, ED - Abnormal; Notable for the following:    Glucose, Bld 101 (*)    All other components within normal limits  WET PREP, GENITAL  POC URINE PREG, ED  GC/CHLAMYDIA PROBE AMP ([place]) NOT AT [facility]    Imaging Review US Transvaginal Non-ob  06/30/2015   CLINICAL DATA:  Right adnexal pain for 1 day. LMP 06/29/2015. Evaluate for ovarian torsion. Initial encounter.  EXAM: TRANSABDOMINAL AND TRANSVAGINAL ULTRASOUND OF PELVIS  DOPPLER ULTRASOUND OF OVARIES  TECHNIQUE: Both transabdominal and transvaginal ultrasound examinations of the pelvis were performed. Transabdominal technique was  performed for global imaging of the pelvis including uterus, ovaries, adnexal regions, and pelvic cul-de-sac.  It was necessary to proceed with endovaginal exam following the transabdominal exam to visualize the endometrium and ovaries to better advantage. Color and duplex Doppler ultrasound was utilized to evaluate blood flow to the ovaries.  COMPARISON:  Pelvic ultrasound 12/18/2014.  FINDINGS: Uterus  Measurements: 8.3 x 4.5 x 4.7 cm. There are small posterior uterine fibroids, largest measuring 1.4 cm.  Endometrium  Thickness: 5 mm.  No focal abnormality visualized.  Right ovary  Measurements: 3.3 x 1.9 x 3.0 cm. Normal appearance without adnexal mass. Normal blood flow with color Doppler.  Left ovary  Measurements: 3.1 x 2.2 x 2.2 cm. Normal appearance without adnexal mass. Normal blood flow with color Doppler.  Pulsed Doppler evaluation of both ovaries demonstrates normal low-resistance arterial and venous waveforms.  Other findings  No free fluid.  IMPRESSION: 1. No evidence of ovarian torsion or other acute/significant findings. 2. Small  uterine fibroids.   Electronically Signed   By: Richardean Sale M.D.   On: 06/30/2015 11:33   US Pelvis Complete  06/30/2015   CLINICAL DATA:  Right adnexal pain for 1 day. LMP 06/29/2015. Evaluate for ovarian torsion. Initial encounter.  EXAM: TRANSABDOMINAL AND TRANSVAGINAL ULTRASOUND OF PELVIS  DOPPLER ULTRASOUND OF OVARIES  TECHNIQUE: Both transabdominal and transvaginal ultrasound examinations of the pelvis were performed. Transabdominal technique was performed for global imaging of the pelvis including uterus, ovaries, adnexal regions, and pelvic cul-de-sac.  It was necessary to proceed with endovaginal exam following the transabdominal exam to visualize the endometrium and ovaries to better advantage. Color and duplex Doppler ultrasound was utilized to evaluate blood flow to the ovaries.  COMPARISON:  Pelvic ultrasound 12/18/2014.  FINDINGS: Uterus  Measurements:  8.3 x 4.5 x 4.7 cm. There are small posterior uterine fibroids, largest measuring 1.4 cm.  Endometrium  Thickness: 5 mm.  No focal abnormality visualized.  Right ovary  Measurements: 3.3 x 1.9 x 3.0 cm. Normal appearance without adnexal mass. Normal blood flow with color Doppler.  Left ovary  Measurements: 3.1 x 2.2 x 2.2 cm. Normal appearance without adnexal mass. Normal blood flow with color Doppler.  Pulsed Doppler evaluation of both ovaries demonstrates normal low-resistance arterial and venous waveforms.  Other findings  No free fluid.  IMPRESSION: 1. No evidence of ovarian torsion or other acute/significant findings. 2. Small uterine fibroids.   Electronically Signed   By: Richardean Sale M.D.   On: 06/30/2015 11:33   [place] Art/ven Flow Abd Pelv Doppler  06/30/2015   CLINICAL DATA:  Right adnexal pain for 1 day. LMP 06/29/2015. Evaluate for ovarian torsion. Initial encounter.  EXAM: TRANSABDOMINAL AND TRANSVAGINAL ULTRASOUND OF PELVIS  DOPPLER ULTRASOUND OF OVARIES  TECHNIQUE: Both transabdominal and transvaginal ultrasound examinations of the pelvis were performed. Transabdominal technique was performed for global imaging of the pelvis including uterus, ovaries, adnexal regions, and pelvic cul-de-sac.  It was necessary to proceed with endovaginal exam following the transabdominal exam to visualize the endometrium and ovaries to better advantage. Color and duplex Doppler ultrasound was utilized to evaluate blood flow to the ovaries.  COMPARISON:  Pelvic ultrasound 12/18/2014.  FINDINGS: Uterus  Measurements: 8.3 x 4.5 x 4.7 cm. There are small posterior uterine fibroids, largest measuring 1.4 cm.  Endometrium  Thickness: 5 mm.  No focal abnormality visualized.  Right ovary  Measurements: 3.3 x 1.9 x 3.0 cm. Normal appearance without adnexal mass. Normal blood flow with color Doppler.  Left ovary  Measurements: 3.1 x 2.2 x 2.2 cm. Normal appearance without adnexal mass. Normal blood flow with color Doppler.   Pulsed Doppler evaluation of both ovaries demonstrates normal low-resistance arterial and venous waveforms.  Other findings  No free fluid.  IMPRESSION: 1. No evidence of ovarian torsion or other acute/significant findings. 2. Small uterine fibroids.   Electronically Signed   By: Richardean Sale M.D.   On: 06/30/2015 11:33     EKG Interpretation None      MDM   Final diagnoses:  Adnexal pain   Pt with chronic pelvic pain, worsened yesterday. Started cycle yesterday. Pelvic exam with right adnexal tenderness. Hx of ovarian cysts. Will get [place] to ro torsion. toradol and percocet ordered for pain. Labs and UA pending. Urine preg negative.   12:22 PM Pt's Urine contaminated, doubt uti. [place] negative. PT feels much better. This is a chronic issue with no apparent acute findings. Will dc home with muscle relaxant, naprosyn, follow up  with OB/GYN.   Filed Vitals:   06/30/15 0919 06/30/15 1118  BP: 134/93 115/76  Pulse: 75 55  Temp: 97.8 F (36.6 C)   TempSrc: Oral   Resp: 18 18  SpO2: 100% 97%     Jeannett Senior, PA-C 06/30/15 [place], MD 07/01/15 [phone]

## 2015-07-01 LAB — GC/CHLAMYDIA PROBE AMP (~~LOC~~) NOT AT ARMC
CHLAMYDIA, DNA PROBE: NEGATIVE
Neisseria Gonorrhea: NEGATIVE

## 2015-07-16 ENCOUNTER — Other Ambulatory Visit: Payer: Self-pay | Admitting: Internal Medicine

## 2015-08-25 ENCOUNTER — Emergency Department (HOSPITAL_COMMUNITY)
Admission: EM | Admit: 2015-08-25 | Discharge: 2015-08-25 | Disposition: A | Discharge status: Home / Self Care | Payer: Self-pay | Attending: Emergency Medicine | Admitting: Emergency Medicine

## 2015-08-25 ENCOUNTER — Encounter (HOSPITAL_COMMUNITY): Payer: Self-pay | Admitting: *Deleted

## 2015-08-25 DIAGNOSIS — Z87891 Personal history of nicotine dependence: Secondary | ICD-10-CM | POA: Insufficient documentation

## 2015-08-25 DIAGNOSIS — R109 Unspecified abdominal pain: Secondary | ICD-10-CM | POA: Insufficient documentation

## 2015-08-25 DIAGNOSIS — J45909 Unspecified asthma, uncomplicated: Secondary | ICD-10-CM | POA: Insufficient documentation

## 2015-08-25 DIAGNOSIS — Z7952 Long term (current) use of systemic steroids: Secondary | ICD-10-CM | POA: Insufficient documentation

## 2015-08-25 DIAGNOSIS — Z79899 Other long term (current) drug therapy: Secondary | ICD-10-CM | POA: Insufficient documentation

## 2015-08-25 DIAGNOSIS — Z3202 Encounter for pregnancy test, result negative: Secondary | ICD-10-CM | POA: Insufficient documentation

## 2015-08-25 DIAGNOSIS — Z9851 Tubal ligation status: Secondary | ICD-10-CM | POA: Insufficient documentation

## 2015-08-25 DIAGNOSIS — Z8742 Personal history of other diseases of the female genital tract: Secondary | ICD-10-CM | POA: Insufficient documentation

## 2015-08-25 DIAGNOSIS — I10 Essential (primary) hypertension: Secondary | ICD-10-CM | POA: Insufficient documentation

## 2015-08-25 LAB — URINALYSIS, ROUTINE W REFLEX MICROSCOPIC
BILIRUBIN URINE: NEGATIVE
Glucose, UA: NEGATIVE mg/dL
HGB URINE DIPSTICK: NEGATIVE
KETONES UR: NEGATIVE mg/dL
Nitrite: NEGATIVE
Protein, ur: NEGATIVE mg/dL
SPECIFIC GRAVITY, URINE: 1.03 (ref 1.005–1.030)
UROBILINOGEN UA: 1 mg/dL (ref 0.0–1.0)
pH: 6 (ref 5.0–8.0)

## 2015-08-25 LAB — COMPREHENSIVE METABOLIC PANEL
ALBUMIN: 4.2 g/dL (ref 3.5–5.0)
ALT: 14 U/L (ref 14–54)
AST: 19 U/L (ref 15–41)
Alkaline Phosphatase: 47 U/L (ref 38–126)
Anion gap: 6 (ref 5–15)
BILIRUBIN TOTAL: 0.2 mg/dL — AB (ref 0.3–1.2)
BUN: 11 mg/dL (ref 6–20)
CALCIUM: 9.4 mg/dL (ref 8.9–10.3)
CO2: 23 mmol/L (ref 22–32)
Chloride: 109 mmol/L (ref 101–111)
Creatinine, Ser: 0.86 mg/dL (ref 0.44–1.00)
GFR calc Af Amer: >60 mL/min (ref >60)
Glucose, Bld: 104 mg/dL — ABNORMAL HIGH (ref 65–99)
Potassium: 3.9 mmol/L (ref 3.5–5.1)
Sodium: 138 mmol/L (ref 135–145)
TOTAL PROTEIN: 7.2 g/dL (ref 6.5–8.1)

## 2015-08-25 LAB — CBC
HEMATOCRIT: 38.5 % (ref 36.0–46.0)
Hemoglobin: 13 g/dL (ref 12.0–15.0)
MCH: 31.3 pg (ref 26.0–34.0)
MCHC: 33.8 g/dL (ref 30.0–36.0)
MCV: 92.8 fL (ref 78.0–100.0)
PLATELETS: 364 10*3/uL (ref 150–400)
RBC: 4.15 MIL/uL (ref 3.87–5.11)
RDW: 12.7 % (ref 11.5–15.5)
WBC: 6.3 10*3/uL (ref 4.0–10.5)

## 2015-08-25 LAB — URINE MICROSCOPIC-ADD ON

## 2015-08-25 LAB — LIPASE, BLOOD: Lipase: 24 U/L (ref 22–51)

## 2015-08-25 LAB — POC URINE PREG, ED: PREG TEST UR: NEGATIVE

## 2015-08-25 MED ORDER — KETOROLAC TROMETHAMINE 60 MG/2ML IM SOLN
60.0000 mg | Freq: Once | INTRAMUSCULAR | Status: AC
  Administered 2015-08-25: 60 mg via INTRAMUSCULAR
  Filled 2015-08-25: qty 2

## 2015-08-25 NOTE — ED Provider Notes (Signed)
CSN: [id number]     Arrival date & time 08/25/15  0006 History   First MD Initiated Contact with Patient 08/25/15 0214     Chief Complaint  Patient presents with  . Abdominal Pain     (Consider location/radiation/quality/duration/timing/severity/associated sxs/prior Treatment) Patient is a 39 y.o. female presenting with abdominal pain. The history is provided by the patient. No language interpreter was used.  Abdominal Pain Pain location:  RLQ Associated symptoms: no chills, no dysuria, no fever, no nausea, no vaginal discharge and no vomiting   Associated symptoms comment:  Patient complains of lower right abdominal pain radiating to the right lower back. The pain is the same pain she experiences every month, that she has had GYN evaluations for, and which she states has been diagnosed with endometriosis and fibroids. She denies vaginal discharge, dysuria, fever, vomiting.    Past Medical History  Diagnosis Date  . Bipolar 1 disorder, depressed, mild   . Migraines   . GERD (gastroesophageal reflux disease)   . Bilateral ovarian cysts   . Asthma   . Pregnancy induced hypertension   . HTN (hypertension)   . Anemia    Past Surgical History  Procedure Laterality Date  . Cesarean section    . Induced abortion    . Tubal ligation     Family History  Problem Relation Age of Onset  . Asthma Mother   . Asthma Brother    Social History  Substance Use Topics  . Smoking status: Former Smoker -- 0.50 packs/day    Types: Cigarettes    Quit date: 04/09/2014  . Smokeless tobacco: Never Used  . Alcohol Use: Yes     Comment: twice a year   OB History    Gravida Para Term Preterm AB TAB SAB Ectopic Multiple Living   3 2 0 1 1 1    2      Review of Systems  Constitutional: Negative for fever and chills.  Gastrointestinal: Positive for abdominal pain. Negative for nausea and vomiting.  Genitourinary: Negative for dysuria and vaginal discharge.  Musculoskeletal: Negative.   Skin:  Negative.   Neurological: Negative.       Allergies  Prednisone  Home Medications   Prior to Admission medications   Medication Sig Start Date End Date Taking? Authorizing Provider  albuterol (PROVENTIL HFA;VENTOLIN HFA) 108 (90 BASE) MCG/ACT inhaler Inhale 2 puffs into the lungs every 6 (six) hours as needed for wheezing or shortness of breath. 03/26/15  Yes Lance Bosch, NP  albuterol (PROVENTIL) (2.5 MG/3ML) 0.083% nebulizer solution Take 3 mLs (2.5 mg total) by nebulization every 4 (four) hours as needed for wheezing. 03/21/15  Yes Robbie Lis, MD  diphenhydramine-acetaminophen (TYLENOL PM) 25-500 MG TABS Take 2 tablets by mouth 2 (two) times daily as needed (pain).    Yes Historical Provider, MD  hydrochlorothiazide (HYDRODIURIL) 25 MG tablet Take 25 mg by mouth daily.   Yes Historical Provider, MD  HYDROcodone-acetaminophen (NORCO/VICODIN) 5-325 MG per tablet Take 1 tablet by mouth once.   Yes Historical Provider, MD  hydrocortisone cream 1 % Apply 1 application topically 2 (two) times daily.   Yes Historical Provider, MD  diazepam (VALIUM) 5 MG tablet Take 1 tablet (5 mg total) by mouth every 8 (eight) hours as needed for anxiety or muscle spasms. Patient not taking: Reported on 08/25/2015 06/30/15   Tatyana Kirichenko, PA-C  naproxen (NAPROSYN) 500 MG tablet Take 1 tablet (500 mg total) by mouth 2 (two) times daily. Patient not  taking: Reported on 08/25/2015 06/30/15   Tatyana Kirichenko, PA-C  traMADol (ULTRAM) 50 MG tablet Take 1 tablet (50 mg total) by mouth every 8 (eight) hours as needed. Patient not taking: Reported on 06/30/2015 03/26/15   Lance Bosch, NP   BP 133/79 mmHg  Pulse 102  Temp(Src) 98.1 F (36.7 C) (Oral)  Resp 18  SpO2 99%  LMP 07/21/2015 Physical Exam  Constitutional: She is oriented to person, place, and time. She appears well-developed and well-nourished.  HENT:  Head: Normocephalic.  Neck: Normal range of motion. Neck supple.  Cardiovascular: Normal  rate and regular rhythm.   Pulmonary/Chest: Effort normal and breath sounds normal.  Abdominal: Soft. Bowel sounds are normal. There is tenderness. There is no rebound and no guarding.  Musculoskeletal: Normal range of motion.  Neurological: She is alert and oriented to person, place, and time.  Skin: Skin is warm and dry. No rash noted.  Psychiatric: She has a normal mood and affect.    ED Course  Procedures (including critical care time) Labs Review Labs Reviewed  COMPREHENSIVE METABOLIC PANEL - Abnormal; Notable for the following:    Glucose, Bld 104 (*)    Total Bilirubin 0.2 (*)    All other components within normal limits  URINALYSIS, ROUTINE W REFLEX MICROSCOPIC (NOT AT [facility]) - Abnormal; Notable for the following:    APPearance TURBID (*)    Leukocytes, UA TRACE (*)    All other components within normal limits  LIPASE, BLOOD  CBC  URINE MICROSCOPIC-ADD ON  POC URINE PREG, ED    Imaging Review No results found. I have personally reviewed and evaluated these images and lab results as part of my medical decision-making.   EKG Interpretation None      MDM   Final diagnoses:  None    1. Chronic/recurrent abdominal pain  Lab studies are all normal - no leukocytosis, UTI. Offered pelvic exam but patient declined. Feel this is her chronic recurrent abdominal pain and patient agrees. Offered IM toradol and encouraged follow up with her PCP for further management of pain.    Charlann Lange, PA-C 08/25/15 0246  Everlene Balls, MD 08/25/15 [phone]

## 2015-08-25 NOTE — Discharge Instructions (Signed)

## 2015-08-25 NOTE — ED Notes (Signed)
Pt reports RLQ pain that radiates to her R leg.  Reports this pain has been going on for years.  States that she was told in the past that it was endometriosis and ovarian cyst.

## 2015-08-25 NOTE — ED Notes (Signed)
Pt ambulating independently w/ steady gait on d/c in no acute distress, A&Ox4.D/c instructions reviewed w/ pt and family - pt and family deny any further questions or concerns at present.  

## 2015-08-25 NOTE — ED Notes (Signed)
Pt w/ recurrent lower back pain that radiates to abd - denies n/v/d or vaginal bleeding/discharge. Pt in no acute distress, resting on stretcher.

## 2015-08-28 ENCOUNTER — Other Ambulatory Visit: Payer: Self-pay | Admitting: *Deleted

## 2015-08-28 DIAGNOSIS — N946 Dysmenorrhea, unspecified: Secondary | ICD-10-CM

## 2015-08-31 MED ORDER — TRAMADOL HCL 50 MG PO TABS: 50.0000 mg | ORAL_TABLET | Freq: Three times a day (TID) | ORAL | Status: DC | PRN

## 2015-12-05 ENCOUNTER — Encounter (HOSPITAL_COMMUNITY): Payer: Self-pay | Admitting: Emergency Medicine

## 2015-12-05 ENCOUNTER — Emergency Department (HOSPITAL_COMMUNITY)
Admission: EM | Admit: 2015-12-05 | Discharge: 2015-12-05 | Disposition: A | Discharge status: Home / Self Care | Payer: Self-pay | Attending: Emergency Medicine | Admitting: Emergency Medicine

## 2015-12-05 DIAGNOSIS — Z87891 Personal history of nicotine dependence: Secondary | ICD-10-CM | POA: Insufficient documentation

## 2015-12-05 DIAGNOSIS — I10 Essential (primary) hypertension: Secondary | ICD-10-CM | POA: Insufficient documentation

## 2015-12-05 DIAGNOSIS — Z79899 Other long term (current) drug therapy: Secondary | ICD-10-CM | POA: Insufficient documentation

## 2015-12-05 DIAGNOSIS — J45901 Unspecified asthma with (acute) exacerbation: Secondary | ICD-10-CM | POA: Insufficient documentation

## 2015-12-05 DIAGNOSIS — Z8742 Personal history of other diseases of the female genital tract: Secondary | ICD-10-CM | POA: Insufficient documentation

## 2015-12-05 DIAGNOSIS — Z862 Personal history of diseases of the blood and blood-forming organs and certain disorders involving the immune mechanism: Secondary | ICD-10-CM | POA: Insufficient documentation

## 2015-12-05 DIAGNOSIS — H53149 Visual discomfort, unspecified: Secondary | ICD-10-CM | POA: Insufficient documentation

## 2015-12-05 DIAGNOSIS — Z8719 Personal history of other diseases of the digestive system: Secondary | ICD-10-CM | POA: Insufficient documentation

## 2015-12-05 DIAGNOSIS — Z8659 Personal history of other mental and behavioral disorders: Secondary | ICD-10-CM | POA: Insufficient documentation

## 2015-12-05 DIAGNOSIS — R51 Headache: Secondary | ICD-10-CM | POA: Insufficient documentation

## 2015-12-05 MED ORDER — ALBUTEROL SULFATE (2.5 MG/3ML) 0.083% IN NEBU
5.0000 mg | INHALATION_SOLUTION | Freq: Once | RESPIRATORY_TRACT | Status: AC
  Administered 2015-12-05: 5 mg via RESPIRATORY_TRACT
  Filled 2015-12-05: qty 6

## 2015-12-05 MED ORDER — BENZONATATE 100 MG PO CAPS
100.0000 mg | ORAL_CAPSULE | Freq: Once | ORAL | Status: AC
  Administered 2015-12-05: 100 mg via ORAL
  Filled 2015-12-05: qty 1

## 2015-12-05 MED ORDER — LORATADINE 10 MG PO TABS
10.0000 mg | ORAL_TABLET | Freq: Once | ORAL | Status: AC
  Administered 2015-12-05: 10 mg via ORAL
  Filled 2015-12-05: qty 1

## 2015-12-05 MED ORDER — NAPROXEN 500 MG PO TABS: 500.0000 mg | ORAL_TABLET | Freq: Two times a day (BID) | ORAL | Status: DC

## 2015-12-05 MED ORDER — KETOROLAC TROMETHAMINE 60 MG/2ML IM SOLN
60.0000 mg | Freq: Once | INTRAMUSCULAR | Status: AC
  Administered 2015-12-05: 60 mg via INTRAMUSCULAR
  Filled 2015-12-05: qty 2

## 2015-12-05 MED ORDER — BENZONATATE 100 MG PO CAPS: 100.0000 mg | ORAL_CAPSULE | Freq: Three times a day (TID) | ORAL | Status: DC | PRN

## 2015-12-05 MED ORDER — IPRATROPIUM BROMIDE 0.02 % IN SOLN
0.5000 mg | Freq: Once | RESPIRATORY_TRACT | Status: AC
  Administered 2015-12-05: 0.5 mg via RESPIRATORY_TRACT
  Filled 2015-12-05: qty 2.5

## 2015-12-05 MED ORDER — DEXAMETHASONE SODIUM PHOSPHATE 10 MG/ML IJ SOLN
10.0000 mg | Freq: Once | INTRAMUSCULAR | Status: AC
  Administered 2015-12-05: 10 mg via INTRAMUSCULAR
  Filled 2015-12-05: qty 1

## 2015-12-05 NOTE — ED Provider Notes (Signed)
CSN: [id number]     Arrival date & time 12/05/15  0230 History   First MD Initiated Contact with Patient 12/05/15 0236     Chief Complaint  Patient presents with  . Asthma     (Consider location/radiation/quality/duration/timing/severity/associated sxs/prior Treatment) HPI Comments: 39 year old female with a history of migraine headaches, asthma, hypertension, and bipolar 1 disorder presents to the emergency department for complaints of shortness of breath. Patient states that she awoke at 2200 complaining of chest tightness with shortness of breath. She states that her symptoms felt similar to her past asthma exacerbations. She reports using her albuterol inhaler as well as giving herself one nebulizer treatment without significant improvement. She states that she had a mild dry cough yesterday. Cough has been productive of some clear phlegm since this time. She reports a pain on the top of her head with coughing. She states that this pain began following her "asthma attack". Symptoms associated with photophobia. She also reports a mild central sharp chest pain with coughing. No associated fever, lightheadedness, dizziness, syncope, vomiting, or weakness.  Patient is a 39 y.o. female presenting with asthma. The history is provided by the patient. No language interpreter was used.  Asthma Associated symptoms include coughing and headaches. Pertinent negatives include no congestion, fever, numbness, vomiting or weakness.    Past Medical History  Diagnosis Date  . Bipolar 1 disorder, depressed, mild ([place])   . Migraines   . GERD (gastroesophageal reflux disease)   . Bilateral ovarian cysts   . Asthma   . Pregnancy induced hypertension   . HTN (hypertension)   . Anemia    Past Surgical History  Procedure Laterality Date  . Cesarean section    . Induced abortion    . Tubal ligation     Family History  Problem Relation Age of Onset  . Asthma Mother   . Asthma Brother    Social  History  Substance Use Topics  . Smoking status: Former Smoker -- 0.50 packs/day    Types: Cigarettes    Quit date: 04/09/2014  . Smokeless tobacco: Never Used  . Alcohol Use: Yes     Comment: twice a year   OB History    Gravida Para Term Preterm AB TAB SAB Ectopic Multiple Living   3 2 0 1 1 1    2       Review of Systems  Constitutional: Negative for fever.  HENT: Negative for congestion and rhinorrhea.   Eyes: Positive for photophobia.  Respiratory: Positive for cough, chest tightness and shortness of breath.   Gastrointestinal: Negative for vomiting.  Neurological: Positive for headaches. Negative for syncope, weakness, light-headedness and numbness.  All other systems reviewed and are negative.   Allergies  Vicodin and Prednisone  Home Medications   Prior to Admission medications   Medication Sig Start Date End Date Taking? Authorizing Provider  albuterol (PROVENTIL HFA;VENTOLIN HFA) 108 (90 BASE) MCG/ACT inhaler Inhale 2 puffs into the lungs every 6 (six) hours as needed for wheezing or shortness of breath. 03/26/15  Yes Lance Bosch, NP  albuterol (PROVENTIL) (2.5 MG/3ML) 0.083% nebulizer solution Take 3 mLs (2.5 mg total) by nebulization every 4 (four) hours as needed for wheezing. 03/21/15  Yes Robbie Lis, MD  diphenhydramine-acetaminophen (TYLENOL PM) 25-500 MG TABS Take 2 tablets by mouth 2 (two) times daily as needed (pain).    Yes Historical Provider, MD  hydrochlorothiazide (HYDRODIURIL) 25 MG tablet Take 25 mg by mouth daily.   Yes Historical Provider,  MD  Oxycodone HCl 10 MG TABS Take 10 mg by mouth every 8 (eight) hours as needed (pain).   Yes Historical Provider, MD  benzonatate (TESSALON) 100 MG capsule Take 1 capsule (100 mg total) by mouth 3 (three) times daily as needed for cough. 12/05/15   Antonietta Breach, PA-C  diazepam (VALIUM) 5 MG tablet Take 1 tablet (5 mg total) by mouth every 8 (eight) hours as needed for anxiety or muscle spasms. Patient not taking:  Reported on 08/25/2015 06/30/15   Tatyana Kirichenko, PA-C  naproxen (NAPROSYN) 500 MG tablet Take 1 tablet (500 mg total) by mouth 2 (two) times daily. 12/05/15   Antonietta Breach, PA-C  traMADol (ULTRAM) 50 MG tablet Take 1 tablet (50 mg total) by mouth every 8 (eight) hours as needed. Patient not taking: Reported on 12/05/2015 08/31/15   Lance Bosch, NP   BP 151/87 mmHg  Pulse 96  Temp(Src) 98.1 F (36.7 C) (Oral)  Resp 22  SpO2 98%  LMP 11/28/2015 (Exact Date)   Physical Exam  Constitutional: She is oriented to person, place, and time. She appears well-developed and well-nourished. No distress.  Nontoxic/nonseptic appearing  HENT:  Head: Normocephalic and atraumatic.  Eyes: Conjunctivae and EOM are normal. No scleral icterus.  Neck: Normal range of motion.  No nuchal rigidity or meningismus.  Cardiovascular: Normal rate, regular rhythm and intact distal pulses.   Pulmonary/Chest: Effort normal. No respiratory distress. She has wheezes. She has no rales.  Faint wheezing at the end of expiration. Chest expansion symmetric. No rales or rhonchi. No accessory muscle use.  Musculoskeletal: Normal range of motion.  Neurological: She is alert and oriented to person, place, and time. She exhibits normal muscle tone. Coordination normal.  GCS 15. No focal deficits noted. Patient moving all extremities.  Skin: Skin is warm and dry. No rash noted. She is not diaphoretic. No erythema. No pallor.  Psychiatric: She has a normal mood and affect. Her behavior is normal.  Nursing note and vitals reviewed.   ED Course  Procedures (including critical care time) Labs Review Labs Reviewed - No data to display  Imaging Review No results found.   I have personally reviewed and evaluated these images and lab results as part of my medical decision-making.   EKG Interpretation None       3:59 AM Lung sounds improved on reexamination. No wheezing noted. No rales or rhonchi. Patient states headache  has improved as well. She expresses comfort in managing her symptoms further as an outpatient.  MDM   Final diagnoses:  Asthma exacerbation    39 year old female presents to the Emergency Department complaining of chest tightness and shortness of breath which woke her from sleep at 2200. Patient states that symptoms feel c/w prior asthma exacerbations. This was unrelieved at home with albuterol treatment and rescue inhaler. Patient with faint wheezing at the end of her expiratory phase. Her chest expansion is symmetric. She has no fever, tachypnea, dyspnea, or hypoxia to suggest pneumonia. No rales or rhonchi noted on exam.  Symptoms treated with Claritin as well as Toradol, Decadron, and a DuoNeb treatment. On reevaluation, patient states that she feels much improved and that she is now breathing at baseline. She expresses comfort in managing her symptoms further on an outpatient basis. I do not believe any further emergent workup is indicated at this time. Patient stable for discharge with instructions to follow-up with her primary care doctor. Return precautions provided. Patient discharged in good condition with no unaddressed  concerns.   Filed Vitals:   12/05/15 0236  BP: 151/87  Pulse: 96  Temp: 98.1 F (36.7 C)  TempSrc: Oral  Resp: 22  SpO2: 98%     Antonietta Breach, PA-C 12/05/15 [id number]  Varney Biles, MD 12/05/15 2213

## 2015-12-05 NOTE — ED Notes (Signed)
Pt states she had a bad asthma attack around 2200 tonight  Pt states she used her emergency inhaler and has had a neb tx since then but continues to cough and feels like she needs to cough up phlegm  Pt states everytime she coughs she has a bad pain in the top of her head

## 2015-12-05 NOTE — Discharge Instructions (Signed)

## 2015-12-24 ENCOUNTER — Other Ambulatory Visit: Payer: Self-pay | Admitting: Obstetrics & Gynecology

## 2015-12-24 DIAGNOSIS — N63 Unspecified lump in unspecified breast: Secondary | ICD-10-CM

## 2015-12-29 ENCOUNTER — Ambulatory Visit
Admission: RE | Admit: 2015-12-29 | Discharge: 2015-12-29 | Disposition: A | Discharge status: Home / Self Care | Payer: BLUE CROSS/BLUE SHIELD | Source: Ambulatory Visit | Attending: Obstetrics & Gynecology | Admitting: Obstetrics & Gynecology

## 2015-12-29 DIAGNOSIS — N63 Unspecified lump in unspecified breast: Secondary | ICD-10-CM

## 2016-01-01 ENCOUNTER — Other Ambulatory Visit: Payer: Self-pay | Admitting: Internal Medicine

## 2016-01-01 MED FILL — VENTOLIN HFA 90 MCG INHALER: 108 (90 BAS | 25 days supply | Qty: 18 | Fill #1

## 2016-01-05 ENCOUNTER — Other Ambulatory Visit: Payer: Self-pay | Admitting: Obstetrics & Gynecology

## 2016-01-08 ENCOUNTER — Ambulatory Visit: Admit: 2016-01-08 | Payer: BLUE CROSS/BLUE SHIELD | Admitting: Obstetrics & Gynecology

## 2016-01-08 SURGERY — LAPAROSCOPY OPERATIVE
Anesthesia: General

## 2016-01-18 ENCOUNTER — Other Ambulatory Visit: Payer: Self-pay | Admitting: Internal Medicine

## 2016-01-18 NOTE — Telephone Encounter (Signed)
Patient is requesting a prescription refill for Tramadol.Marland KitchenMarland KitchenPlease call patient

## 2016-02-01 MED FILL — VENTOLIN HFA 90 MCG INHALER: 108 (90 BAS | 25 days supply | Qty: 18 | Fill #0

## 2016-03-29 ENCOUNTER — Encounter (HOSPITAL_COMMUNITY): Payer: Self-pay | Admitting: *Deleted

## 2016-03-29 ENCOUNTER — Inpatient Hospital Stay (HOSPITAL_COMMUNITY)
Admission: AD | Admit: 2016-03-29 | Discharge: 2016-03-29 | Disposition: A | Discharge status: Home / Self Care | Payer: BLUE CROSS/BLUE SHIELD | Source: Ambulatory Visit | Attending: Obstetrics and Gynecology | Admitting: Obstetrics and Gynecology

## 2016-03-29 DIAGNOSIS — K219 Gastro-esophageal reflux disease without esophagitis: Secondary | ICD-10-CM | POA: Insufficient documentation

## 2016-03-29 DIAGNOSIS — F319 Bipolar disorder, unspecified: Secondary | ICD-10-CM | POA: Insufficient documentation

## 2016-03-29 DIAGNOSIS — J45901 Unspecified asthma with (acute) exacerbation: Secondary | ICD-10-CM | POA: Insufficient documentation

## 2016-03-29 DIAGNOSIS — D259 Leiomyoma of uterus, unspecified: Secondary | ICD-10-CM | POA: Diagnosis not present

## 2016-03-29 DIAGNOSIS — I10 Essential (primary) hypertension: Secondary | ICD-10-CM | POA: Insufficient documentation

## 2016-03-29 DIAGNOSIS — Z87891 Personal history of nicotine dependence: Secondary | ICD-10-CM | POA: Insufficient documentation

## 2016-03-29 DIAGNOSIS — G43909 Migraine, unspecified, not intractable, without status migrainosus: Secondary | ICD-10-CM | POA: Insufficient documentation

## 2016-03-29 DIAGNOSIS — R109 Unspecified abdominal pain: Secondary | ICD-10-CM | POA: Diagnosis not present

## 2016-03-29 DIAGNOSIS — E876 Hypokalemia: Secondary | ICD-10-CM | POA: Diagnosis not present

## 2016-03-29 DIAGNOSIS — N939 Abnormal uterine and vaginal bleeding, unspecified: Secondary | ICD-10-CM | POA: Diagnosis present

## 2016-03-29 DIAGNOSIS — N83202 Unspecified ovarian cyst, left side: Secondary | ICD-10-CM | POA: Diagnosis not present

## 2016-03-29 LAB — URINALYSIS, ROUTINE W REFLEX MICROSCOPIC
GLUCOSE, UA: NEGATIVE mg/dL
Ketones, ur: NEGATIVE mg/dL
LEUKOCYTES UA: NEGATIVE
Nitrite: POSITIVE — AB
PH: 5 (ref 5.0–8.0)
PROTEIN: 100 mg/dL — AB
Specific Gravity, Urine: >1.03 — ABNORMAL HIGH (ref 1.005–1.030)

## 2016-03-29 LAB — URINE MICROSCOPIC-ADD ON: WBC UA: NONE SEEN WBC/hpf (ref 0–5)

## 2016-03-29 LAB — POCT PREGNANCY, URINE: Preg Test, Ur: NEGATIVE

## 2016-03-29 MED ORDER — KETOROLAC TROMETHAMINE 30 MG/ML IJ SOLN
30.0000 mg | Freq: Once | INTRAMUSCULAR | Status: AC
  Administered 2016-03-29: 30 mg via INTRAMUSCULAR
  Filled 2016-03-29: qty 1

## 2016-03-29 MED ORDER — KETOROLAC TROMETHAMINE 10 MG PO TABS: 10.0000 mg | ORAL_TABLET | Freq: Four times a day (QID) | ORAL | Status: AC | PRN

## 2016-03-29 NOTE — MAU Provider Note (Signed)
History    [name] is a 40y.o female who presents, unannounced, for pain and bleeding.  Patient states she started cramping and pain started on the 1st of April along with menses. Patient states complete saturation of regular pads which she changes every 45-60 minutes.  Patient reports passing of clots into the toilet, but none noted on pads.  Patient states that she presents today for right side abdominal pain and leg weakness.  Patient also reports bruising on her legs that has been present since the 9th of last month (right leg) and 3-4 days ago on the left leg.  Patient reports some discomfort with ambulation, due to pain, and describes as "flexing muscles."  Patient reports taking (2) tylenol pm every night since Sunday and 1 pill every 8-10 hrs throughout the day.  Patient also taking tramadol twice daily.  Patient states she gets minimal relief from medications and instead has good results with soaking in hot water. Patient also taking birth control pills, but does not recall type.  She last took pill on the 1st of the month. Patient also prescribed pain medication, oxycodone 10, but states she does not like the way it makes her feel.  Patient reports last dosage about a week ago. Patient also takes HCTZ every evening and last does was yesterday.  Patient further states she was suppose to have a surgery, but has to secure her 30-40% prior to the procedure.  Patient further states she has not talked or discussed POC with Dr. Wilma Flavin.    Patient Active Problem List   Diagnosis Date Noted  . Acute bronchitis   . Hypokalemia   . HTN (hypertension) 09/02/2013  . Asthma exacerbation 09/01/2013  . Tobacco abuse 09/01/2013    Chief Complaint  Patient presents with  . Abdominal Pain   HPI  OB History    Gravida Para Term Preterm AB TAB SAB Ectopic Multiple Living   3 2 0 1 1 1    2       Past Medical History  Diagnosis Date  . Bipolar 1 disorder, depressed, mild ([place])   . Migraines   .  GERD (gastroesophageal reflux disease)   . Bilateral ovarian cysts   . Asthma   . Pregnancy induced hypertension   . HTN (hypertension)   . Anemia     Past Surgical History  Procedure Laterality Date  . Cesarean section    . Induced abortion    . Tubal ligation      Family History  Problem Relation Age of Onset  . Asthma Mother   . Asthma Brother     Social History  Substance Use Topics  . Smoking status: Former Smoker -- 0.50 packs/day    Types: Cigarettes    Quit date: 04/09/2014  . Smokeless tobacco: Never Used  . Alcohol Use: Yes     Comment: twice a year    Allergies:  Allergies  Allergen Reactions  . Vicodin [Hydrocodone-Acetaminophen] Hives  . Prednisone Swelling, Rash and Other (See Comments)    Pt states that this medication causes her legs to swell.  Pt also states that she does not have any problems with the IV form of this medication.      Prescriptions prior to admission  Medication Sig Dispense Refill Last Dose  . albuterol (PROVENTIL) (2.5 MG/3ML) 0.083% nebulizer solution Take 3 mLs (2.5 mg total) by nebulization every 4 (four) hours as needed for wheezing. 75 mL 1 PRN at PRN  . albuterol (  VENTOLIN HFA) 108 (90 Base) MCG/ACT inhaler Inhale 2 puffs into the lungs every 6 (six) hours as needed for wheezing or shortness of breath. Needs office visit 18 g 2   . benzonatate (TESSALON) 100 MG capsule Take 1 capsule (100 mg total) by mouth 3 (three) times daily as needed for cough. 21 capsule 0 PRN at PRN  . diazepam (VALIUM) 5 MG tablet Take 1 tablet (5 mg total) by mouth every 8 (eight) hours as needed for anxiety or muscle spasms. (Patient not taking: Reported on 08/25/2015) 15 tablet 0 Completed Course at Unknown time  . diphenhydramine-acetaminophen (TYLENOL PM) 25-500 MG TABS Take 2 tablets by mouth 2 (two) times daily as needed (for pain).    PRN at PRN  . hydrochlorothiazide (HYDRODIURIL) 25 MG tablet Take 25 mg by mouth daily.   unknown at unknown  .  naproxen (NAPROSYN) 500 MG tablet Take 1 tablet (500 mg total) by mouth 2 (two) times daily. (Patient not taking: Reported on 01/02/2016) 30 tablet 0   . Oxycodone HCl 10 MG TABS Take 10 mg by mouth every 8 (eight) hours as needed (for pain).    PRN at PRN  . traMADol (ULTRAM) 50 MG tablet TAKE ONE TABLET BY MOUTH EVERY 8 HOURS AS NEEDED 60 tablet 0     ROS  See HPI Above Physical Exam   Blood pressure 128/82, pulse 75, temperature 98.2 F (36.8 C), temperature source Oral, resp. rate 20, last menstrual period 03/27/2016.  Results for orders placed or performed during the hospital encounter of 03/29/16 (from the past 24 hour(s))  Urinalysis, Routine w reflex microscopic (not at [facility])     Status: Abnormal   Collection Time: 03/29/16  9:00 AM  Result Value Ref Range   Color, Urine RED (A) YELLOW   APPearance CLOUDY (A) CLEAR   Specific Gravity, Urine >1.030 (H) 1.005 - 1.030   pH 5.0 5.0 - 8.0   Glucose, UA NEGATIVE NEGATIVE mg/dL   Hgb urine dipstick LARGE (A) NEGATIVE   Bilirubin Urine SMALL (A) NEGATIVE   Ketones, ur NEGATIVE NEGATIVE mg/dL   Protein, ur 100 (A) NEGATIVE mg/dL   Nitrite POSITIVE (A) NEGATIVE   Leukocytes, UA NEGATIVE NEGATIVE  Urine microscopic-add on     Status: Abnormal   Collection Time: 03/29/16  9:00 AM  Result Value Ref Range   Squamous Epithelial / LPF 0-5 (A) NONE SEEN   WBC, UA NONE SEEN 0 - 5 WBC/hpf   RBC / HPF TOO NUMEROUS TO COUNT 0 - 5 RBC/hpf   Bacteria, UA RARE (A) NONE SEEN  Pregnancy, urine POC     Status: None   Collection Time: 03/29/16  9:18 AM  Result Value Ref Range   Preg Test, Ur NEGATIVE NEGATIVE    Physical Exam  Constitutional: She is oriented to person, place, and time. She appears well-developed and well-nourished. She appears distressed.  HENT:  Head: Normocephalic and atraumatic.  Eyes: Conjunctivae are normal.  Neck: Normal range of motion.  Cardiovascular: Normal rate, regular rhythm and normal heart sounds.    Respiratory: Effort normal and breath sounds normal.  GI: Soft. Bowel sounds are normal.  Genitourinary: Uterus is enlarged and tender. Cervix exhibits motion tenderness. Cervix exhibits no friability. There is bleeding in the vagina.  Sterile Speculum Exam: -Vaginal Vault: Scant amt bright red blood -Cervix:Small clots from os -Bimanual Exam: Closed. Right side Tender. Mild CMT   Musculoskeletal: Normal range of motion.  Neurological: She is alert and oriented  to person, place, and time.  Skin: Skin is warm and dry.  Psychiatric: She has a normal mood and affect.     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 ND consulted and advised: *Toradol 30mg  IM *Toradol 10mg  PO Q6hrs x 7 days *Follow up in office, with Dr. Alesia Richards, for further mgmt -In room to discuss POC with patient -Q/C addressed -Will discharge home  -Schedule follow up accordingly  Maryann Conners CNM, MSN 03/29/2016 9:14 AM

## 2016-03-29 NOTE — MAU Note (Addendum)
abd pain, started on the first. Has gotten worse, radiating into legs; noted bruising behind knees.  Hx of ovarian cysts, endometriosis, recently dx with several fibroids.  usually goes to pain management, but looking for new dr

## 2016-03-29 NOTE — Discharge Instructions (Signed)
Dysmenorrhea °Menstrual cramps (dysmenorrhea) are caused by the muscles of the uterus tightening (contracting) during a menstrual period. For some women, this discomfort is merely bothersome. For others, dysmenorrhea can be severe enough to interfere with everyday activities for a few days each month. °Primary dysmenorrhea is menstrual cramps that last a couple of days when you start having menstrual periods or soon after. This often begins after a teenager starts having her period. As a woman gets older or has a baby, the cramps will usually lessen or disappear. Secondary dysmenorrhea begins later in life, lasts longer, and the pain may be stronger than primary dysmenorrhea. The pain may start before the period and last a few days after the period.  °CAUSES  °Dysmenorrhea is usually caused by an underlying problem, such as: °· The tissue lining the uterus grows outside of the uterus in other areas of the body (endometriosis). °· The endometrial tissue, which normally lines the uterus, is found in or grows into the muscular walls of the uterus (adenomyosis). °· The pelvic blood vessels are engorged with blood just before the menstrual period (pelvic congestive syndrome). °· Overgrowth of cells (polyps) in the lining of the uterus or cervix. °· Falling down of the uterus (prolapse) because of loose or stretched ligaments. °· Depression. °· Bladder problems, infection, or inflammation. °· Problems with the intestine, a tumor, or irritable bowel syndrome. °· Cancer of the female organs or bladder. °· A severely tipped uterus. °· A very tight opening or closed cervix. °· Noncancerous tumors of the uterus (fibroids). °· Pelvic inflammatory disease (PID). °· Pelvic scarring (adhesions) from a previous surgery. °· Ovarian cyst. °· An intrauterine device (IUD) used for birth control. °RISK FACTORS °You may be at greater risk of dysmenorrhea if: °· You are younger than age 30. °· You started puberty early. °· You have  irregular or heavy bleeding. °· You have never given birth. °· You have a family history of this problem. °· You are a smoker. °SIGNS AND SYMPTOMS  °· Cramping or throbbing pain in your lower abdomen. °· Headaches. °· Lower back pain. °· Nausea or vomiting. °· Diarrhea. °· Sweating or dizziness. °· Loose stools. °DIAGNOSIS  °A diagnosis is based on your history, symptoms, physical exam, diagnostic tests, or procedures. Diagnostic tests or procedures may include: °· Blood tests. °· Ultrasonography. °· An examination of the lining of the uterus (dilation and curettage, D&C). °· An examination inside your abdomen or pelvis with a scope (laparoscopy). °· X-rays. °· CT scan. °· MRI. °· An examination inside the bladder with a scope (cystoscopy). °· An examination inside the intestine or stomach with a scope (colonoscopy, gastroscopy). °TREATMENT  °Treatment depends on the cause of the dysmenorrhea. Treatment may include: °· Pain medicine prescribed by your health care provider. °· Birth control pills or an IUD with progesterone hormone in it. °· Hormone replacement therapy. °· Nonsteroidal anti-inflammatory drugs (NSAIDs). These may help stop the production of prostaglandins. °· Surgery to remove adhesions, endometriosis, ovarian cyst, or fibroids. °· Removal of the uterus (hysterectomy). °· Progesterone shots to stop the menstrual period. °· Cutting the nerves on the sacrum that go to the female organs (presacral neurectomy). °· Electric current to the sacral nerves (sacral nerve stimulation). °· Antidepressant medicine. °· Psychiatric therapy, counseling, or group therapy. °· Exercise and physical therapy. °· Meditation and yoga therapy. °· Acupuncture. °HOME CARE INSTRUCTIONS  °· Only take over-the-counter or prescription medicines as directed by your health care provider. °· Place a heating pad   or hot water bottle on your lower back or abdomen. Do not sleep with the heating pad.  Use aerobic exercises, walking,  swimming, biking, and other exercises to help lessen the cramping.  Massage to the lower back or abdomen may help.  Stop smoking.  Avoid alcohol and caffeine. SEEK MEDICAL CARE IF:   Your pain does not get better with medicine.  You have pain with sexual intercourse.  Your pain increases and is not controlled with medicines.  You have abnormal vaginal bleeding with your period.  You develop nausea or vomiting with your period that is not controlled with medicine. SEEK IMMEDIATE MEDICAL CARE IF:  You pass out.    This information is not intended to replace advice given to you by your health care provider. Make sure you discuss any questions you have with your health care provider.   Document Released: 12/05/2005 Document Revised: 08/07/2013 Document Reviewed: 05/23/2013 Elsevier Interactive Patient Education 2016 Else[employer] Myomectomy Myomectomy is surgery to remove a noncancerous tumor (myoma) from the uterus. Myomas are tumors made up of fibrous tissue. They are often called fibroid tumors. Fibroid tumors can range from the size of a pea to the size of a grapefruit. In a myomectomy, the fibroid tumor is removed without removing the uterus. Because these tumors are rarely cancerous, this surgery is usually done only if the tumor is growing or causing symptoms such as pain, pressure, bleeding, or pain with intercourse. LET [facility] CARE PROVIDER KNOW ABOUT:  Any allergies you have.  All medicines you are taking, including vitamins, herbs, eye drops, creams, and over-the-counter medicines.  Previous problems you or members of your family have had with the use of anesthetics.  Any blood disorders you have.  Previous surgeries you have had.  Medical conditions you have. RISKS AND COMPLICATIONS  Generally, this is a safe procedure. However, as with any procedure, complications can occur. Possible complications include:  Excessive bleeding.  Infection.  Injury to  nearby organs.  Blood clots in the legs, chest, or brain.  Scar tissue on other organs and in the pelvis. This may require another surgery to remove the scar tissue. BEFORE THE PROCEDURE  Ask your health care provider about changing or stopping your regular medicines. Avoid taking aspirin or blood thinners as directed by your health care provider.  Do not  eat or drink anything after midnight on the night before surgery.  If you smoke, do not  smoke for 2 weeks before the surgery.  Do not  drink alcohol the day before the surgery.  Arrange for someone to drive you home after the procedure or after your hospital stay. Also arrange for someone to help you with activities during your recovery. PROCEDURE You will be given medicine to make you sleep through the procedure (general anesthetic). Any of the following methods may be used to perform a myomectomy:  Small monitors will be put on your body. They are used to check your heart, blood pressure, and oxygen level.  An IV access tube will be put into one of your veins. Medicine will be able to flow directly into your body through this IV tube.  You might be given a medicine to help you relax (sedative).  You will be given a medicine to make you sleep (general anesthetic). A breathing tube will be placed into your lungs during the procedure.  A thin, flexible tube (catheter) will be inserted into your bladder to collect urine.  Any of the following methods  may be used to perform a myomectomy:  Hysteroscopic myomectomy--This method may be used when the fibroid tumor is inside the cavity of the uterus. A long, thin tube that is like a telescope (hysteroscope) is inserted inside the uterus. A saline solution is put into your uterus. This expands the uterus and allows the surgeon to see the fibroids. Tools are passed through the hysteroscope to remove the fibroid tumor in pieces.  Laparoscopic myomectomy--A few small cuts (incisions) are made  in the lower abdomen. A thin, lighted tube with a tiny camera on the end (laparoscope) is inserted through one of the incisions. This gives the surgeon a good view of the area. The fibroid tumor is removed through the other incisions. The incisions are then closed with stitches (sutures) or staples.  Abdominal myomectomy--This method is used when the fibroid tumor cannot be removed with a hysteroscope or laparoscope. The surgery is performed through a larger surgical incision in the abdomen. The fibroid tumor is removed through this incision. The incision is closed with sutures or staples. AFTER THE PROCEDURE  If you had a laparoscopic or hysteroscopic myomectomy, you may be able to go home the same day, or you may need to stay in the hospital overnight.  If you had an abdominal myomectomy, you may need to stay in the hospital for a few days.  Your IV access tube and catheter will be removed in 1-2 days.  You may be given medicine for pain or to help you sleep.  You may be given an antibiotic medicine, if needed.   This information is not intended to replace advice given to you by your health care provider. Make sure you discuss any questions you have with your health care provider.   Document Released: 10/02/2007 Document Revised: 09/25/2013 Document Reviewed: 07/17/2013 Elsevier Interactive Patient Education [employer].

## 2016-04-22 NOTE — Patient Instructions (Addendum)
Your procedure is scheduled on:  Thursday, May 05, 2016  Enter through the Main Entrance of [facility] at:  7:00 AM  Pick up the phone at the desk and dial [phone].  Call this number if you have problems the morning of surgery: [phone].  Remember:  Do NOT eat food or drink after:  Midnight Wednesday, May 04, 2016  Take these medicines the morning of surgery with a SIP OF WATER:  Hydrochlorothiazide  Bring Asthma inhaler day of surgery.  Do NOT smoke the day of surgery.  Do NOT wear jewelry (body piercing), metal hair clips/bobby pins, make-up, or nail polish. Do NOT wear lotions, powders, or perfumes.  You may wear deodorant. Do NOT shave for 48 hours prior to surgery. Do NOT bring valuables to the hospital. Contacts, dentures, or bridgework may not be worn into surgery.  Leave suitcase in car.  After surgery it may be brought to your room.  For patients admitted to the hospital, checkout time is 11:00 AM the day of discharge.

## 2016-04-25 ENCOUNTER — Encounter (HOSPITAL_COMMUNITY): Payer: Self-pay

## 2016-04-25 ENCOUNTER — Other Ambulatory Visit: Payer: Self-pay | Admitting: Obstetrics & Gynecology

## 2016-04-25 ENCOUNTER — Encounter (HOSPITAL_COMMUNITY)
Admission: RE | Admit: 2016-04-25 | Discharge: 2016-04-25 | Disposition: A | Discharge status: Home / Self Care | Payer: BLUE CROSS/BLUE SHIELD | Source: Ambulatory Visit | Attending: Obstetrics & Gynecology | Admitting: Obstetrics & Gynecology

## 2016-04-25 DIAGNOSIS — J45909 Unspecified asthma, uncomplicated: Secondary | ICD-10-CM | POA: Diagnosis not present

## 2016-04-25 DIAGNOSIS — D259 Leiomyoma of uterus, unspecified: Secondary | ICD-10-CM | POA: Diagnosis not present

## 2016-04-25 DIAGNOSIS — K219 Gastro-esophageal reflux disease without esophagitis: Secondary | ICD-10-CM | POA: Insufficient documentation

## 2016-04-25 DIAGNOSIS — I10 Essential (primary) hypertension: Secondary | ICD-10-CM | POA: Diagnosis not present

## 2016-04-25 DIAGNOSIS — D649 Anemia, unspecified: Secondary | ICD-10-CM | POA: Insufficient documentation

## 2016-04-25 DIAGNOSIS — Z01812 Encounter for preprocedural laboratory examination: Secondary | ICD-10-CM | POA: Insufficient documentation

## 2016-04-25 DIAGNOSIS — F319 Bipolar disorder, unspecified: Secondary | ICD-10-CM | POA: Diagnosis not present

## 2016-04-25 DIAGNOSIS — Z87891 Personal history of nicotine dependence: Secondary | ICD-10-CM | POA: Diagnosis not present

## 2016-04-25 DIAGNOSIS — R102 Pelvic and perineal pain: Secondary | ICD-10-CM | POA: Diagnosis not present

## 2016-04-25 HISTORY — DX: Personal history of other medical treatment: Z92.89

## 2016-04-25 HISTORY — DX: Unspecified convulsions: R56.9

## 2016-04-25 LAB — CBC
HCT: 41.4 % (ref 36.0–46.0)
Hemoglobin: 13.9 g/dL (ref 12.0–15.0)
MCH: 31.1 pg (ref 26.0–34.0)
MCHC: 33.6 g/dL (ref 30.0–36.0)
MCV: 92.6 fL (ref 78.0–100.0)
PLATELETS: 389 10*3/uL (ref 150–400)
RBC: 4.47 MIL/uL (ref 3.87–5.11)
RDW: 13.2 % (ref 11.5–15.5)
WBC: 7.5 10*3/uL (ref 4.0–10.5)

## 2016-04-25 LAB — BASIC METABOLIC PANEL
Anion gap: 7 (ref 5–15)
BUN: 6 mg/dL (ref 6–20)
CALCIUM: 8.8 mg/dL — AB (ref 8.9–10.3)
CO2: 25 mmol/L (ref 22–32)
Chloride: 107 mmol/L (ref 101–111)
Creatinine, Ser: 0.82 mg/dL (ref 0.44–1.00)
GFR calc Af Amer: >60 mL/min (ref >60)
GLUCOSE: 104 mg/dL — AB (ref 65–99)
Potassium: 4 mmol/L (ref 3.5–5.1)
Sodium: 139 mmol/L (ref 135–145)

## 2016-04-25 LAB — ABO/RH: ABO/RH(D): O POS

## 2016-04-26 LAB — TYPE AND SCREEN
ABO/RH(D): O POS
Antibody Screen: NEGATIVE

## 2016-05-03 NOTE — H&P (Addendum)
[name] is an 40 y.o. female P2 with chronic pelvic pain, fibroid uterus here for exploratory laparotomy, abdominal myomectomy, possible removal of endometriosis if present, possible removal of tubes and possible removal of appendix if they are found to be abnormal.   Pelvic Ultrasound 12/25/2015: Uterus 8.6 x 4.3 x 4.9 cm with 3 intramural fibroids 1.7 to 1.8 cm large (2 located posteriorly, one located anteriorly).   Pertinent Gynecological History: Menses: flow is moderate Bleeding: Normal Contraception: tubal ligation DES exposure: unknown Blood transfusions: none Sexually transmitted diseases: no past history Previous GYN Procedures: None  Last mammogram: normal Date: 12/2015 Last pap: normal Date: 12/2015 OB History: G3, P1112   Menstrual History: Menarche age: 83  No LMP recorded.  LMP 04/25/2016.    Past Medical History  Diagnosis Date  . Bipolar 1 disorder, depressed, mild ([place])   . Migraines   . GERD (gastroesophageal reflux disease)   . Bilateral ovarian cysts   . Asthma   . Pregnancy induced hypertension   . HTN (hypertension)   . Anemia   . Seizures ([place])     during labor just that one time  . History of blood transfusion 1994    during delivery    Past Surgical History  Procedure Laterality Date  . Cesarean section    . Induced abortion    . Tubal ligation      Family History  Problem Relation Age of Onset  . Asthma Mother   . Asthma Brother     Social History:  reports that she quit smoking about 2 years ago. Her smoking use included Cigarettes. She has a 5 pack-year smoking history. She has never used smokeless tobacco. She reports that she drinks alcohol. She reports that she uses illicit drugs (Marijuana).  Allergies:  Allergies  Allergen Reactions  . Vicodin [Hydrocodone-Acetaminophen] Hives  . Prednisone Swelling, Rash and Other (See Comments)    Pt states that this medication causes her legs to swell.  Pt also states that she does not  have any problems with the IV form of this medication.      No prescriptions prior to admission  Medications:   HCTZ Percocet  Ventolin HFA inhaler   ROS   Constitutional: Denies fevers/chills Cardiovascular: Denies chest pain or palpitations Pulmonary: Denies coughing or wheezing Gastrointestinal: Denies nausea, vomiting or diarrhea Genitourinary: Denies pelvic pain, unusual vaginal bleeding, unusual vaginal discharge, dysuria, urgency or frequency.  Musculoskeletal: Denies muscle or joint aches and pain.  Neurology: Denies abnormal sensations such as tingling or numbness.    There were no vitals taken for this visit. Physical Exam  Physical Exam  Filed Vitals:   05/05/16 0639  BP: 139/93  Pulse: 89  Temp: 98.5 F (36.9 C)  Resp: 16   Constitutional: She is oriented to person, place, and time. She appears well-developed and well-nourished.  HENT:  Head: Normocephalic and atraumatic.  Neck: Normal range of motion.  Cardiovascular: Normal rate, regular rhythm and normal heart sounds.   Respiratory: Effort normal and breath sounds normal.  GI: Soft. Bowel sounds are normal.  Neurological: She is alert and oriented to person, place, and time.  Skin: Skin is warm and dry.  Psychiatric: She has a normal mood and affect. Her behavior is normal.  No results found for this or any previous visit (from the past 24 hour(s)).  No results found. CBC    Component Value Date/Time   WBC 7.5 04/25/2016 1030   RBC 4.47 04/25/2016 1030  HGB 13.9 04/25/2016 1030   HCT 41.4 04/25/2016 1030   PLT 389 04/25/2016 1030   MCV 92.6 04/25/2016 1030   MCH 31.1 04/25/2016 1030   MCHC 33.6 04/25/2016 1030   RDW 13.2 04/25/2016 1030   LYMPHSABS 3.4 06/30/2015 1022   MONOABS 0.5 06/30/2015 1022   EOSABS 0.6 06/30/2015 1022   BASOSABS 0.0 06/30/2015 1022   CMP     Component Value Date/Time   NA 139 04/25/2016 1030   K 4.0 04/25/2016 1030   CL 107 04/25/2016 1030   CO2 25  04/25/2016 1030   GLUCOSE 104* 04/25/2016 1030   BUN 6 04/25/2016 1030   CREATININE 0.82 04/25/2016 1030   CALCIUM 8.8* 04/25/2016 1030   PROT 7.2 08/25/2015 0112   ALBUMIN 4.2 08/25/2015 0112   AST 19 08/25/2015 0112   ALT 14 08/25/2015 0112   ALKPHOS 47 08/25/2015 0112   BILITOT 0.2* 08/25/2015 0112   GFRNONAA >60 04/25/2016 1030   GFRAA >60 04/25/2016 1030   05/05/16: Urine pregnancy test: Negative.   04/25/16:  Blood group: O positive.  Antibody screen negative.    Assessment/Plan: 40 y/o with fibroid uterus, chronic pelvic pain, dysmenorrhea, for exploratory laparotomy, abdominal myomectomy with possible ultrasound guidance, possible appendectomy (if abnormal), possible bilateral salpingectomy (if abnormal), possible endometriosis resection.   Admit for surgery. Sign Informed consent  NPO IV fluids. -Preop antibiotic prophylaxis.    [facility] 05/03/2016, 5:26 PM

## 2016-05-05 ENCOUNTER — Inpatient Hospital Stay (HOSPITAL_COMMUNITY): Payer: BLUE CROSS/BLUE SHIELD | Admitting: Anesthesiology

## 2016-05-05 ENCOUNTER — Inpatient Hospital Stay (HOSPITAL_COMMUNITY): Payer: BLUE CROSS/BLUE SHIELD

## 2016-05-05 ENCOUNTER — Inpatient Hospital Stay (HOSPITAL_COMMUNITY)
Admission: RE | Admit: 2016-05-05 | Discharge: 2016-05-07 | DRG: 742 | Disposition: A | Discharge status: Home / Self Care | Payer: BLUE CROSS/BLUE SHIELD | Source: Ambulatory Visit | Attending: Obstetrics & Gynecology | Admitting: Obstetrics & Gynecology

## 2016-05-05 ENCOUNTER — Encounter (HOSPITAL_COMMUNITY): Payer: Self-pay | Admitting: Emergency Medicine

## 2016-05-05 ENCOUNTER — Encounter (HOSPITAL_COMMUNITY)
Admission: RE | Disposition: A | Discharge status: Home / Self Care | Payer: Self-pay | Source: Ambulatory Visit | Attending: Obstetrics & Gynecology

## 2016-05-05 DIAGNOSIS — J45909 Unspecified asthma, uncomplicated: Secondary | ICD-10-CM | POA: Diagnosis present

## 2016-05-05 DIAGNOSIS — I10 Essential (primary) hypertension: Secondary | ICD-10-CM | POA: Diagnosis present

## 2016-05-05 DIAGNOSIS — K219 Gastro-esophageal reflux disease without esophagitis: Secondary | ICD-10-CM | POA: Diagnosis present

## 2016-05-05 DIAGNOSIS — D219 Benign neoplasm of connective and other soft tissue, unspecified: Secondary | ICD-10-CM

## 2016-05-05 DIAGNOSIS — D649 Anemia, unspecified: Secondary | ICD-10-CM | POA: Diagnosis present

## 2016-05-05 DIAGNOSIS — Z6834 Body mass index (BMI) 34.0-34.9, adult: Secondary | ICD-10-CM | POA: Diagnosis not present

## 2016-05-05 DIAGNOSIS — D259 Leiomyoma of uterus, unspecified: Secondary | ICD-10-CM | POA: Diagnosis present

## 2016-05-05 DIAGNOSIS — D251 Intramural leiomyoma of uterus: Secondary | ICD-10-CM | POA: Diagnosis present

## 2016-05-05 DIAGNOSIS — G8929 Other chronic pain: Secondary | ICD-10-CM

## 2016-05-05 DIAGNOSIS — N801 Endometriosis of ovary: Secondary | ICD-10-CM | POA: Diagnosis present

## 2016-05-05 DIAGNOSIS — F3131 Bipolar disorder, current episode depressed, mild: Secondary | ICD-10-CM | POA: Diagnosis present

## 2016-05-05 DIAGNOSIS — Z87891 Personal history of nicotine dependence: Secondary | ICD-10-CM

## 2016-05-05 DIAGNOSIS — N803 Endometriosis of pelvic peritoneum: Secondary | ICD-10-CM | POA: Diagnosis present

## 2016-05-05 DIAGNOSIS — N946 Dysmenorrhea, unspecified: Secondary | ICD-10-CM | POA: Diagnosis present

## 2016-05-05 DIAGNOSIS — N809 Endometriosis, unspecified: Secondary | ICD-10-CM

## 2016-05-05 DIAGNOSIS — E669 Obesity, unspecified: Secondary | ICD-10-CM | POA: Diagnosis present

## 2016-05-05 DIAGNOSIS — R102 Pelvic and perineal pain: Secondary | ICD-10-CM

## 2016-05-05 HISTORY — PX: LAPAROTOMY: SHX154

## 2016-05-05 HISTORY — PX: MYOMECTOMY: SHX85

## 2016-05-05 LAB — PREGNANCY, URINE: PREG TEST UR: NEGATIVE

## 2016-05-05 SURGERY — LAPAROTOMY, EXPLORATORY
Anesthesia: General | Site: Abdomen

## 2016-05-05 MED ORDER — HYDROMORPHONE HCL 1 MG/ML IJ SOLN
INTRAMUSCULAR | Status: AC
  Filled 2016-05-05: qty 1

## 2016-05-05 MED ORDER — ONDANSETRON HCL 4 MG/2ML IJ SOLN
INTRAMUSCULAR | Status: AC
  Filled 2016-05-05: qty 2

## 2016-05-05 MED ORDER — DOCUSATE SODIUM 100 MG PO CAPS
100.0000 mg | ORAL_CAPSULE | Freq: Two times a day (BID) | ORAL | Status: DC
  Administered 2016-05-06 – 2016-05-07 (×3): 100 mg via ORAL
  Filled 2016-05-05 (×3): qty 1

## 2016-05-05 MED ORDER — DIPHENHYDRAMINE HCL 12.5 MG/5ML PO ELIX: 12.5000 mg | ORAL_SOLUTION | Freq: Four times a day (QID) | ORAL | Status: DC | PRN

## 2016-05-05 MED ORDER — ONDANSETRON HCL 4 MG/2ML IJ SOLN
INTRAMUSCULAR | Status: DC | PRN
  Administered 2016-05-05: 4 mg via INTRAVENOUS

## 2016-05-05 MED ORDER — TRAMADOL HCL 50 MG PO TABS: 50.0000 mg | ORAL_TABLET | Freq: Four times a day (QID) | ORAL | Status: DC | PRN

## 2016-05-05 MED ORDER — ROCURONIUM BROMIDE 100 MG/10ML IV SOLN
INTRAVENOUS | Status: DC | PRN
  Administered 2016-05-05: 50 mg via INTRAVENOUS
  Administered 2016-05-05: 20 mg via INTRAVENOUS

## 2016-05-05 MED ORDER — FENTANYL CITRATE (PF) 100 MCG/2ML IJ SOLN
25.0000 ug | INTRAMUSCULAR | Status: DC | PRN
  Administered 2016-05-05: 50 ug via INTRAVENOUS

## 2016-05-05 MED ORDER — MENTHOL 3 MG MT LOZG: 1.0000 | LOZENGE | OROMUCOSAL | Status: DC | PRN

## 2016-05-05 MED ORDER — GLYCOPYRROLATE 0.2 MG/ML IJ SOLN
INTRAMUSCULAR | Status: AC
  Filled 2016-05-05: qty 4

## 2016-05-05 MED ORDER — HYDROCHLOROTHIAZIDE 25 MG PO TABS
25.0000 mg | ORAL_TABLET | Freq: Every day | ORAL | Status: DC
  Administered 2016-05-06 – 2016-05-07 (×2): 25 mg via ORAL
  Filled 2016-05-05 (×2): qty 1

## 2016-05-05 MED ORDER — FENTANYL CITRATE (PF) 100 MCG/2ML IJ SOLN
INTRAMUSCULAR | Status: AC
  Filled 2016-05-05: qty 2

## 2016-05-05 MED ORDER — DEXAMETHASONE SODIUM PHOSPHATE 10 MG/ML IJ SOLN
INTRAMUSCULAR | Status: AC
  Filled 2016-05-05: qty 1

## 2016-05-05 MED ORDER — SCOPOLAMINE 1 MG/3DAYS TD PT72
1.0000 | MEDICATED_PATCH | Freq: Once | TRANSDERMAL | Status: DC
  Administered 2016-05-05: 1.5 mg via TRANSDERMAL

## 2016-05-05 MED ORDER — FENTANYL CITRATE (PF) 250 MCG/5ML IJ SOLN
INTRAMUSCULAR | Status: AC
  Filled 2016-05-05: qty 5

## 2016-05-05 MED ORDER — PROPOFOL 10 MG/ML IV BOLUS
INTRAVENOUS | Status: AC
  Filled 2016-05-05: qty 20

## 2016-05-05 MED ORDER — METOCLOPRAMIDE HCL 5 MG/ML IJ SOLN
INTRAMUSCULAR | Status: DC | PRN
  Administered 2016-05-05: 10 mg via INTRAVENOUS

## 2016-05-05 MED ORDER — ROCURONIUM BROMIDE 100 MG/10ML IV SOLN
INTRAVENOUS | Status: AC
  Filled 2016-05-05: qty 1

## 2016-05-05 MED ORDER — ONDANSETRON HCL 4 MG/2ML IJ SOLN: 4.0000 mg | Freq: Four times a day (QID) | INTRAMUSCULAR | Status: DC | PRN

## 2016-05-05 MED ORDER — FENTANYL CITRATE (PF) 100 MCG/2ML IJ SOLN
25.0000 ug | INTRAMUSCULAR | Status: DC | PRN
  Administered 2016-05-05: 25 ug via INTRAVENOUS
  Administered 2016-05-05: 50 ug via INTRAVENOUS
  Administered 2016-05-05: 25 ug via INTRAVENOUS

## 2016-05-05 MED ORDER — LIDOCAINE HCL (CARDIAC) 20 MG/ML IV SOLN
INTRAVENOUS | Status: DC | PRN
  Administered 2016-05-05: 100 mg via INTRAVENOUS

## 2016-05-05 MED ORDER — KETOROLAC TROMETHAMINE 30 MG/ML IJ SOLN
INTRAMUSCULAR | Status: DC | PRN
  Administered 2016-05-05: 30 mg via INTRAVENOUS

## 2016-05-05 MED ORDER — FENTANYL CITRATE (PF) 250 MCG/5ML IJ SOLN
INTRAMUSCULAR | Status: DC | PRN
  Administered 2016-05-05 (×2): 50 ug via INTRAVENOUS
  Administered 2016-05-05: 100 ug via INTRAVENOUS
  Administered 2016-05-05 (×2): 50 ug via INTRAVENOUS
  Administered 2016-05-05: 100 ug via INTRAVENOUS
  Administered 2016-05-05: 50 ug via INTRAVENOUS

## 2016-05-05 MED ORDER — HYDROMORPHONE HCL 1 MG/ML IJ SOLN
INTRAMUSCULAR | Status: DC | PRN
  Administered 2016-05-05 (×2): 1 mg via INTRAVENOUS

## 2016-05-05 MED ORDER — LIDOCAINE HCL (CARDIAC) 20 MG/ML IV SOLN
INTRAVENOUS | Status: AC
  Filled 2016-05-05: qty 5

## 2016-05-05 MED ORDER — SODIUM CHLORIDE 0.9 % IJ SOLN
INTRAMUSCULAR | Status: AC
  Filled 2016-05-05: qty 50

## 2016-05-05 MED ORDER — KETOROLAC TROMETHAMINE 30 MG/ML IJ SOLN
INTRAMUSCULAR | Status: AC
  Filled 2016-05-05: qty 1

## 2016-05-05 MED ORDER — ALBUTEROL SULFATE (2.5 MG/3ML) 0.083% IN NEBU: 3.0000 mL | INHALATION_SOLUTION | Freq: Four times a day (QID) | RESPIRATORY_TRACT | Status: DC | PRN

## 2016-05-05 MED ORDER — PNEUMOCOCCAL VAC POLYVALENT 25 MCG/0.5ML IJ INJ
0.5000 mL | INJECTION | INTRAMUSCULAR | Status: DC
  Filled 2016-05-05: qty 0.5

## 2016-05-05 MED ORDER — MIDAZOLAM HCL 2 MG/2ML IJ SOLN
INTRAMUSCULAR | Status: DC | PRN
  Administered 2016-05-05: 2 mg via INTRAVENOUS

## 2016-05-05 MED ORDER — KETOROLAC TROMETHAMINE 30 MG/ML IJ SOLN
30.0000 mg | Freq: Four times a day (QID) | INTRAMUSCULAR | Status: AC
  Administered 2016-05-05 – 2016-05-06 (×4): 30 mg via INTRAVENOUS
  Filled 2016-05-05 (×4): qty 1

## 2016-05-05 MED ORDER — NALOXONE HCL 0.4 MG/ML IJ SOLN: 0.4000 mg | INTRAMUSCULAR | Status: DC | PRN

## 2016-05-05 MED ORDER — NEOSTIGMINE METHYLSULFATE 10 MG/10ML IV SOLN
INTRAVENOUS | Status: AC
  Filled 2016-05-05: qty 1

## 2016-05-05 MED ORDER — VASOPRESSIN 20 UNIT/ML IV SOLN
INTRAVENOUS | Status: DC | PRN
  Administered 2016-05-05: 20 [IU]

## 2016-05-05 MED ORDER — DIPHENHYDRAMINE HCL 50 MG/ML IJ SOLN: 12.5000 mg | Freq: Four times a day (QID) | INTRAMUSCULAR | Status: DC | PRN

## 2016-05-05 MED ORDER — LACTATED RINGERS IV SOLN
INTRAVENOUS | Status: DC
  Administered 2016-05-05 (×2): via INTRAVENOUS
  Administered 2016-05-05: 125 mL/h via INTRAVENOUS
  Administered 2016-05-06: 05:00:00 via INTRAVENOUS

## 2016-05-05 MED ORDER — CEFAZOLIN SODIUM-DEXTROSE 2-4 GM/100ML-% IV SOLN
2.0000 g | INTRAVENOUS | Status: AC
  Administered 2016-05-05: 2 g via INTRAVENOUS

## 2016-05-05 MED ORDER — PROPOFOL 10 MG/ML IV BOLUS
INTRAVENOUS | Status: DC | PRN
  Administered 2016-05-05: 200 mg via INTRAVENOUS

## 2016-05-05 MED ORDER — DEXAMETHASONE SODIUM PHOSPHATE 4 MG/ML IJ SOLN
INTRAMUSCULAR | Status: DC | PRN
  Administered 2016-05-05: 10 mg via INTRAVENOUS

## 2016-05-05 MED ORDER — IBUPROFEN 600 MG PO TABS
600.0000 mg | ORAL_TABLET | Freq: Four times a day (QID) | ORAL | Status: DC | PRN
Start: 2016-05-05 — End: 2016-05-07

## 2016-05-05 MED ORDER — SODIUM CHLORIDE 0.9% FLUSH: 9.0000 mL | INTRAVENOUS | Status: DC | PRN

## 2016-05-05 MED ORDER — METOCLOPRAMIDE HCL 5 MG/ML IJ SOLN
INTRAMUSCULAR | Status: AC
  Filled 2016-05-05: qty 2

## 2016-05-05 MED ORDER — HYDROMORPHONE 1 MG/ML IV SOLN
INTRAVENOUS | Status: DC
  Administered 2016-05-05: 13:00:00 via INTRAVENOUS
  Administered 2016-05-05: 0.9 mg via INTRAVENOUS
  Administered 2016-05-05: 0.6 mg via INTRAVENOUS
  Administered 2016-05-06: 0.3 mg via INTRAVENOUS
  Administered 2016-05-06: 0.9 mg via INTRAVENOUS
  Filled 2016-05-05: qty 25

## 2016-05-05 MED ORDER — VASOPRESSIN 20 UNIT/ML IV SOLN
INTRAVENOUS | Status: AC
  Filled 2016-05-05: qty 1

## 2016-05-05 MED ORDER — LACTATED RINGERS IV SOLN
INTRAVENOUS | Status: DC
  Administered 2016-05-05 (×3): via INTRAVENOUS

## 2016-05-05 MED ORDER — NEOSTIGMINE METHYLSULFATE 10 MG/10ML IV SOLN
INTRAVENOUS | Status: DC | PRN
  Administered 2016-05-05: 3 mg via INTRAVENOUS

## 2016-05-05 MED ORDER — SODIUM CHLORIDE 0.9 % IJ SOLN
INTRAMUSCULAR | Status: DC | PRN
  Administered 2016-05-05: 5 mL via INTRAVENOUS

## 2016-05-05 MED ORDER — SCOPOLAMINE 1 MG/3DAYS TD PT72
MEDICATED_PATCH | TRANSDERMAL | Status: AC
  Administered 2016-05-05: 1.5 mg via TRANSDERMAL
  Filled 2016-05-05: qty 1

## 2016-05-05 MED ORDER — ONDANSETRON HCL 4 MG PO TABS: 4.0000 mg | ORAL_TABLET | Freq: Three times a day (TID) | ORAL | Status: DC | PRN

## 2016-05-05 MED ORDER — MIDAZOLAM HCL 2 MG/2ML IJ SOLN
INTRAMUSCULAR | Status: AC
  Filled 2016-05-05: qty 2

## 2016-05-05 MED ORDER — GLYCOPYRROLATE 0.2 MG/ML IJ SOLN
INTRAMUSCULAR | Status: DC | PRN
  Administered 2016-05-05: 0.1 mg via INTRAVENOUS
  Administered 2016-05-05: 0.6 mg via INTRAVENOUS

## 2016-05-05 SURGICAL SUPPLY — 39 items
BARRIER ADHS 3X4 INTERCEED (GAUZE/BANDAGES/DRESSINGS) ×2 IMPLANT
BLADE SURG 10 STRL SS (BLADE) ×6 IMPLANT
BRR ADH 4X3 ABS CNTRL BYND (GAUZE/BANDAGES/DRESSINGS) ×1
CANISTER SUCT 3000ML (MISCELLANEOUS) ×3 IMPLANT
CLOTH BEACON ORANGE TIMEOUT ST (SAFETY) ×3 IMPLANT
DRAPE WARM FLUID 44X44 (DRAPE) ×2 IMPLANT
DRSG OPSITE POSTOP 4X10 (GAUZE/BANDAGES/DRESSINGS) ×3 IMPLANT
DRSG TELFA 3X8 NADH (GAUZE/BANDAGES/DRESSINGS) ×3 IMPLANT
DURAPREP 26ML APPLICATOR (WOUND CARE) ×3 IMPLANT
GAUZE SPONGE 4X4 16PLY XRAY LF (GAUZE/BANDAGES/DRESSINGS) ×2 IMPLANT
GLOVE BIOGEL PI IND STRL 7.0 (GLOVE) ×4 IMPLANT
GLOVE BIOGEL PI INDICATOR 7.0 (GLOVE) ×8
GLOVE SURG SS PI 6.5 STRL IVOR (GLOVE) ×3 IMPLANT
GOWN STRL REUS W/TWL LRG LVL3 (GOWN DISPOSABLE) ×9 IMPLANT
LIQUID BAND (GAUZE/BANDAGES/DRESSINGS) ×2 IMPLANT
NEEDLE HYPO 22GX1.5 SAFETY (NEEDLE) ×3 IMPLANT
NS IRRIG 1000ML POUR BTL (IV SOLUTION) ×5 IMPLANT
PACK ABDOMINAL GYN (CUSTOM PROCEDURE TRAY) ×3 IMPLANT
PAD DRESSING TELFA 3X8 NADH (GAUZE/BANDAGES/DRESSINGS) IMPLANT
PAD OB MATERNITY 4.3X12.25 (PERSONAL CARE ITEMS) ×3 IMPLANT
PENCIL SMOKE EVAC W/HOLSTER (ELECTROSURGICAL) ×3 IMPLANT
PROTECTOR NERVE ULNAR (MISCELLANEOUS) ×1 IMPLANT
SPONGE LAP 18X18 X RAY DECT (DISPOSABLE) ×3 IMPLANT
SUT MON AB 4-0 PS1 27 (SUTURE) ×3 IMPLANT
SUT PLAIN 2 0 XLH (SUTURE) ×2 IMPLANT
SUT VIC AB 0 CT1 18XCR BRD8 (SUTURE) IMPLANT
SUT VIC AB 0 CT1 27 (SUTURE) ×12
SUT VIC AB 0 CT1 27XBRD ANBCTR (SUTURE) ×4 IMPLANT
SUT VIC AB 0 CT1 8-18 (SUTURE)
SUT VIC AB 2-0 CT2 27 (SUTURE) ×2 IMPLANT
SUT VIC AB 3-0 CT1 27 (SUTURE)
SUT VIC AB 3-0 CT1 TAPERPNT 27 (SUTURE) IMPLANT
SUT VIC AB 4-0 SH 27 (SUTURE) ×12
SUT VIC AB 4-0 SH 27XANBCTRL (SUTURE) ×4 IMPLANT
SUT VICRYL 0 27 CT2 27 ABS (SUTURE) ×8 IMPLANT
SUT VICRYL 0 TIES 12 18 (SUTURE) IMPLANT
SYR CONTROL 10ML LL (SYRINGE) ×3 IMPLANT
TOWEL OR 17X24 6PK STRL BLUE (TOWEL DISPOSABLE) ×6 IMPLANT
TRAY FOLEY CATH SILVER 14FR (SET/KITS/TRAYS/PACK) ×3 IMPLANT

## 2016-05-05 NOTE — Anesthesia Procedure Notes (Signed)
Procedure Name: Intubation Date/Time: 05/05/2016 8:32 AM Performed by: Flossie Dibble Pre-anesthesia Checklist: Suction available, Emergency Drugs available, Timeout performed, Patient being monitored and Patient identified Patient Re-evaluated:Patient Re-evaluated prior to inductionOxygen Delivery Method: Circle system utilized Preoxygenation: Pre-oxygenation with 100% oxygen Intubation Type: IV induction Ventilation: Mask ventilation without difficulty Laryngoscope Size: Mac and 3 Grade View: Grade I Tube type: Oral Laser Tube: Cuffed inflated with minimal occlusive pressure - saline Tube size: 7.0 mm Number of attempts: 1 Airway Equipment and Method: Patient positioned with wedge pillow and Stylet Placement Confirmation: ETT inserted through vocal cords under direct vision,  positive ETCO2 and breath sounds checked- equal and bilateral Secured at: 21 cm Dental Injury: Teeth and Oropharynx as per pre-operative assessment

## 2016-05-05 NOTE — Op Note (Addendum)
Floda, Gonalez D.  DOB May 28, 1976  M00  Pre-op diagnosis:    1. Chronic pelvic pain. 2.  Dysmenorrhea.  3.  Fibroid uterus.     Post-op diagnosis: Same as above plus 4.  Pelvic adhesions. 5.  Pelvic lesions, likely endometriosis.    Procedures:  1. Exploratory laparotomy. 2. Abdominal myomectomy. 3. Resection of pelvic lesions, likely endometriosis. 4. Lysis of adhesions.   5.  Intra operative Ultrasound.    Anesthesia:  General   Complications: None  Findings: Uterus with 4 intramural uterine fibroids.  Normal right ovary.  Left ovary with small cyst with clear fluid that ruptured.  Both fallopian tubes with space between isthmus and ampulla portions, consistent with bilateral tubal ligation history.  Adhesions between left ovary and tube and omentum.  Adhesions between right fallopian tube and omentum.     EBL: 150 cc  IVF:  2600 LR  Urine output: 400 cc  Indications: 40 y/o P2 with hx of chronic pelvic pain, dysmenorrhea and fibroid uterus who desired exploratory laparotomy and abdominal myomectomy as medical management of her symptoms had been unsuccessful.     PROCEDURE:  Informed consent was obtained from the patient to undergo the procedure after explaining the risks, benefits and alternatives of the procedure.   A foley catheter was placed in and the patient was prepped vaginally and abdominally and draped in the usual sterile fashion.  A ring forcep with gauze pad was placed in the vagina.      Ancef was given pre-operatively.  A pfannenstiel incision was made with the scalpel over the old incision and carried through the underlying subcutaneous layer and fascia with the bovie.  Small perforators were contained with the bovie.  The fascia was nicked in the midline and the fascia separated from the rectus muscles superiorly, inferiorly and bilaterally.  The rectus muscle was separated in the midline and the uterus was inspected and the fibroids noted.  The O'connor O  sullivan retractor was placed in and the uterus exteriorized.  Vasopressin was injected over the fibroids, incised with scalpel and the fibroid was shelled from the sorrounding myometrium.  The uterine defect were closed with 0-vicryl in a running stitch in 3 layers. The subserosal layer was closed in a baseball stitch using 4-0 vicryl.  A similar procedure was perfomed to remove the remaining fibroids.  Three main incisions were made through the uterus, one posterior and to the right to remove two fibroids, another one posterior, to the right and inferior to first one to remove one fibrouid and one posterior and to left to remove one fibroid.  There was no entry into the endometrium as the fibroids were located intramurally.  At the end of the procedure good  hemostasis was noted.  Intra-op ultrasound showed no remaining fibroids.  The pelvis was irrigated and suctioned out.  Interceed was placed over the incisions on the uterus.    Lysis of adhesions was perfomed between adnexa and omentum.  A small ovarian cyst with clear fluid within in the left ovary was inadvertently ruptured with clear fluid released. Rupture point on left ovary was contained with 4-0 vicryl suture.  Interceed was placed over left ovary.      The pelvis was inspected and one area on right fallopian tube with hyperpigmented lesion was biopsied using metzenbaum scissors.  Another similarly hyperpigmented lesion was resected from right peritoneum. No other pelvic lesions were identified.  The appendix was noted to be normal.   Irrigation was  applied and suctioned out.         The fascia was closed off using 0-vicryl in a running stitch.  The muscle was reapproximated with 0-vicryl in a running stitch.  The subcutaneous layer was closed off with plain suture.  The skin was closed off with 4-0 monocryl in a subcuticular stitch.    Dermabond then dressing was placed over the incision.  The ring forcep with gauze was removed from the  vagina.    The patient was awoken from anesthesia and taken to the recovery room in stable condition.    Dr. Alesia Richards.

## 2016-05-05 NOTE — Brief Op Note (Signed)
05/05/2016  10:45 AM  PATIENT:  [name]  40 y.o. female  PRE-OPERATIVE DIAGNOSIS:  Intramural Fibroids, Chronic Pelvic Pain   POST-OPERATIVE DIAGNOSIS:  Intramural Fibroids, Chronic Pelvic Pain, Pelvic adhesions, possible endometriosis.     PROCEDURE:  Procedure(s) with comments: EXPLORATORY LAPAROTOMY WITH ULTRASOUND (N/A) - needs ultrasound ...Marland KitchenMarland KitchenMarland Kitchenlysis of adhesions  MYOMECTOMY, Biopsy Right Ovary & Peritoneum (N/A)  SURGEON:  Surgeon(s) and Role:    * Waymon Amato, MD - Primary  PHYSICIAN ASSISTANT: Earnstine Regal, PA.    ANESTHESIA:   general  EBL:  Total I/O In: 64 [I.V.:2600] Out: 400 [Urine:250; Blood:150]  BLOOD ADMINISTERED:none  DRAINS: none   LOCAL MEDICATIONS USED:  Vasopressin  SPECIMEN:  Source of Specimen:  FIbroids, pelvic lesions- suspect endometriosis.    DISPOSITION OF SPECIMEN:  PATHOLOGY  COUNTS:  YES  TOURNIQUET:  * No tourniquets in log *  DICTATION: .Dragon Dictation  PLAN OF CARE: Admit to inpatient   PATIENT DISPOSITION:  PACU - hemodynamically stable.   Delay start of Pharmacological VTE agent (>24hrs) due to surgical blood loss or risk of bleeding: not applicable

## 2016-05-05 NOTE — Interval H&P Note (Signed)
History and Physical Interval Note:  05/05/2016 7:56 AM  [name]  has presented today for surgery, with the diagnosis of Intramural Fibroids, Chronic Pelvic Pain   The various methods of treatment have been discussed with the patient and family. After consideration of risks, benefits and other options for treatment, the patient has consented to  Procedure(s) with comments: EXPLORATORY LAPAROTOMY WITH ULTRASOUND (N/A) - needs ultrasound ...Marland KitchenMarland KitchenMarland Kitchenlysis of adhesions  MYOMECTOMY (N/A), possible removal of tubes if found to be abnormal, possible removal of appendix if found to be abnormal as a surgical intervention .  The patient's history has been reviewed, patient examined, no change in status, stable for surgery.  I have reviewed the patient's chart and labs.  Questions were answered to the patient's satisfaction.     [facility]

## 2016-05-05 NOTE — Anesthesia Postprocedure Evaluation (Signed)
Anesthesia Post Note  Patient: [name]  Procedure(s) Performed: Procedure(s) (LRB): EXPLORATORY LAPAROTOMY WITH ULTRASOUND (N/A) MYOMECTOMY, Biopsy Right Ovary & Peritoneum (N/A)  Patient location during evaluation: [facility] Anesthesia Type: General Level of consciousness: awake and alert and oriented Pain management: pain level controlled Vital Signs Assessment: post-procedure vital signs reviewed and stable Respiratory status: spontaneous breathing, nonlabored ventilation and respiratory function stable Cardiovascular status: stable Postop Assessment: no signs of nausea or vomiting and adequate PO intake Anesthetic complications: no     Last Vitals:  Filed Vitals:   05/05/16 1240 05/05/16 1330  BP:  112/70  Pulse:  79  Temp:  36.8 C  Resp: 14 14    Last Pain:  Filed Vitals:   05/05/16 1334  PainSc: 4    Pain Goal: Patients Stated Pain Goal: 4 (05/05/16 1330)               Willa Rough

## 2016-05-05 NOTE — Anesthesia Preprocedure Evaluation (Addendum)
Anesthesia Evaluation  Patient identified by MRN, date of birth, ID band Patient awake    Reviewed: Allergy & Precautions, NPO status , Patient's Chart, lab work & pertinent test results  Airway Mallampati: II  TM Distance: >3 FB Neck ROM: Full    Dental  (+) Teeth Intact, Dental Advisory Given,    Pulmonary asthma , Current Smoker,    Pulmonary exam normal breath sounds clear to auscultation       Cardiovascular hypertension, Pt. on medications Normal cardiovascular exam Rhythm:Regular Rate:Normal     Neuro/Psych  Headaches, Seizures - (one episode with pregnancy),  PSYCHIATRIC DISORDERS Depression Bipolar Disorder    GI/Hepatic Neg liver ROS, GERD  ,  Endo/Other  Obesity   Renal/GU negative Renal ROS     Musculoskeletal negative musculoskeletal ROS (+)   Abdominal   Peds  Hematology negative hematology ROS (+)   Anesthesia Other Findings Day of surgery medications reviewed with the patient.  Reproductive/Obstetrics negative OB ROS                            Anesthesia Physical Anesthesia Plan  ASA: II  Anesthesia Plan: General   Post-op Pain Management:    Induction: Intravenous  Airway Management Planned: Oral ETT  Additional Equipment:   Intra-op Plan:   Post-operative Plan: Extubation in OR  Informed Consent: I have reviewed the patients History and Physical, chart, labs and discussed the procedure including the risks, benefits and alternatives for the proposed anesthesia with the patient or authorized representative who has indicated his/her understanding and acceptance.   Dental advisory given  Plan Discussed with: CRNA  Anesthesia Plan Comments: (Risks/benefits of general anesthesia discussed with patient including risk of damage to teeth, lips, gum, and tongue, nausea/vomiting, allergic reactions to medications, and the possibility of heart attack, stroke and death.   All patient questions answered.  Patient wishes to proceed.)        Anesthesia Quick Evaluation

## 2016-05-05 NOTE — Transfer of Care (Signed)
Immediate Anesthesia Transfer of Care Note  Patient: [name]  Procedure(s) Performed: Procedure(s) with comments: EXPLORATORY LAPAROTOMY WITH ULTRASOUND (N/A) - needs ultrasound ...Marland KitchenMarland KitchenMarland Kitchenlysis of adhesions  MYOMECTOMY, Biopsy Right Ovary & Peritoneum (N/A)  Patient Location: PACU  Anesthesia Type:General  Level of Consciousness: awake, alert  and oriented  Airway & Oxygen Therapy: Patient Spontanous Breathing and Patient connected to nasal cannula oxygen  Post-op Assessment: Report given to RN and Post -op Vital signs reviewed and stable  Post vital signs: Reviewed and stable  Last Vitals:  Filed Vitals:   05/05/16 0639  BP: 139/93  Pulse: 89  Temp: 36.9 C  Resp: 16    Last Pain: There were no vitals filed for this visit.    Patients Stated Pain Goal: 4 ([id number])  Complications: No apparent anesthesia complications

## 2016-05-06 ENCOUNTER — Encounter (HOSPITAL_COMMUNITY): Payer: Self-pay | Admitting: Obstetrics & Gynecology

## 2016-05-06 LAB — CBC
HEMATOCRIT: 31.9 % — AB (ref 36.0–46.0)
HEMOGLOBIN: 10.8 g/dL — AB (ref 12.0–15.0)
MCH: 30.8 pg (ref 26.0–34.0)
MCHC: 33.9 g/dL (ref 30.0–36.0)
MCV: 90.9 fL (ref 78.0–100.0)
Platelets: 367 10*3/uL (ref 150–400)
RBC: 3.51 MIL/uL — AB (ref 3.87–5.11)
RDW: 12.7 % (ref 11.5–15.5)
WBC: 14.9 10*3/uL — ABNORMAL HIGH (ref 4.0–10.5)

## 2016-05-06 MED ORDER — OXYCODONE-ACETAMINOPHEN 5-325 MG PO TABS
1.0000 | ORAL_TABLET | ORAL | Status: DC | PRN
  Administered 2016-05-06: 1 via ORAL
  Administered 2016-05-06 – 2016-05-07 (×3): 2 via ORAL
  Filled 2016-05-06 (×3): qty 2
  Filled 2016-05-06: qty 1

## 2016-05-06 NOTE — Progress Notes (Signed)
[name] is a17 y.o.  [id number]  Post Op Date # 1:  Abdominal Myomectomy  Subjective: Patient is Doing well postoperatively. Patient has Pain is controlled with current analgesics. Medications being used: prescription NSAID's including Ketorolac 30 mg and narcotic analgesics including PCA Dilaudid. Ambulated in the halls twice yesterday with no dizziness.  Tolerating ice chips with no nausea and requesting something to eat (even if it is just soup).  Hasn't passed flatus nor voided since her Foley was removed.   Objective: Vital signs in last 24 hours: Temp:  [97.8 F (36.6 C)-98.5 F (36.9 C)] 97.9 F (36.6 C) (05/19 0528) Pulse Rate:  [54-101] 64 (05/19 0528) Resp:  [10-31] 23 (05/19 0528) BP: (94-136)/(59-88) 107/64 mmHg (05/19 0528) SpO2:  [90 %-100 %] 99 % (05/19 0528) Weight:  [175 lb (79.379 kg)] 175 lb (79.379 kg) (05/18 1240)  Intake/Output from previous day: 05/18 0701 - 05/19 0700 In: 5345.7 [P.O.:550; I.V.:4795.7] Out: 1625 [Urine:1475] Intake/Output this shift:    Recent Labs Lab 05/06/16 0512  WBC 14.9*  HGB 10.8*  HCT 31.9*  PLT 367    No results for input(s): NA, K, CL, CO2, BUN, CREATININE, CALCIUM, PROT, BILITOT, ALKPHOS, ALT, AST, GLUCOSE in the last 168 hours.  Invalid input(s): LABALBU  EXAM: General: alert, cooperative and no distress Resp: clear to auscultation bilaterally Cardio: regular rate and rhythm, S1, S2 normal, no murmur, click, rub or gallop GI: Decreased bowel sounds;  dressing is clean/dry/intact Extremities: Homans sign is negative, no sign of DVT and SCD hose in place and functioning; no calf tenderness.   Assessment: s/p Procedure(s): EXPLORATORY LAPAROTOMY WITH ULTRASOUND MYOMECTOMY, Biopsy Right Ovary & Peritoneum: stable, progressing well and anemia  Plan: Routine care   Will consider advancing diet and oral medications. Per Dr. Alesia Richards  LOS: 1 day    Malene Blaydes, PA-C 05/06/2016 7:43 AM

## 2016-05-07 DIAGNOSIS — N809 Endometriosis, unspecified: Secondary | ICD-10-CM

## 2016-05-07 DIAGNOSIS — G8929 Other chronic pain: Secondary | ICD-10-CM

## 2016-05-07 DIAGNOSIS — D219 Benign neoplasm of connective and other soft tissue, unspecified: Secondary | ICD-10-CM

## 2016-05-07 DIAGNOSIS — R102 Pelvic and perineal pain: Secondary | ICD-10-CM

## 2016-05-07 MED ORDER — OXYCODONE-ACETAMINOPHEN 5-325 MG PO TABS: 1.0000 | ORAL_TABLET | ORAL | Status: DC | PRN

## 2016-05-07 MED ORDER — DOCUSATE SODIUM 100 MG PO CAPS: 100.0000 mg | ORAL_CAPSULE | Freq: Two times a day (BID) | ORAL | Status: DC

## 2016-05-07 MED ORDER — IBUPROFEN 600 MG PO TABS: 600.0000 mg | ORAL_TABLET | Freq: Four times a day (QID) | ORAL | Status: DC | PRN

## 2016-05-07 NOTE — Progress Notes (Signed)
Discharge instructions reviewed with patient.  Patient states understanding of home care, medications, activity, signs/symptoms to report to MD and return MD office visit.  Patients significant other and family will assist with her care @ home.  No home  equipment needed, patient has prescriptions and all personal belongings.  Patient discharged via wheelchair in stable condition with staff without incident.  

## 2016-05-07 NOTE — Plan of Care (Signed)
Problem: Activity: Goal: Risk for activity intolerance will decrease Outcome: Adequate for Discharge Patient walks in halls frequently and tolerates well.  Problem: Fluid Volume: Goal: Ability to maintain a balanced intake and output will improve Outcome: Completed/Met Date Met:  05/07/16 Tolerates a Regular diet and fluids.Voids large amounts of amber urine.  Problem: Self-Concept: Goal: Communication of feelings regarding changes in body function or appearance will improve Outcome: Completed/Met Date Met:  05/07/16 Patient expressed how happy she is for having had this procedure because she had been in pain for years.

## 2016-05-07 NOTE — Discharge Instructions (Signed)
Call [facility] @ [phone] if:  You have a temperature greater than or equal to 100.4 degrees Farenheit orally You have pain that is not made better by the pain medication given and taken as directed You have excessive bleeding or problems urinating  Take Colace (Docusate Sodium/Stool Softener) 100 mg 2-3 times daily while taking narcotic pain medicine to avoid constipation or until bowel movements are regular. Take Ibuprofen 600 mg with food every 6 hours for 5 days then as needed for pain  You may drive after 2  weeks You may walk up steps  You may shower  You may resume a regular diet  Keep incisions clean and dry and remove honeycomb dressing on May 12, 2016 Do not lift over 15 pounds for 6 weeks Avoid anything in vagina for 6 weeks (or until after your post-operative visit) Maintain regular walking at home and outside to help with quick healing.  Call if with any questions problems.

## 2016-05-07 NOTE — Discharge Summary (Signed)
Physician Discharge Summary  Patient ID: [name] MRN: [id number] DOB/AGE: [birth year] 40 y.o.  Admit date: 05/05/2016 Discharge date: 05/07/2016  Admission Diagnoses:  1. Chronic pelvic pain. 2.  Dysmenorrhea. 3.  Fibroid uterus. 4.  Hypertension history  Discharge Diagnoses:  Active Problems:   Fibroid, uterine   Fibroids   Chronic pelvic pain in female  Possible endometriosis (pathology pending) Pelvic adhesions  Procedures: Exploratory laparatomy, abdominal myomectomy, lysis of adhesions, resection of pelvic lesions suspect endometriosis, intra-op ultrasound.     Discharged Condition: stable  Hospital Course: Patient was admitted for exploratory laparotomy, abdominal myomectomy and lysis of adhesions.  Two pelvic lesions suspicious for endometriosis were resected (pathology pending).   She did well postoperatively and by post op day 2 she was tolerating a regular diet, pain was well controlled, she was voiding without difficulty.  She was deemed stable for discharge.   We discussed post-op follow up and precautions.   Consults: None  Significant Diagnostic Studies: labs:  CBC    Component Value Date/Time   WBC 14.9* 05/06/2016 0512   RBC 3.51* 05/06/2016 0512   HGB 10.8* 05/06/2016 0512   HCT 31.9* 05/06/2016 0512   PLT 367 05/06/2016 0512   MCV 90.9 05/06/2016 0512   MCH 30.8 05/06/2016 0512   MCHC 33.9 05/06/2016 0512   RDW 12.7 05/06/2016 0512   LYMPHSABS 3.4 06/30/2015 1022   MONOABS 0.5 06/30/2015 1022   EOSABS 0.6 06/30/2015 1022   BASOSABS 0.0 06/30/2015 1022     Discharge Exam: Blood pressure 107/55, pulse 69, temperature 98.3 F (36.8 C), temperature source Oral, resp. rate 18, height 5' (1.524 m), weight 79.379 kg (175 lb), last menstrual period 04/25/2016, SpO2 93 %.  General appearance: alert, cooperative and no distress Resp: clear to auscultation bilaterally Cardio: regular rate and rhythm, S1, S2 normal, no murmur, click, rub or gallop GI:  soft, non-tender; bowel sounds normal; no masses,  no organomegaly Extremities: extremities normal, atraumatic, no cyanosis or edema  Incision: Clean, dry and intact.   Disposition: 01-Home or Self Care     Medication List    STOP taking these medications        traMADol 50 MG tablet  Commonly known as:  ULTRAM      TAKE these medications        albuterol 108 (90 Base) MCG/ACT inhaler  Commonly known as:  PROVENTIL HFA;VENTOLIN HFA  Inhale 2 puffs into the lungs every 6 (six) hours as needed for wheezing or shortness of breath.     albuterol (2.5 MG/3ML) 0.083% nebulizer solution  Commonly known as:  PROVENTIL  Take 2.5 mg by nebulization every 6 (six) hours as needed for wheezing or shortness of breath.     diphenhydramine-acetaminophen 25-500 MG Tabs tablet  Commonly known as:  TYLENOL PM  Take 2 tablets by mouth 2 (two) times daily as needed (for pain).     docusate sodium 100 MG capsule  Commonly known as:  COLACE  Take 1 capsule (100 mg total) by mouth 2 (two) times daily.     hydrochlorothiazide 25 MG tablet  Commonly known as:  HYDRODIURIL  Take 25 mg by mouth daily.     ibuprofen 600 MG tablet  Commonly known as:  ADVIL,MOTRIN  Take 1 tablet (600 mg total) by mouth every 6 (six) hours as needed for mild pain, moderate pain or cramping (mild pain).     oxyCODONE-acetaminophen 5-325 MG tablet  Commonly known as:  PERCOCET/ROXICET  Take 1-2  tablets by mouth every 4 (four) hours as needed for moderate pain or severe pain.           Follow-up Information    Follow up with Alinda Dooms, MD On 06/16/2016.   Specialty:  Obstetrics and Gynecology   Why:  Appointment time is 1:30 p.m.   Contact information:   [address] [place] [address] [phone]       Signed: Alinda Dooms 05/07/2016, 8:25 AM

## 2016-05-09 NOTE — Anesthesia Postprocedure Evaluation (Signed)
Anesthesia Post Note  Patient: [name]  Procedure(s) Performed: Procedure(s) (LRB): EXPLORATORY LAPAROTOMY WITH ULTRASOUND (N/A) MYOMECTOMY, Biopsy Right Ovary & Peritoneum (N/A)  Patient location during evaluation: PACU Anesthesia Type: General Level of consciousness: awake and alert Pain management: pain level controlled Vital Signs Assessment: post-procedure vital signs reviewed and stable Respiratory status: spontaneous breathing, nonlabored ventilation, respiratory function stable and patient connected to nasal cannula oxygen Cardiovascular status: blood pressure returned to baseline and stable Postop Assessment: no signs of nausea or vomiting Anesthetic complications: no    Last Vitals:  Filed Vitals:   05/06/16 2150 05/07/16 0600  BP: 123/73 107/55  Pulse: 71 69  Temp: 36.6 C 36.8 C  Resp: 20 18    Last Pain:  Filed Vitals:   05/07/16 0626  PainSc: Adwolf Edward Aidel Davisson

## 2016-06-15 ENCOUNTER — Encounter (HOSPITAL_COMMUNITY): Payer: Self-pay | Admitting: Emergency Medicine

## 2016-06-15 ENCOUNTER — Emergency Department (HOSPITAL_COMMUNITY): Payer: BLUE CROSS/BLUE SHIELD

## 2016-06-15 ENCOUNTER — Emergency Department (HOSPITAL_COMMUNITY)
Admission: EM | Admit: 2016-06-15 | Discharge: 2016-06-16 | Disposition: A | Discharge status: Home / Self Care | Payer: BLUE CROSS/BLUE SHIELD | Attending: Emergency Medicine | Admitting: Emergency Medicine

## 2016-06-15 DIAGNOSIS — F319 Bipolar disorder, unspecified: Secondary | ICD-10-CM | POA: Diagnosis not present

## 2016-06-15 DIAGNOSIS — Z7951 Long term (current) use of inhaled steroids: Secondary | ICD-10-CM | POA: Diagnosis not present

## 2016-06-15 DIAGNOSIS — J069 Acute upper respiratory infection, unspecified: Secondary | ICD-10-CM | POA: Insufficient documentation

## 2016-06-15 DIAGNOSIS — Z87891 Personal history of nicotine dependence: Secondary | ICD-10-CM | POA: Insufficient documentation

## 2016-06-15 DIAGNOSIS — Z79899 Other long term (current) drug therapy: Secondary | ICD-10-CM | POA: Diagnosis not present

## 2016-06-15 DIAGNOSIS — J45901 Unspecified asthma with (acute) exacerbation: Secondary | ICD-10-CM | POA: Insufficient documentation

## 2016-06-15 DIAGNOSIS — I10 Essential (primary) hypertension: Secondary | ICD-10-CM | POA: Insufficient documentation

## 2016-06-15 DIAGNOSIS — J45909 Unspecified asthma, uncomplicated: Secondary | ICD-10-CM | POA: Diagnosis present

## 2016-06-15 LAB — CBC WITH DIFFERENTIAL/PLATELET
BASOS PCT: 0 %
Basophils Absolute: 0 10*3/uL (ref 0.0–0.1)
Eosinophils Absolute: 0.4 10*3/uL (ref 0.0–0.7)
Eosinophils Relative: 5 %
HEMATOCRIT: 39.2 % (ref 36.0–46.0)
HEMOGLOBIN: 12.9 g/dL (ref 12.0–15.0)
LYMPHS PCT: 32 %
Lymphs Abs: 2.6 10*3/uL (ref 0.7–4.0)
MCH: 30.9 pg (ref 26.0–34.0)
MCHC: 32.9 g/dL (ref 30.0–36.0)
MCV: 94 fL (ref 78.0–100.0)
MONOS PCT: 5 %
Monocytes Absolute: 0.4 10*3/uL (ref 0.1–1.0)
NEUTROS ABS: 4.9 10*3/uL (ref 1.7–7.7)
NEUTROS PCT: 58 %
Platelets: 323 10*3/uL (ref 150–400)
RBC: 4.17 MIL/uL (ref 3.87–5.11)
RDW: 12.8 % (ref 11.5–15.5)
WBC: 8.3 10*3/uL (ref 4.0–10.5)

## 2016-06-15 LAB — BASIC METABOLIC PANEL
ANION GAP: 8 (ref 5–15)
BUN: 6 mg/dL (ref 6–20)
CALCIUM: 9.2 mg/dL (ref 8.9–10.3)
CHLORIDE: 110 mmol/L (ref 101–111)
CO2: 22 mmol/L (ref 22–32)
Creatinine, Ser: 0.93 mg/dL (ref 0.44–1.00)
GFR calc non Af Amer: >60 mL/min (ref >60)
Glucose, Bld: 86 mg/dL (ref 65–99)
Potassium: 3.7 mmol/L (ref 3.5–5.1)
Sodium: 140 mmol/L (ref 135–145)

## 2016-06-15 LAB — URINALYSIS, ROUTINE W REFLEX MICROSCOPIC
Bilirubin Urine: NEGATIVE
Glucose, UA: NEGATIVE mg/dL
Hgb urine dipstick: NEGATIVE
KETONES UR: NEGATIVE mg/dL
LEUKOCYTES UA: NEGATIVE
NITRITE: NEGATIVE
PH: 7 (ref 5.0–8.0)
Protein, ur: NEGATIVE mg/dL
Specific Gravity, Urine: 1.016 (ref 1.005–1.030)

## 2016-06-15 LAB — PREGNANCY, URINE: Preg Test, Ur: NEGATIVE

## 2016-06-15 MED ORDER — METHYLPREDNISOLONE SODIUM SUCC 125 MG IJ SOLR
125.0000 mg | Freq: Once | INTRAMUSCULAR | Status: AC
  Administered 2016-06-15: 125 mg via INTRAVENOUS
  Filled 2016-06-15: qty 2

## 2016-06-15 MED ORDER — SODIUM CHLORIDE 0.9 % IV BOLUS (SEPSIS)
1000.0000 mL | Freq: Once | INTRAVENOUS | Status: AC
  Administered 2016-06-15: 1000 mL via INTRAVENOUS

## 2016-06-15 MED ORDER — ALBUTEROL SULFATE (2.5 MG/3ML) 0.083% IN NEBU
5.0000 mg | INHALATION_SOLUTION | Freq: Once | RESPIRATORY_TRACT | Status: AC
  Administered 2016-06-15: 5 mg via RESPIRATORY_TRACT
  Filled 2016-06-15: qty 6

## 2016-06-15 MED ORDER — IPRATROPIUM-ALBUTEROL 0.5-2.5 (3) MG/3ML IN SOLN
3.0000 mL | Freq: Once | RESPIRATORY_TRACT | Status: AC
  Administered 2016-06-15: 3 mL via RESPIRATORY_TRACT
  Filled 2016-06-15: qty 3

## 2016-06-15 MED ORDER — KETOROLAC TROMETHAMINE 30 MG/ML IJ SOLN
30.0000 mg | Freq: Once | INTRAMUSCULAR | Status: AC
  Administered 2016-06-15: 30 mg via INTRAVENOUS
  Filled 2016-06-15: qty 1

## 2016-06-15 NOTE — ED Provider Notes (Signed)
CSN: [id number]     Arrival date & time 06/15/16  1851 History   First MD Initiated Contact with Patient 06/15/16 2037     Chief Complaint  Patient presents with  . Asthma     (Consider location/radiation/quality/duration/timing/severity/associated sxs/prior Treatment) HPI 40 year old female who presents with shortness of breath. History of asthma, hypertension, migraine headaches and bipolar disorder. States that yesterday began to have some nasal congestion and cough productive of yellow sputum. Initially thought that this may have been her allergies and try taking Zyrtec without relief. Has had worsening shortness of breath since yesterday evening, not resolved by breathing treatments and albuterol inhaler. Subsequently came to the ED for evaluation. Has not had fever, lower extremity edema, calf tenderness, recent immobilizations, history or family history of PE/DVT. States that this seems consistent with her asthma exacerbations, although very severe. Has not needed steroids in several years, and states that she has been hospitalized 7 years ago for asthma, but overall well controlled.    Past Medical History  Diagnosis Date  . Bipolar 1 disorder, depressed, mild ([place])   . Migraines   . GERD (gastroesophageal reflux disease)   . Bilateral ovarian cysts   . Asthma   . Pregnancy induced hypertension   . HTN (hypertension)   . Anemia   . Seizures ([place])     during labor just that one time  . History of blood transfusion 1994    during delivery   Past Surgical History  Procedure Laterality Date  . Cesarean section    . Induced abortion    . Tubal ligation    . Laparotomy N/A 05/05/2016    Procedure: EXPLORATORY LAPAROTOMY WITH ULTRASOUND;  Surgeon: Waymon Amato, MD;  Location: [facility];  Service: Gynecology;  Laterality: N/A;  needs ultrasound ...Marland KitchenMarland KitchenMarland Kitchenlysis of adhesions   . Myomectomy N/A 05/05/2016    Procedure: MYOMECTOMY, Biopsy Right Ovary & Peritoneum;  Surgeon: Waymon Amato, MD;   Location: [facility];  Service: Gynecology;  Laterality: N/A;   Family History  Problem Relation Age of Onset  . Asthma Mother   . Asthma Brother    Social History  Substance Use Topics  . Smoking status: Former Smoker -- 0.25 packs/day for 20 years    Types: Cigarettes    Quit date: 04/09/2014  . Smokeless tobacco: Never Used  . Alcohol Use: Yes     Comment: twice a year   OB History    Gravida Para Term Preterm AB TAB SAB Ectopic Multiple Living   3 2 0 1 1 1    2      Review of Systems 10/14 systems reviewed and are negative other than those stated in the HPI    Allergies  Vicodin and Prednisone  Home Medications   Prior to Admission medications   Medication Sig Start Date End Date Taking? Authorizing Provider  albuterol (PROVENTIL HFA;VENTOLIN HFA) 108 (90 Base) MCG/ACT inhaler Inhale 2 puffs into the lungs every 6 (six) hours as needed for wheezing or shortness of breath.   Yes Historical Provider, MD  albuterol (PROVENTIL) (2.5 MG/3ML) 0.083% nebulizer solution Take 2.5 mg by nebulization every 6 (six) hours as needed for wheezing or shortness of breath.   Yes Historical Provider, MD  cetirizine (ZYRTEC) 10 MG tablet Take 10 mg by mouth daily as needed for allergies.   Yes Historical Provider, MD  docusate sodium (COLACE) 100 MG capsule Take 1 capsule (100 mg total) by mouth 2 (two) times daily. 05/07/16  Yes Ema Alesia Richards,  MD  hydrochlorothiazide (HYDRODIURIL) 25 MG tablet Take 25 mg by mouth daily.   Yes Historical Provider, MD  ibuprofen (ADVIL,MOTRIN) 600 MG tablet Take 1 tablet (600 mg total) by mouth every 6 (six) hours as needed for mild pain, moderate pain or cramping (mild pain). 05/07/16  Yes Waymon Amato, MD  Oxycodone HCl 10 MG TABS TK 1 T PO  TID PRN P 06/06/16  Yes Historical Provider, MD  oxyCODONE-acetaminophen (PERCOCET/ROXICET) 5-325 MG tablet Take 1-2 tablets by mouth every 4 (four) hours as needed for moderate pain or severe pain. 05/07/16  Yes Waymon Amato, MD   albuterol (PROVENTIL) (5 MG/ML) 0.5% nebulizer solution Take 0.5 mLs (2.5 mg total) by nebulization every 4 (four) hours as needed for wheezing or shortness of breath. 06/16/16   Forde Dandy, MD  diphenhydramine-acetaminophen (TYLENOL PM) 25-500 MG TABS Take 2 tablets by mouth 2 (two) times daily as needed (for pain).     Historical Provider, MD  ipratropium (ATROVENT) 0.02 % nebulizer solution Take 2.5 mLs (0.5 mg total) by nebulization every 6 (six) hours as needed for wheezing or shortness of breath. 06/16/16   Forde Dandy, MD  predniSONE (DELTASONE) 10 MG tablet Take 5 tablets (50 mg total) by mouth daily. 06/16/16   Forde Dandy, MD   BP 126/85 mmHg  Pulse 115  Temp(Src)   Resp 21  SpO2 96%  LMP 05/30/2016 Physical Exam Physical Exam  Nursing note and vitals reviewed. Constitutional: Well developed, well nourished, non-toxic, anxious and tearful Head: Normocephalic and atraumatic.  Mouth/Throat: Oropharynx is clear and moist.  Neck: Normal range of motion. Neck supple.  Cardiovascular: Tachycardic rate and regular rhythm.   Pulmonary/Chest: Mild tachypnea, no accessory muscle usage, no conversational dyspnea, diffuse inspiratory and expiratory wheezing throughout all lung fields. Abdominal: Soft. There is no tenderness. There is no rebound and no guarding.  Musculoskeletal: Normal range of motion.  no lower extremity edema, no calf tenderness.  Neurological: Alert, no facial droop, fluent speech, moves all extremities symmetrically Skin: Skin is warm and dry.  Psychiatric: Cooperative  ED Course  Procedures (including critical care time) Labs Review Labs Reviewed  CBC WITH DIFFERENTIAL/PLATELET  BASIC METABOLIC PANEL  URINALYSIS, ROUTINE W REFLEX MICROSCOPIC (NOT AT [facility])  PREGNANCY, URINE    Imaging Review Dg Chest 2 View  06/15/2016  CLINICAL DATA:  Asthma attack.  Shortness of breath and chest pain. EXAM: CHEST  2 VIEW COMPARISON:  March 20, 2015 FINDINGS: The heart size  and mediastinal contours are within normal limits. Both lungs are clear. The visualized skeletal structures are unremarkable. IMPRESSION: No active cardiopulmonary disease. Electronically Signed   By: Dorise Bullion III M.D   On: 06/15/2016 21:21   I have personally reviewed and evaluated these images and lab results as part of my medical decision-making.   EKG Interpretation   Date/Time:  Wednesday June 15 2016 19:12:47 EDT Ventricular Rate:  105 PR Interval:    QRS Duration: 87 QT Interval:  336 QTC Calculation: 444 R Axis:   70 Text Interpretation:  Sinus tachycardia RSR' in V1 or V2, right VCD or RVH  Baseline wander in lead(s) II Aside from tachycadia, no significant  changes  Confirmed by Tierany Appleby MD, Hinton Dyer [id number]) on 06/15/2016 8:58:20 PM      MDM   Final diagnoses:  Asthma exacerbation  Upper respiratory infection   40 year old female with history of asthma who presents with asthma exacerbation and URI symptoms. Had received one albuterol neb  prior to my evaluation. Has tachycardia, mild tachypnea, but no hypoxia, no conversational dyspnea. With inspiratory and expiratory wheezing diffusely. Presentation seems consistent with an acute asthma exacerbation. Chest x-ray without acute cardiopulmonary processes. Received Atrovent with 2 additional behavioral breathing treatments, on my reevaluation she states that her shortness of breath is significantly improved. She has good air movement throughout, with occasional scattered wheeze. Ambulates normally without hypoxia or significant dyspnea. After receiving breathing treatments she has become tachycardic with heart rate in the 120s to 130s. This did improve after receiving IV fluids and under observation here in the emergency department. I feel that she is appropriate for outpatient treatment of her asthma exacerbation at this time. It is discharged home with refills for her breathing treatments as well as a course of prednisone. Although her  allergy states that she has swelling and rash with prednisone she states that she has lower extremity edema associated with this that is not allergic in nature, just side of effect of medication. She states that she has tolerated prednisone without difficulty.   Strict return and follow-up instructions reviewed. She expressed understanding of all discharge instructions and felt comfortable with the plan of care.     Forde Dandy, MD 06/16/16 [phone]

## 2016-06-15 NOTE — ED Notes (Signed)
Pt c/o asthma exacerbation onset today. Wheezing, CP, difficulty speaking.

## 2016-06-16 MED ORDER — PREDNISONE 10 MG PO TABS: 50.0000 mg | ORAL_TABLET | Freq: Every day | ORAL | Status: DC

## 2016-06-16 MED ORDER — SODIUM CHLORIDE 0.9 % IV BOLUS (SEPSIS)
1000.0000 mL | Freq: Once | INTRAVENOUS | Status: AC
  Administered 2016-06-16: 1000 mL via INTRAVENOUS

## 2016-06-16 MED ORDER — ALBUTEROL SULFATE (5 MG/ML) 0.5% IN NEBU: 2.5000 mg | INHALATION_SOLUTION | RESPIRATORY_TRACT | Status: DC | PRN

## 2016-06-16 MED ORDER — IPRATROPIUM BROMIDE 0.02 % IN SOLN: 0.5000 mg | Freq: Four times a day (QID) | RESPIRATORY_TRACT | Status: DC | PRN

## 2016-06-16 NOTE — ED Notes (Signed)
Pt. Ambulated down the hall from the bathroom to her room on 96% room air and heart rate 133. Pt. Gait steady on her feet.

## 2016-06-16 NOTE — ED Notes (Signed)
Pt reports improvement in breathing and decreased chest tightness after albuterol treatment. Pt still reports productive cough with yellow sputum.

## 2016-06-16 NOTE — Discharge Instructions (Signed)
For the first day, give yourself breathing treatments (albuterol) every 3-4 hours scheduled and atrovent every 6 hours. Then use it as needed for wheezing and shortness of breath. Take steroids as prescribed.   Return without fail for worsening symptoms, including difficulty breathing, severe chest pain, passing out, or any other symptoms concerning to you.   Viral Infections A virus is a type of germ. Viruses can cause:  Minor sore throats.  Aches and pains.  Headaches.  Runny nose.  Rashes.  Watery eyes.  Tiredness.  Coughs.  Loss of appetite.  Feeling sick to your stomach (nausea).  Throwing up (vomiting).  Watery poop (diarrhea). HOME CARE   Only take medicines as told by your doctor.  Drink enough water and fluids to keep your pee (urine) clear or pale yellow. Sports drinks are a good choice.  Get plenty of rest and eat healthy. Soups and broths with crackers or rice are fine. GET HELP RIGHT AWAY IF:   You have a very bad headache.  You have shortness of breath.  You have chest pain or neck pain.  You have an unusual rash.  You cannot stop throwing up.  You have watery poop that does not stop.  You cannot keep fluids down.  You or your child has a temperature by mouth above 102 F (38.9 C), not controlled by medicine.  Your baby is older than 3 months with a rectal temperature of 102 F (38.9 C) or higher.  Your baby is 85 months old or younger with a rectal temperature of 100.4 F (38 C) or higher. MAKE SURE YOU:   Understand these instructions.  Will watch this condition.  Will get help right away if you are not doing well or get worse.   This information is not intended to replace advice given to you by your health care provider. Make sure you discuss any questions you have with your health care provider.   Document Released: 11/17/2008 Document Revised: 02/27/2012 Document Reviewed: 05/13/2015 Elsevier Interactive Patient Education  2016 [place].  Asthma, Adult Asthma is a recurring condition in which the airways tighten and narrow. Asthma can make it difficult to breathe. It can cause coughing, wheezing, and shortness of breath. Asthma episodes, also called asthma attacks, range from minor to life-threatening. Asthma cannot be cured, but medicines and lifestyle changes can help control it. CAUSES Asthma is believed to be caused by inherited (genetic) and environmental factors, but its exact cause is unknown. Asthma may be triggered by allergens, lung infections, or irritants in the air. Asthma triggers are different for each person. Common triggers include:   Animal dander.  Dust mites.  Cockroaches.  Pollen from trees or grass.  Mold.  Smoke.  Air pollutants such as dust, household cleaners, hair sprays, aerosol sprays, paint fumes, strong chemicals, or strong odors.  Cold air, weather changes, and winds (which increase molds and pollens in the air).  Strong emotional expressions such as crying or laughing hard.  Stress.  Certain medicines (such as aspirin) or types of drugs (such as beta-blockers).  Sulfites in foods and drinks. Foods and drinks that may contain sulfites include dried fruit, potato chips, and sparkling grape juice.  Infections or inflammatory conditions such as the flu, a cold, or an inflammation of the nasal membranes (rhinitis).  Gastroesophageal reflux disease (GERD).  Exercise or strenuous activity. SYMPTOMS Symptoms may occur immediately after asthma is triggered or many hours later. Symptoms include:  Wheezing.  Excessive nighttime or early morning  coughing.  Frequent or severe coughing with a common cold.  Chest tightness.  Shortness of breath. DIAGNOSIS  The diagnosis of asthma is made by a review of your medical history and a physical exam. Tests may also be performed. These may include:  Lung function studies. These tests show how much air you breathe in and  out.  Allergy tests.  Imaging tests such as X-rays. TREATMENT  Asthma cannot be cured, but it can usually be controlled. Treatment involves identifying and avoiding your asthma triggers. It also involves medicines. There are 2 classes of medicine used for asthma treatment:   Controller medicines. These prevent asthma symptoms from occurring. They are usually taken every day.  Reliever or rescue medicines. These quickly relieve asthma symptoms. They are used as needed and provide short-term relief. Your health care provider will help you create an asthma action plan. An asthma action plan is a written plan for managing and treating your asthma attacks. It includes a list of your asthma triggers and how they may be avoided. It also includes information on when medicines should be taken and when their dosage should be changed. An action plan may also involve the use of a device called a peak flow meter. A peak flow meter measures how well the lungs are working. It helps you monitor your condition. HOME CARE INSTRUCTIONS   Take medicines only as directed by your health care provider. Speak with your health care provider if you have questions about how or when to take the medicines.  Use a peak flow meter as directed by your health care provider. Record and keep track of readings.  Understand and use the action plan to help minimize or stop an asthma attack without needing to seek medical care.  Control your home environment in the following ways to help prevent asthma attacks:  Do not smoke. Avoid being exposed to secondhand smoke.  Change your heating and air conditioning filter regularly.  Limit your use of fireplaces and wood stoves.  Get rid of pests (such as roaches and mice) and their droppings.  Throw away plants if you see mold on them.  Clean your floors and dust regularly. Use unscented cleaning products.  Try to have someone else vacuum for you regularly. Stay out of rooms  while they are being vacuumed and for a short while afterward. If you vacuum, use a dust mask from a hardware store, a double-layered or microfilter vacuum cleaner bag, or a vacuum cleaner with a HEPA filter.  Replace carpet with wood, tile, or vinyl flooring. Carpet can trap dander and dust.  Use allergy-proof pillows, mattress covers, and box spring covers.  Wash bed sheets and blankets every week in hot water and dry them in a dryer.  Use blankets that are made of polyester or cotton.  Clean bathrooms and kitchens with bleach. If possible, have someone repaint the walls in these rooms with mold-resistant paint. Keep out of the rooms that are being cleaned and painted.  Wash hands frequently. SEEK MEDICAL CARE IF:   You have wheezing, shortness of breath, or a cough even if taking medicine to prevent attacks.  The colored mucus you cough up (sputum) is thicker than usual.  Your sputum changes from clear or white to yellow, green, gray, or bloody.  You have any problems that may be related to the medicines you are taking (such as a rash, itching, swelling, or trouble breathing).  You are using a reliever medicine more than 2-3 times  per week.  Your peak flow is still at 50-79% of your personal best after following your action plan for 1 hour.  You have a fever. SEEK IMMEDIATE MEDICAL CARE IF:   You seem to be getting worse and are unresponsive to treatment during an asthma attack.  You are short of breath even at rest.  You get short of breath when doing very little physical activity.  You have difficulty eating, drinking, or talking due to asthma symptoms.  You develop chest pain.  You develop a fast heartbeat.  You have a bluish color to your lips or fingernails.  You are light-headed, dizzy, or faint.  Your peak flow is less than 50% of your personal best.   This information is not intended to replace advice given to you by your health care provider. Make sure you  discuss any questions you have with your health care provider.   Document Released: 12/05/2005 Document Revised: 08/26/2015 Document Reviewed: 07/04/2013 Elsevier Interactive Patient Education [employer].

## 2016-06-16 NOTE — ED Notes (Signed)
Dr. Liu at bedside to evaluate pt.

## 2016-10-03 MED FILL — VENTOLIN HFA 90 MCG INHALER: 108 (90 BAS | 25 days supply | Qty: 18 | Fill #1

## 2016-12-02 ENCOUNTER — Encounter (HOSPITAL_COMMUNITY): Payer: Self-pay

## 2016-12-02 ENCOUNTER — Emergency Department (HOSPITAL_COMMUNITY)
Admission: EM | Admit: 2016-12-02 | Discharge: 2016-12-02 | Disposition: A | Discharge status: Home / Self Care | Payer: BLUE CROSS/BLUE SHIELD | Attending: Emergency Medicine | Admitting: Emergency Medicine

## 2016-12-02 DIAGNOSIS — Z79899 Other long term (current) drug therapy: Secondary | ICD-10-CM | POA: Insufficient documentation

## 2016-12-02 DIAGNOSIS — X58XXXA Exposure to other specified factors, initial encounter: Secondary | ICD-10-CM | POA: Insufficient documentation

## 2016-12-02 DIAGNOSIS — J45909 Unspecified asthma, uncomplicated: Secondary | ICD-10-CM | POA: Insufficient documentation

## 2016-12-02 DIAGNOSIS — Z87891 Personal history of nicotine dependence: Secondary | ICD-10-CM | POA: Insufficient documentation

## 2016-12-02 DIAGNOSIS — I10 Essential (primary) hypertension: Secondary | ICD-10-CM | POA: Insufficient documentation

## 2016-12-02 DIAGNOSIS — S161XXA Strain of muscle, fascia and tendon at neck level, initial encounter: Secondary | ICD-10-CM

## 2016-12-02 DIAGNOSIS — G44209 Tension-type headache, unspecified, not intractable: Secondary | ICD-10-CM | POA: Insufficient documentation

## 2016-12-02 DIAGNOSIS — Y929 Unspecified place or not applicable: Secondary | ICD-10-CM | POA: Insufficient documentation

## 2016-12-02 DIAGNOSIS — Y939 Activity, unspecified: Secondary | ICD-10-CM | POA: Insufficient documentation

## 2016-12-02 DIAGNOSIS — Y999 Unspecified external cause status: Secondary | ICD-10-CM | POA: Insufficient documentation

## 2016-12-02 MED ORDER — DIPHENHYDRAMINE HCL 50 MG/ML IJ SOLN
25.0000 mg | Freq: Once | INTRAMUSCULAR | Status: AC
  Administered 2016-12-02: 25 mg via INTRAVENOUS
  Filled 2016-12-02: qty 1

## 2016-12-02 MED ORDER — LIDOCAINE 5 % EX PTCH
1.0000 | MEDICATED_PATCH | CUTANEOUS | Status: DC
  Administered 2016-12-02: 1 via TRANSDERMAL
  Filled 2016-12-02: qty 1

## 2016-12-02 MED ORDER — METHOCARBAMOL 500 MG PO TABS: 500.0000 mg | ORAL_TABLET | Freq: Two times a day (BID) | ORAL | 0 refills | Status: DC | PRN

## 2016-12-02 MED ORDER — SODIUM CHLORIDE 0.9 % IV BOLUS (SEPSIS)
1000.0000 mL | Freq: Once | INTRAVENOUS | Status: AC
  Administered 2016-12-02: 1000 mL via INTRAVENOUS

## 2016-12-02 MED ORDER — DIAZEPAM 5 MG/ML IJ SOLN
2.5000 mg | Freq: Once | INTRAMUSCULAR | Status: AC
Start: 2016-12-02 — End: 2016-12-02
  Administered 2016-12-02: 2.5 mg via INTRAVENOUS
  Filled 2016-12-02: qty 2

## 2016-12-02 MED ORDER — METOCLOPRAMIDE HCL 5 MG/ML IJ SOLN
10.0000 mg | Freq: Once | INTRAMUSCULAR | Status: AC
Start: 2016-12-02 — End: 2016-12-02
  Administered 2016-12-02: 10 mg via INTRAVENOUS
  Filled 2016-12-02: qty 2

## 2016-12-02 MED ORDER — IBUPROFEN 600 MG PO TABS: 600.0000 mg | ORAL_TABLET | Freq: Four times a day (QID) | ORAL | 0 refills | Status: DC | PRN

## 2016-12-02 NOTE — ED Triage Notes (Signed)
Pt presents for evaluation of headache since yesterday AM with visual changes to L eye. Pt. Was able to drive self to ED, reports blurred vision at times when she looks around. Pt. States hx of migraines, no meds at home. Pt AxO x4, states slight nausea, denies loss of bowel/bladder or other neuro deficits.

## 2016-12-02 NOTE — ED Provider Notes (Signed)
[facility] DEPT Provider Note   CSN: [id number] Arrival date & time: 12/02/16  1059     History   Chief Complaint Chief Complaint  Patient presents with  . Migraine  . Blurred Vision    HPI [name] is a 40 y.o. female.  The history is provided by the patient and medical records. No language interpreter was used.  Migraine  Associated symptoms include headaches. Pertinent negatives include no abdominal pain and no shortness of breath.    [name] is a 40 y.o. female  who presents to the Emergency Department complaining of headache c/w typical migraines which began yesterday morning and has progressively worsened. Described as bilateral R>L. + nausea, no emesis. She took two tylenol PM with no relief. Also endorses right sided neck pain which she believes is 2/2 sleeping oddly a few nights ago. No fevers, midline neck pain, chest pain, shortness of breath, vision loss, slurred speech, muscle weakness.    Past Medical History:  Diagnosis Date  . Anemia   . Asthma   . Bilateral ovarian cysts   . Bipolar 1 disorder, depressed, mild ([place])   . GERD (gastroesophageal reflux disease)   . History of blood transfusion 1994   during delivery  . HTN (hypertension)   . Migraines   . Pregnancy induced hypertension   . Seizures ([place])    during labor just that one time    Patient Active Problem List   Diagnosis Date Noted  . Fibroids 05/07/2016  . Chronic pelvic pain in female 05/07/2016  . Fibroid, uterine 05/05/2016  . Acute bronchitis   . Hypokalemia   . HTN (hypertension) 09/02/2013  . Asthma exacerbation 09/01/2013  . Tobacco abuse 09/01/2013    Past Surgical History:  Procedure Laterality Date  . CESAREAN SECTION    . INDUCED ABORTION    . LAPAROTOMY N/A 05/05/2016   Procedure: EXPLORATORY LAPAROTOMY WITH ULTRASOUND;  Surgeon: Waymon Amato, MD;  Location: [facility];  Service: Gynecology;  Laterality: N/A;  needs ultrasound ...Marland KitchenMarland KitchenMarland Kitchenlysis of adhesions   .  MYOMECTOMY N/A 05/05/2016   Procedure: MYOMECTOMY, Biopsy Right Ovary & Peritoneum;  Surgeon: Waymon Amato, MD;  Location: [facility];  Service: Gynecology;  Laterality: N/A;  . TUBAL LIGATION      OB History    Gravida Para Term Preterm AB Living   3 2 0 1 1 2    SAB TAB Ectopic Multiple Live Births     1             Home Medications    Prior to Admission medications   Medication Sig Start Date End Date Taking? Authorizing Provider  albuterol (PROVENTIL HFA;VENTOLIN HFA) 108 (90 Base) MCG/ACT inhaler Inhale 2 puffs into the lungs every 6 (six) hours as needed for wheezing or shortness of breath.    Historical Provider, MD  albuterol (PROVENTIL) (2.5 MG/3ML) 0.083% nebulizer solution Take 2.5 mg by nebulization every 6 (six) hours as needed for wheezing or shortness of breath.    Historical Provider, MD  albuterol (PROVENTIL) (5 MG/ML) 0.5% nebulizer solution Take 0.5 mLs (2.5 mg total) by nebulization every 4 (four) hours as needed for wheezing or shortness of breath. 06/16/16   Forde Dandy, MD  cetirizine (ZYRTEC) 10 MG tablet Take 10 mg by mouth daily as needed for allergies.    Historical Provider, MD  diphenhydramine-acetaminophen (TYLENOL PM) 25-500 MG TABS Take 2 tablets by mouth 2 (two) times daily as needed (for pain).  Historical Provider, MD  docusate sodium (COLACE) 100 MG capsule Take 1 capsule (100 mg total) by mouth 2 (two) times daily. 05/07/16   Waymon Amato, MD  hydrochlorothiazide (HYDRODIURIL) 25 MG tablet Take 25 mg by mouth daily.    Historical Provider, MD  ibuprofen (ADVIL,MOTRIN) 600 MG tablet Take 1 tablet (600 mg total) by mouth every 6 (six) hours as needed for moderate pain. 12/02/16   Ozella Almond Ward, PA-C  ipratropium (ATROVENT) 0.02 % nebulizer solution Take 2.5 mLs (0.5 mg total) by nebulization every 6 (six) hours as needed for wheezing or shortness of breath. 06/16/16   Forde Dandy, MD  methocarbamol (ROBAXIN) 500 MG tablet Take 1 tablet (500 mg total) by mouth  2 (two) times daily as needed for muscle spasms. 12/02/16   Ozella Almond Ward, PA-C  Oxycodone HCl 10 MG TABS TK 1 T PO  TID PRN P 06/06/16   Historical Provider, MD  oxyCODONE-acetaminophen (PERCOCET/ROXICET) 5-325 MG tablet Take 1-2 tablets by mouth every 4 (four) hours as needed for moderate pain or severe pain. 05/07/16   Waymon Amato, MD  predniSONE (DELTASONE) 10 MG tablet Take 5 tablets (50 mg total) by mouth daily. 06/16/16   Forde Dandy, MD    Family History Family History  Problem Relation Age of Onset  . Asthma Mother   . Asthma Brother     Social History Social History  Substance Use Topics  . Smoking status: Former Smoker    Packs/day: 0.25    Years: 20.00    Types: Cigarettes    Quit date: 04/09/2014  . Smokeless tobacco: Never Used  . Alcohol use Yes     Comment: twice a year     Allergies   Vicodin [hydrocodone-acetaminophen] and Prednisone   Review of Systems Review of Systems  Constitutional: Negative for chills and fever.  HENT: Negative for congestion.   Eyes: Negative for visual disturbance.  Respiratory: Negative for cough and shortness of breath.   Cardiovascular: Negative.   Gastrointestinal: Positive for nausea. Negative for abdominal pain and vomiting.  Genitourinary: Negative for dysuria.  Musculoskeletal: Positive for neck pain. Negative for back pain.  Skin: Negative for rash.  Neurological: Positive for headaches. Negative for dizziness, weakness and numbness.     Physical Exam Updated Vital Signs BP 114/73   Pulse 68   Temp 97.9 F (36.6 C) (Oral)   Resp 16   Ht 5' (1.524 m)   Wt 79.4 kg   LMP 11/18/2016 (Exact Date)   SpO2 96%   BMI 34.18 kg/m   Physical Exam  Constitutional: She is oriented to person, place, and time. She appears well-developed and well-nourished. No distress.  HENT:  Head: Normocephalic and atraumatic.  Mouth/Throat: Oropharynx is clear and moist.  Eyes: Conjunctivae and EOM are normal. Pupils are equal,  round, and reactive to light. No scleral icterus.  No nystagmus   Neck: Normal range of motion. Neck supple.  No midline tenderness. TTP of right paraspinal musculature and trapezius. Full ROM. No nuchal rigidity or meningeal signs.  Cardiovascular: Normal rate, regular rhythm, normal heart sounds and intact distal pulses.   Pulmonary/Chest: Effort normal and breath sounds normal. No respiratory distress. She has no wheezes. She has no rales.  Abdominal: Soft. She exhibits no distension. There is no tenderness.  Musculoskeletal: Normal range of motion.  Lymphadenopathy:    She has no cervical adenopathy.  Neurological: She is alert and oriented to person, place, and time. She has normal reflexes.  No cranial nerve deficit. Coordination normal.  Mental Status: Alert, oriented, and thought content is appropriate. Speech is fluent without evidence of aphasia. Able to follow two-step commands without difficulty.  Cranial Nerves:  II - Peripheral visual fields grossly normal, pupils equal, round, reactive to light III, IV, VI - Bilateral EOM intact, no ptosis V - Facial light touch sensation intact and equal VII - Facial symmetry: smile, raised eyebrows ; Eyelids kept closed against resistance VIII - Hearing grossly normal bilaterally  IX, X - Uvula midline XI - Bilateral shoulder shrug equal and strong XII - Tongue extension midline Motor:  5/5 muscle strength of upper and lower extremities bilaterally including strong and equal grip strength and plantar/dorsiflexion.  Sensory:  Light touch sensory intact.   Skin: Skin is warm and dry. No rash noted. She is not diaphoretic.  Psychiatric: She has a normal mood and affect. Her behavior is normal. Judgment and thought content normal.  Nursing note and vitals reviewed.    ED Treatments / Results  Labs (all labs ordered are listed, but only abnormal results are displayed) Labs Reviewed - No data to display  EKG  EKG Interpretation None         Radiology No results found.  Procedures Procedures (including critical care time)  Medications Ordered in ED Medications  lidocaine (LIDODERM) 5 % 1 patch (1 patch Transdermal Patch Applied 12/02/16 1519)  sodium chloride 0.9 % bolus 1,000 mL (0 mLs Intravenous Stopped 12/02/16 1409)  metoCLOPramide (REGLAN) injection 10 mg (10 mg Intravenous Given 12/02/16 1259)  diphenhydrAMINE (BENADRYL) injection 25 mg (25 mg Intravenous Given 12/02/16 1259)  diazepam (VALIUM) injection 2.5 mg (2.5 mg Intravenous Given 12/02/16 1259)     Initial Impression / Assessment and Plan / ED Course  I have reviewed the triage vital signs and the nursing notes.  Pertinent labs & imaging results that were available during my care of the patient were reviewed by me and considered in my medical decision making (see chart for details).  Clinical Course     AESHA MIESSE presents to ED for headache. No focal neuro deficits on exam. Has tenderness to right paraspinal musculature and trap which appears to be contributory to headache.   Therapeutics: Fluids, reglan, benadryl and valium given and patient had noticeable improvement. Headache resolved, neck pain persisted. The patient denies any neurologic symptoms such as visual changes, focal numbness/weakness, balance problems, confusion, or speech difficulty to suggest a life-threatening intracranial process such as intracranial hemorrhage or mass. I feel that the patient is safe for discharge home at this time. PCP follow up strongly encouraged. Rx for robaxin and home care instructions for muscle strain discussed.  I have reviewed return precautions including development of neurologic symptoms, confusion, lethargy, or difficulty speaking and patient has voiced understanding. All questions answered.   Final Clinical Impressions(s) / ED Diagnoses   Final diagnoses:  Strain of neck muscle, initial encounter  Acute non intractable tension-type headache     New Prescriptions Discharge Medication List as of 12/02/2016  3:25 PM    START taking these medications   Details  methocarbamol (ROBAXIN) 500 MG tablet Take 1 tablet (500 mg total) by mouth 2 (two) times daily as needed for muscle spasms., Starting Fri 12/02/2016, Print         [employer] Ward, PA-C 12/02/16 [place], MD 12/03/16 0800

## 2016-12-02 NOTE — ED Notes (Signed)
Pt. Ambulatory to restroom with steady gait.  

## 2016-12-02 NOTE — Discharge Instructions (Signed)
Ibuprofen as needed for pain. Robaxin is your muscle relaxer to take as needed. Ice or heat to affected area for additional pain relief. Follow up with your primary care provider if symptoms do not improve. Return to ER for new or worsening symptoms, any additional concerns.

## 2017-09-10 ENCOUNTER — Inpatient Hospital Stay (HOSPITAL_COMMUNITY): Payer: Self-pay

## 2017-09-10 ENCOUNTER — Encounter (HOSPITAL_COMMUNITY): Payer: Self-pay | Admitting: *Deleted

## 2017-09-10 ENCOUNTER — Inpatient Hospital Stay (HOSPITAL_COMMUNITY)
Admission: AD | Admit: 2017-09-10 | Discharge: 2017-09-10 | Disposition: A | Discharge status: Home / Self Care | Payer: Self-pay | Source: Ambulatory Visit | Attending: Obstetrics and Gynecology | Admitting: Obstetrics and Gynecology

## 2017-09-10 DIAGNOSIS — R1031 Right lower quadrant pain: Secondary | ICD-10-CM | POA: Insufficient documentation

## 2017-09-10 DIAGNOSIS — Z87891 Personal history of nicotine dependence: Secondary | ICD-10-CM | POA: Insufficient documentation

## 2017-09-10 DIAGNOSIS — Z3202 Encounter for pregnancy test, result negative: Secondary | ICD-10-CM | POA: Insufficient documentation

## 2017-09-10 LAB — CBC
HEMATOCRIT: 40.4 % (ref 36.0–46.0)
HEMOGLOBIN: 13.8 g/dL (ref 12.0–15.0)
MCH: 31.4 pg (ref 26.0–34.0)
MCHC: 34.2 g/dL (ref 30.0–36.0)
MCV: 91.8 fL (ref 78.0–100.0)
Platelets: 378 10*3/uL (ref 150–400)
RBC: 4.4 MIL/uL (ref 3.87–5.11)
RDW: 12.6 % (ref 11.5–15.5)
WBC: 7.6 10*3/uL (ref 4.0–10.5)

## 2017-09-10 LAB — URINALYSIS, ROUTINE W REFLEX MICROSCOPIC
Bilirubin Urine: NEGATIVE
Glucose, UA: NEGATIVE mg/dL
Hgb urine dipstick: NEGATIVE
KETONES UR: NEGATIVE mg/dL
LEUKOCYTES UA: NEGATIVE
Nitrite: NEGATIVE
PH: 6 (ref 5.0–8.0)
Protein, ur: NEGATIVE mg/dL
SPECIFIC GRAVITY, URINE: 1.023 (ref 1.005–1.030)

## 2017-09-10 LAB — COMPREHENSIVE METABOLIC PANEL
ALK PHOS: 46 U/L (ref 38–126)
ALT: 13 U/L — AB (ref 14–54)
AST: 18 U/L (ref 15–41)
Albumin: 4.1 g/dL (ref 3.5–5.0)
Anion gap: 6 (ref 5–15)
BUN: 12 mg/dL (ref 6–20)
CALCIUM: 8.8 mg/dL — AB (ref 8.9–10.3)
CHLORIDE: 104 mmol/L (ref 101–111)
CO2: 26 mmol/L (ref 22–32)
CREATININE: 0.99 mg/dL (ref 0.44–1.00)
Glucose, Bld: 97 mg/dL (ref 65–99)
Potassium: 3.7 mmol/L (ref 3.5–5.1)
Sodium: 136 mmol/L (ref 135–145)
Total Bilirubin: 0.7 mg/dL (ref 0.3–1.2)
Total Protein: 7.2 g/dL (ref 6.5–8.1)

## 2017-09-10 LAB — WET PREP, GENITAL
Clue Cells Wet Prep HPF POC: NONE SEEN
SPERM: NONE SEEN
TRICH WET PREP: NONE SEEN
YEAST WET PREP: NONE SEEN

## 2017-09-10 LAB — POCT PREGNANCY, URINE: Preg Test, Ur: NEGATIVE

## 2017-09-10 MED ORDER — IBUPROFEN 600 MG PO TABS: 600.0000 mg | ORAL_TABLET | Freq: Four times a day (QID) | ORAL | 0 refills | Status: DC | PRN

## 2017-09-10 MED ORDER — KETOROLAC TROMETHAMINE 60 MG/2ML IM SOLN
60.0000 mg | Freq: Once | INTRAMUSCULAR | Status: AC
  Administered 2017-09-10: 60 mg via INTRAMUSCULAR
  Filled 2017-09-10: qty 2

## 2017-09-10 NOTE — Discharge Instructions (Signed)
Get your medication at your pharmacy. Take ibuprofen around the clock with crackers (if no meal) for 2 days. Call the office in the morning and make an appointment to be seen.  In late 2019, the [facility] will be moving to the [place]. At that time, the [facility] will no longer serve non-pregnant patients. We encourage you to establish care with a provider before that time, so that you can be seen with any GYN concerns, like vaginal discharge, urinary tract infection, etc.. in a timely manner. In order to make the office visit more convenient, the [facility] [place] at [facility] will be offering evening hours from 4pm-8pm on Mondays starting 08/28/17. There will be same-day appointments, walk-in appointments and scheduled appointments available during this time.    [facility] [place] @ [facility] [phone]  For urgent needs, [facility] is also available for management of urgent GYN complaints such as vaginal discharge.   [employer] extends eligibility for family planning services to reduce unintended pregnancies and improve the well-being of children and families.   Eligible individuals whose income is at or below 195% of the federal poverty level and who are:  - [place] citizens, documented immigrants or qualified aliens;  - Residents of [place];  - Not incarcerated; and  - Not pregnant.   [employer] Family Planning Contact Information:  Medical Assistance Clinical Section Phone: [phone] Email: [email or web address]

## 2017-09-10 NOTE — MAU Provider Note (Signed)
History     CSN: [id number]  Arrival date and time: 09/10/17 1314   First Provider Initiated Contact with Patient 09/10/17 1415      Chief Complaint  Patient presents with  . Abdominal Pain    right side   HPI [name] 41 y.o. Comes to MAU with RLQ pain for 2 days.  Last pain medication was at 2 am today.   Her pain this afternoon is severe and she is having difficulty walking.  Had surgery one year ago where a fibroid was removed and she had adhesions removed and possibly had some endometriosis.  MD said her problem with pain was taken care of.  For 6 months, she had no pain.  Then had pain with menses.  Today's pain is worse and is not associated with her period.  Is tearful and upset.  Has not slept for 2 nights.   OB History    Gravida Para Term Preterm AB Living   3 2 0 1 1 2    SAB TAB Ectopic Multiple Live Births     1            Past Medical History:  Diagnosis Date  . Anemia   . Asthma   . Bilateral ovarian cysts   . Bipolar 1 disorder, depressed, mild ([place])   . GERD (gastroesophageal reflux disease)   . History of blood transfusion 1994   during delivery  . HTN (hypertension)   . Migraines   . Pregnancy induced hypertension   . Seizures ([place])    during labor just that one time    Past Surgical History:  Procedure Laterality Date  . CESAREAN SECTION    . INDUCED ABORTION    . LAPAROTOMY N/A 05/05/2016   Procedure: EXPLORATORY LAPAROTOMY WITH ULTRASOUND;  Surgeon: Waymon Amato, MD;  Location: [facility];  Service: Gynecology;  Laterality: N/A;  needs ultrasound ...Marland KitchenMarland KitchenMarland Kitchenlysis of adhesions   . MYOMECTOMY N/A 05/05/2016   Procedure: MYOMECTOMY, Biopsy Right Ovary & Peritoneum;  Surgeon: Waymon Amato, MD;  Location: [facility];  Service: Gynecology;  Laterality: N/A;  . TUBAL LIGATION      Family History  Problem Relation Age of Onset  . Asthma Mother   . Asthma Brother     Social History  Substance Use Topics  . Smoking status: Former Smoker    Packs/day: 0.25     Years: 20.00    Types: Cigarettes    Quit date: 04/09/2014  . Smokeless tobacco: Never Used  . Alcohol use Yes     Comment: twice a year    Allergies:  Allergies  Allergen Reactions  . Vicodin [Hydrocodone-Acetaminophen] Hives    Pt says she can take percocet  . Prednisone Swelling, Rash and Other (See Comments)    Pt states that this medication causes her legs to swell.  Pt also states that she does not have any problems with the IV form of this medication.      Prescriptions Prior to Admission  Medication Sig Dispense Refill Last Dose  . albuterol (PROVENTIL HFA;VENTOLIN HFA) 108 (90 Base) MCG/ACT inhaler Inhale 2 puffs into the lungs every 6 (six) hours as needed for wheezing or shortness of breath.   09/09/2017 at Unknown time  . albuterol (PROVENTIL) (5 MG/ML) 0.5% nebulizer solution Take 0.5 mLs (2.5 mg total) by nebulization every 4 (four) hours as needed for wheezing or shortness of breath. 20 mL 12 Past Month at Unknown time  . cetirizine (ZYRTEC) 10 MG tablet  Take 10 mg by mouth daily as needed for allergies.   Past Month at Unknown time  . hydrochlorothiazide (HYDRODIURIL) 25 MG tablet Take 25 mg by mouth daily.   09/09/2017 at Unknown time  . hydrocortisone cream 1 % Apply 1 application topically 2 (two) times daily as needed for itching.   09/09/2017 at Unknown time  . omeprazole (PRILOSEC) 20 MG capsule Take 20 mg by mouth 2 (two) times daily as needed (Heartburn).    Past Month at Unknown time  . Oxycodone HCl 10 MG TABS Take 10 mg by mouth 3 (three) times daily as needed (Pain).   Past Month at Unknown time    Review of Systems  Constitutional: Negative for fever.       Has difficulty walking due to the pain  Gastrointestinal: Positive for nausea. Negative for diarrhea and vomiting.       RLQ pain  Genitourinary: Negative for vaginal bleeding and vaginal discharge.   Physical Exam   Blood pressure 126/90, pulse 71, temperature 97.7 F (36.5 C), temperature source  Oral, resp. rate 16, weight 168 lb (76.2 kg), last menstrual period 08/25/2017.  Physical Exam  Nursing note and vitals reviewed. Constitutional: She is oriented to person, place, and time. She appears well-developed and well-nourished.  HENT:  Head: Normocephalic.  Eyes: EOM are normal.  Neck: Neck supple.  GI: Soft. Bowel sounds are normal. There is tenderness. There is no rebound and no guarding.  Very gentle palpation as client is very tearful  Genitourinary:  Genitourinary Comments: Speculum exam: Vagina - Small amount of white discharge, no odor Cervix - No contact bleeding Bimanual exam: Cervix closed and mildly tender with palpation Uterus tender with any palpation Adnexa non tender on left, very tender on right, no masses bilaterally Client is tearful with gentle bimanual exam GC/Chlam, wet prep done Chaperone present for exam.   Musculoskeletal: Normal range of motion.  Neurological: She is alert and oriented to person, place, and time.  Skin: Skin is warm and dry.  Psychiatric: She has a normal mood and affect.    MAU Course  Procedures Results for orders placed or performed during the hospital encounter of 09/10/17 (from the past 24 hour(s))  Urinalysis, Routine w reflex microscopic     Status: None   Collection Time: 09/10/17  1:27 PM  Result Value Ref Range   Color, Urine YELLOW YELLOW   APPearance CLEAR CLEAR   Specific Gravity, Urine 1.023 1.005 - 1.030   pH 6.0 5.0 - 8.0   Glucose, UA NEGATIVE NEGATIVE mg/dL   Hgb urine dipstick NEGATIVE NEGATIVE   Bilirubin Urine NEGATIVE NEGATIVE   Ketones, ur NEGATIVE NEGATIVE mg/dL   Protein, ur NEGATIVE NEGATIVE mg/dL   Nitrite NEGATIVE NEGATIVE   Leukocytes, UA NEGATIVE NEGATIVE  Pregnancy, urine POC     Status: None   Collection Time: 09/10/17  1:56 PM  Result Value Ref Range   Preg Test, Ur NEGATIVE NEGATIVE  Wet prep, genital     Status: Abnormal   Collection Time: 09/10/17  2:15 PM  Result Value Ref Range    Yeast Wet Prep HPF POC NONE SEEN NONE SEEN   Trich, Wet Prep NONE SEEN NONE SEEN   Clue Cells Wet Prep HPF POC NONE SEEN NONE SEEN   WBC, Wet Prep HPF POC FEW (A) NONE SEEN   Sperm NONE SEEN   CBC     Status: None   Collection Time: 09/10/17  2:22 PM  Result Value Ref  Range   WBC 7.6 4.0 - 10.5 K/uL   RBC 4.40 3.87 - 5.11 MIL/uL   Hemoglobin 13.8 12.0 - 15.0 g/dL   HCT 40.4 36.0 - 46.0 %   MCV 91.8 78.0 - 100.0 fL   MCH 31.4 26.0 - 34.0 pg   MCHC 34.2 30.0 - 36.0 g/dL   RDW 12.6 11.5 - 15.5 %   Platelets 378 150 - 400 K/uL  Comprehensive metabolic panel     Status: Abnormal   Collection Time: 09/10/17  2:22 PM  Result Value Ref Range   Sodium 136 135 - 145 mmol/L   Potassium 3.7 3.5 - 5.1 mmol/L   Chloride 104 101 - 111 mmol/L   CO2 26 22 - 32 mmol/L   Glucose, Bld 97 65 - 99 mg/dL   BUN 12 6 - 20 mg/dL   Creatinine, Ser 0.99 0.44 - 1.00 mg/dL   Calcium 8.8 (L) 8.9 - 10.3 mg/dL   Total Protein 7.2 6.5 - 8.1 g/dL   Albumin 4.1 3.5 - 5.0 g/dL   AST 18 15 - 41 U/L   ALT 13 (L) 14 - 54 U/L   Alkaline Phosphatase 46 38 - 126 U/L   Total Bilirubin 0.7 0.3 - 1.2 mg/dL   GFR calc non Af Amer >60 >60 mL/min   GFR calc Af Amer >60 >60 mL/min   Anion gap 6 5 - 15   CLINICAL DATA:  Right lower quadrant pain.  EXAM: TRANSABDOMINAL AND TRANSVAGINAL ULTRASOUND OF PELVIS  TECHNIQUE: Both transabdominal and transvaginal ultrasound examinations of the pelvis were performed. Transabdominal technique was performed for global imaging of the pelvis including uterus, ovaries, adnexal regions, and pelvic cul-de-sac. It was necessary to proceed with endovaginal exam following the transabdominal exam to visualize the endometrium and ovaries.  COMPARISON:  None  FINDINGS: Uterus  Measurements: 7.8 x 4.5 x 5.7 cm. There is a 12 mm fibroid in the posterior uterus.  Endometrium  Thickness: 5.9 mm.  No focal abnormality visualized.  Right ovary  Measurements: 3.7 x 2.2 x  3.07. Normal appearance/no adnexal mass.  Left ovary  Measurements: 4.0 x 2.1 x 2.2 cm. Contains a dominant follicle.  Other findings  No abnormal free fluid.  IMPRESSION: 1. There is a dominant follicle in the left ovary. 2. Small fibroid in the posterior uterus. 3. No acute abnormalities.   MDM Toradol IM for pain which did relieve her pain more than medication she was taking at home. 34  Consult with Dr. Charlesetta Garibaldi re: plan of care, will get ultrasound today as client is in so much pain.  Assessment and Plan  Acute RLQ abdominal pain, likely is an exacerbation from previous pelvic pain that was relieved for 6 months after surgery - unsure what exactly is causing her problem today - will need follow up for plan for chronic pelvic pain.  Plan Control pain - take ibuprofen 600 mg PO every 6 hours Follow up in the office for a plan of care - call in the morning to schedule an appointment. Return here if symptoms worsen.   Terri L Burleson 09/10/2017, 2:30 PM

## 2017-09-10 NOTE — MAU Note (Signed)
Pain right lower abdomen since 2:00 am today, has history of fibroids, had myomectomy about 1 year ago but was told there is lots of scar tissue, patient is tearful.

## 2017-09-11 LAB — GC/CHLAMYDIA PROBE AMP (~~LOC~~) NOT AT ARMC
Chlamydia: NEGATIVE
Neisseria Gonorrhea: NEGATIVE

## 2017-09-11 LAB — HIV ANTIBODY (ROUTINE TESTING W REFLEX): HIV Screen 4th Generation wRfx: NONREACTIVE

## 2017-09-11 LAB — RPR: RPR: NONREACTIVE

## 2017-12-15 ENCOUNTER — Emergency Department (HOSPITAL_COMMUNITY): Payer: Self-pay

## 2017-12-15 ENCOUNTER — Encounter (HOSPITAL_COMMUNITY): Payer: Self-pay | Admitting: Emergency Medicine

## 2017-12-15 ENCOUNTER — Emergency Department (HOSPITAL_COMMUNITY)
Admission: EM | Admit: 2017-12-15 | Discharge: 2017-12-15 | Disposition: A | Discharge status: Home / Self Care | Payer: Self-pay | Attending: Emergency Medicine | Admitting: Emergency Medicine

## 2017-12-15 DIAGNOSIS — I10 Essential (primary) hypertension: Secondary | ICD-10-CM | POA: Insufficient documentation

## 2017-12-15 DIAGNOSIS — Z79899 Other long term (current) drug therapy: Secondary | ICD-10-CM | POA: Insufficient documentation

## 2017-12-15 DIAGNOSIS — R1011 Right upper quadrant pain: Secondary | ICD-10-CM

## 2017-12-15 DIAGNOSIS — R102 Pelvic and perineal pain: Secondary | ICD-10-CM | POA: Insufficient documentation

## 2017-12-15 DIAGNOSIS — Z87891 Personal history of nicotine dependence: Secondary | ICD-10-CM | POA: Insufficient documentation

## 2017-12-15 DIAGNOSIS — J45909 Unspecified asthma, uncomplicated: Secondary | ICD-10-CM | POA: Insufficient documentation

## 2017-12-15 LAB — CBC WITH DIFFERENTIAL/PLATELET
Basophils Absolute: 0 10*3/uL (ref 0.0–0.1)
Basophils Relative: 1 %
Eosinophils Absolute: 0.4 10*3/uL (ref 0.0–0.7)
Eosinophils Relative: 5 %
HCT: 40.6 % (ref 36.0–46.0)
Hemoglobin: 13.8 g/dL (ref 12.0–15.0)
Lymphocytes Relative: 53 %
Lymphs Abs: 4.3 10*3/uL — ABNORMAL HIGH (ref 0.7–4.0)
MCH: 31.6 pg (ref 26.0–34.0)
MCHC: 34 g/dL (ref 30.0–36.0)
MCV: 92.9 fL (ref 78.0–100.0)
Monocytes Absolute: 0.5 10*3/uL (ref 0.1–1.0)
Monocytes Relative: 6 %
Neutro Abs: 2.9 10*3/uL (ref 1.7–7.7)
Neutrophils Relative %: 35 %
Platelets: 395 10*3/uL (ref 150–400)
RBC: 4.37 MIL/uL (ref 3.87–5.11)
RDW: 12.5 % (ref 11.5–15.5)
WBC: 8.1 10*3/uL (ref 4.0–10.5)

## 2017-12-15 LAB — URINALYSIS, ROUTINE W REFLEX MICROSCOPIC
Bilirubin Urine: NEGATIVE
Glucose, UA: NEGATIVE mg/dL
Hgb urine dipstick: NEGATIVE
Ketones, ur: 5 mg/dL — AB
Leukocytes, UA: NEGATIVE
Nitrite: NEGATIVE
Protein, ur: NEGATIVE mg/dL
Specific Gravity, Urine: 1.03 (ref 1.005–1.030)
pH: 6 (ref 5.0–8.0)

## 2017-12-15 LAB — COMPREHENSIVE METABOLIC PANEL
ALT: 12 U/L — ABNORMAL LOW (ref 14–54)
AST: 17 U/L (ref 15–41)
Albumin: 4 g/dL (ref 3.5–5.0)
Alkaline Phosphatase: 56 U/L (ref 38–126)
Anion gap: 8 (ref 5–15)
BUN: 13 mg/dL (ref 6–20)
CO2: 27 mmol/L (ref 22–32)
Calcium: 9.2 mg/dL (ref 8.9–10.3)
Chloride: 103 mmol/L (ref 101–111)
Creatinine, Ser: 0.82 mg/dL (ref 0.44–1.00)
GFR calc Af Amer: >60 mL/min (ref >60)
GFR calc non Af Amer: >60 mL/min (ref >60)
Glucose, Bld: 90 mg/dL (ref 65–99)
Potassium: 3.5 mmol/L (ref 3.5–5.1)
Sodium: 138 mmol/L (ref 135–145)
Total Bilirubin: 0.7 mg/dL (ref 0.3–1.2)
Total Protein: 7.4 g/dL (ref 6.5–8.1)

## 2017-12-15 LAB — LIPASE, BLOOD: Lipase: 28 U/L (ref 11–51)

## 2017-12-15 LAB — I-STAT BETA HCG BLOOD, ED (MC, WL, AP ONLY): I-stat hCG, quantitative: <5 m[IU]/mL (ref <5)

## 2017-12-15 MED ORDER — ONDANSETRON HCL 4 MG PO TABS: 4.0000 mg | ORAL_TABLET | Freq: Three times a day (TID) | ORAL | 0 refills | Status: DC | PRN

## 2017-12-15 MED ORDER — HYDROMORPHONE HCL 1 MG/ML IJ SOLN
1.0000 mg | Freq: Once | INTRAMUSCULAR | Status: AC
  Administered 2017-12-15: 1 mg via INTRAVENOUS
  Filled 2017-12-15: qty 1

## 2017-12-15 MED ORDER — OMEPRAZOLE 20 MG PO CPDR: 20.0000 mg | DELAYED_RELEASE_CAPSULE | Freq: Two times a day (BID) | ORAL | 0 refills | Status: DC

## 2017-12-15 MED ORDER — SUCRALFATE 1 G PO TABS: 1.0000 g | ORAL_TABLET | Freq: Three times a day (TID) | ORAL | 0 refills | Status: DC

## 2017-12-15 MED ORDER — SODIUM CHLORIDE 0.9 % IV BOLUS (SEPSIS)
1000.0000 mL | Freq: Once | INTRAVENOUS | Status: AC
  Administered 2017-12-15: 1000 mL via INTRAVENOUS

## 2017-12-15 MED ORDER — ONDANSETRON HCL 4 MG/2ML IJ SOLN
4.0000 mg | Freq: Once | INTRAMUSCULAR | Status: AC
  Administered 2017-12-15: 4 mg via INTRAVENOUS
  Filled 2017-12-15: qty 2

## 2017-12-15 NOTE — ED Notes (Signed)
US at bedside

## 2017-12-15 NOTE — ED Notes (Signed)
Bed: WC13 Expected date:  Expected time:  Means of arrival:  Comments: mopping

## 2017-12-15 NOTE — ED Notes (Signed)
Pt resting and verbalizes will alert staff if pain rises.

## 2017-12-15 NOTE — Discharge Instructions (Signed)
Take Zofran for nausea.  Take Carafate with meals and at night.  Take Prilosec twice daily before meals.  Eat a diet of bland foods that will not upset your stomach.  Follow-up with your primary care physician or GI physician for reevaluation of symptoms.  Return to the ED immediately for any concerning signs or symptoms develop such as blood in your urine or stool, fevers, or if you are unable to keep down any food or drink for several days.

## 2017-12-15 NOTE — ED Provider Notes (Signed)
[facility] DEPT Provider Note   CSN: [id number] Arrival date & time: 12/15/17  0957     History   Chief Complaint Chief Complaint  Patient presents with  . Abdominal Pain    RUQ    HPI [name] is a 41 y.o. female with history of GERD, bipolar 1 disorder, ovarian cysts, hypertension, uterine fibroids, and chronic pelvic pain presents today with chief complaint acute onset, progressively worsening right upper quadrant abdominal pain for 3 days.  States pain is sharp and stabbing, worsens with movement and palpation as well as cough and deep breaths.  She also endorses excessive belching and "gas feeling "as well as abdominal bloating during this time.  She states pain worsened last night.  She endorses nausea but no vomiting.  Denies fevers or chills.  Endorses some constipation but states she had a bowel movement yesterday.  Denies melena, hematochezia, hematuria, or dysuria.  Denies any recent trauma, or bending/lifting/twisting injuries.  Endorses urinary urgency and frequency.  Has not tried anything for her symptoms.  She denies shortness of breath, chest pain, recent travel or surgeries, prior history of DVT or PE, hemoptysis, or OCP/estrogen use.  Smoker and quit smoking 1 year ago.  The history is provided by the patient.    Past Medical History:  Diagnosis Date  . Anemia   . Asthma   . Bilateral ovarian cysts   . Bipolar 1 disorder, depressed, mild ([place])   . GERD (gastroesophageal reflux disease)   . History of blood transfusion 1994   during delivery  . HTN (hypertension)   . Migraines   . Pregnancy induced hypertension   . Seizures ([place])    during labor just that one time    Patient Active Problem List   Diagnosis Date Noted  . Fibroids 05/07/2016  . Chronic pelvic pain in female 05/07/2016  . Fibroid, uterine 05/05/2016  . Acute bronchitis   . Hypokalemia   . HTN (hypertension) 09/02/2013  . Asthma exacerbation  09/01/2013  . Tobacco abuse 09/01/2013    Past Surgical History:  Procedure Laterality Date  . CESAREAN SECTION    . INDUCED ABORTION    . LAPAROTOMY N/A 05/05/2016   Procedure: EXPLORATORY LAPAROTOMY WITH ULTRASOUND;  Surgeon: Waymon Amato, MD;  Location: [facility];  Service: Gynecology;  Laterality: N/A;  needs ultrasound ...Marland KitchenMarland KitchenMarland Kitchenlysis of adhesions   . MYOMECTOMY N/A 05/05/2016   Procedure: MYOMECTOMY, Biopsy Right Ovary & Peritoneum;  Surgeon: Waymon Amato, MD;  Location: [facility];  Service: Gynecology;  Laterality: N/A;  . TUBAL LIGATION      OB History    Gravida Para Term Preterm AB Living   3 2 0 1 1 2    SAB TAB Ectopic Multiple Live Births     1             Home Medications    Prior to Admission medications   Medication Sig Start Date End Date Taking? Authorizing Provider  albuterol (PROVENTIL HFA;VENTOLIN HFA) 108 (90 Base) MCG/ACT inhaler Inhale 2 puffs into the lungs every 6 (six) hours as needed for wheezing or shortness of breath.   Yes [provider]  albuterol (PROVENTIL) (5 MG/ML) 0.5% nebulizer solution Take 0.5 mLs (2.5 mg total) by nebulization every 4 (four) hours as needed for wheezing or shortness of breath. 06/16/16  Yes Forde Dandy, MD  hydrochlorothiazide (HYDRODIURIL) 25 MG tablet Take 25 mg by mouth daily.   Yes [provider]  ibuprofen (ADVIL,MOTRIN)  200 MG tablet Take 400 mg by mouth daily as needed.   Yes [provider]  Oxycodone HCl 10 MG TABS Take 10 mg by mouth 3 (three) times daily as needed (Pain).   Yes [provider]  ibuprofen (ADVIL,MOTRIN) 600 MG tablet Take 1 tablet (600 mg total) by mouth every 6 (six) hours as needed. Patient not taking: Reported on 12/15/2017 09/10/17   Virginia Rochester, NP  omeprazole (PRILOSEC) 20 MG capsule Take 1 capsule (20 mg total) by mouth 2 (two) times daily before a meal for 14 days. 12/15/17 12/29/17  Rodell Perna A, PA-C  ondansetron (ZOFRAN) 4 MG tablet Take 1 tablet (4 mg total) by  mouth every 8 (eight) hours as needed for nausea or vomiting. 12/15/17   Nils Flack, Bertina Guthridge A, PA-C  sucralfate (CARAFATE) 1 g tablet Take 1 tablet (1 g total) by mouth 4 (four) times daily -  with meals and at bedtime for 7 days. 12/15/17 12/22/17  Renita Papa, PA-C    Family History Family History  Problem Relation Age of Onset  . Asthma Mother   . Asthma Brother     Social History Social History   Tobacco Use  . Smoking status: Former Smoker    Packs/day: 0.25    Years: 20.00    Pack years: 5.00    Types: Cigarettes    Last attempt to quit: 04/09/2014    Years since quitting: 3.6  . Smokeless tobacco: Never Used  Substance Use Topics  . Alcohol use: Yes    Comment: twice a year  . Drug use: Yes    Types: Marijuana    Comment: recreational use edible last week 04/18/16     Allergies   Vicodin [hydrocodone-acetaminophen] and Prednisone   Review of Systems Review of Systems  Constitutional: Negative for chills and fever.  Respiratory: Negative for shortness of breath.   Cardiovascular: Negative for chest pain.  Gastrointestinal: Positive for abdominal pain, constipation and nausea. Negative for blood in stool, diarrhea, rectal pain and vomiting.  Genitourinary: Positive for frequency and urgency. Negative for dysuria and hematuria.  All other systems reviewed and are negative.    Physical Exam Updated Vital Signs BP 115/74 (BP Location: Right Arm)   Pulse 68   Temp 98 F (36.7 C) (Oral)   Resp 16   LMP 12/06/2017 (Exact Date)   SpO2 97%   Physical Exam  Constitutional: She appears well-developed and well-nourished. No distress.  HENT:  Head: Normocephalic and atraumatic.  Eyes: Conjunctivae are normal. Right eye exhibits no discharge. Left eye exhibits no discharge.  Neck: No JVD present. No tracheal deviation present.  Cardiovascular: Normal rate, regular rhythm, normal heart sounds and intact distal pulses.  Pulmonary/Chest: Effort normal.  Scattered mild  expiratory wheezes, equal rise and fall of chest, no increased work of breathing, SPO2 100% on room air.  Abdominal: Soft. She exhibits no distension. Bowel sounds are increased. There is no hepatosplenomegaly. There is tenderness in the right upper quadrant. There is guarding. There is no rigidity, no rebound, no CVA tenderness, no tenderness at McBurney's point and negative Murphy's sign.  Musculoskeletal: She exhibits no edema.  Neurological: She is alert.  Skin: Skin is warm and dry. No erythema.  Psychiatric: She has a normal mood and affect. Her behavior is normal.  Nursing note and vitals reviewed.    ED Treatments / Results  Labs (all labs ordered are listed, but only abnormal results are displayed) Labs Reviewed  CBC WITH  DIFFERENTIAL/PLATELET - Abnormal; Notable for the following components:      Result Value   Lymphs Abs 4.3 (*)    All other components within normal limits  COMPREHENSIVE METABOLIC PANEL - Abnormal; Notable for the following components:   ALT 12 (*)    All other components within normal limits  URINALYSIS, ROUTINE W REFLEX MICROSCOPIC - Abnormal; Notable for the following components:   APPearance HAZY (*)    Ketones, ur 5 (*)    All other components within normal limits  LIPASE, BLOOD  I-STAT BETA HCG BLOOD, ED ([facility], [facility], [facility] ONLY)  I-STAT BETA HCG BLOOD, ED ([facility], [facility], [facility] ONLY)    EKG  EKG Interpretation None       Radiology Dg Chest 2 View  Result Date: 12/15/2017 CLINICAL DATA:  Right upper quadrant pain, cough. EXAM: CHEST  2 VIEW COMPARISON:  06/15/2016. FINDINGS: Trachea is midline. Heart size normal. Lungs are mildly hyperinflated but clear. No pleural fluid. Visualized upper abdomen is unremarkable. IMPRESSION: Mild hyperinflation without acute finding. Electronically Signed   By: Lorin Picket M.D.   On: 12/15/2017 15:44   US Abdomen Limited  Result Date: 12/15/2017 CLINICAL DATA:  Right upper quadrant pain. EXAM: ULTRASOUND ABDOMEN  LIMITED RIGHT UPPER QUADRANT COMPARISON:  09/10/2017. FINDINGS: Gallbladder: No gallstones or wall thickening visualized. No sonographic Murphy sign noted by sonographer. Common bile duct: Diameter: 3.6 mm Liver: Normal echogenicity. 8 mm echogenic nodular density noted in the right hepatic lobe. This is statistically most likely a benign hemangioma. Portal vein is patent on color Doppler imaging with normal direction of blood flow towards the liver. IMPRESSION: 1. No gallstones or biliary distention. 2. 8 mm well-circumscribed echogenic nodular density in right hepatic lobe. Statistically this is most likely a benign hemangioma . Electronically Signed   By: Marcello Moores  Register   On: 12/15/2017 14:48    Procedures Procedures (including critical care time)  Medications Ordered in ED Medications  sodium chloride 0.9 % bolus 1,000 mL (0 mLs Intravenous Stopped 12/15/17 1443)  ondansetron (ZOFRAN) injection 4 mg (4 mg Intravenous Given 12/15/17 1413)  HYDROmorphone (DILAUDID) injection 1 mg (1 mg Intravenous Given 12/15/17 1413)     Initial Impression / Assessment and Plan / ED Course  I have reviewed the triage vital signs and the nursing notes.  Pertinent labs & imaging results that were available during my care of the patient were reviewed by me and considered in my medical decision making (see chart for details).     Patient with right upper quadrant pain for 3 days with associated abdominal bloating.  Afebrile, vital signs are stable.  She is nontoxic in appearance.  No leukocytosis, no electrolyte abnormalities, and LFTs are within normal limits.  Symptoms improved after administration of fluids, nausea medicine and pain medicine.  Right upper quadrant ultrasound shows no evidence of cholecystitis or cholelithiasis.  It does show an 51mm well-circumscribed echogenic nodular density in the right hepatic lobe which is thought to be a benign hemangioma.  Chest x-ray shows no evidence of pulmonary  edema or pneumonia but does show mild hyperinflation consistent with her smoking history.  I doubt obstruction, perforation, appendicitis, colitis, or other acute surgical abdominal pathology.  I doubt PID, ovarian torsion, TOA, or ectopic pregnancy in the absence of lower abdominal pain.  Patient seen and evaluated by Dr. Winfred Leeds, and patient admitted to him this feels similar to her reflux pain although somewhat more severe.  She also states that she takes ibuprofen on  occasion for her headaches.  No further emergent workup required at this time.  Will discharge with Zofran, Carafate, and Prilosec for GERD.  She will follow-up with her primary care physician or a GI specialist for reevaluation of her symptoms.  Discussed indications for return to the ED. Pt verbalized understanding of and agreement with plan and is safe for discharge home at this time.   Final Clinical Impressions(s) / ED Diagnoses   Final diagnoses:  Right upper quadrant abdominal pain    ED Discharge Orders        Ordered    sucralfate (CARAFATE) 1 g tablet  3 times daily with meals & bedtime     12/15/17 1602    omeprazole (PRILOSEC) 20 MG capsule  2 times daily before meals     12/15/17 1602    ondansetron (ZOFRAN) 4 MG tablet  Every 8 hours PRN     12/15/17 1602       Renita Papa, PA-C 12/15/17 1636    Orlie Dakin, MD 12/15/17 [phone]

## 2017-12-15 NOTE — ED Triage Notes (Signed)
Pt c/o RUQ pain, patient states she has had a lot of belching and gas, difficulty lying on the R side, c/o abdominal distention, reports normal bowel movements, denies urine symptoms.

## 2018-10-03 ENCOUNTER — Ambulatory Visit: Payer: No Typology Code available for payment source | Admitting: Family Medicine

## 2018-11-20 ENCOUNTER — Emergency Department (HOSPITAL_COMMUNITY): Payer: No Typology Code available for payment source

## 2018-11-20 ENCOUNTER — Other Ambulatory Visit: Payer: Self-pay

## 2018-11-20 ENCOUNTER — Emergency Department (HOSPITAL_COMMUNITY)
Admission: EM | Admit: 2018-11-20 | Discharge: 2018-11-21 | Disposition: A | Discharge status: Home / Self Care | Payer: No Typology Code available for payment source | Attending: Emergency Medicine | Admitting: Emergency Medicine

## 2018-11-20 ENCOUNTER — Encounter (HOSPITAL_COMMUNITY): Payer: Self-pay | Admitting: Emergency Medicine

## 2018-11-20 DIAGNOSIS — Z87891 Personal history of nicotine dependence: Secondary | ICD-10-CM | POA: Insufficient documentation

## 2018-11-20 DIAGNOSIS — Z209 Contact with and (suspected) exposure to unspecified communicable disease: Secondary | ICD-10-CM | POA: Insufficient documentation

## 2018-11-20 DIAGNOSIS — I10 Essential (primary) hypertension: Secondary | ICD-10-CM | POA: Insufficient documentation

## 2018-11-20 DIAGNOSIS — J4541 Moderate persistent asthma with (acute) exacerbation: Secondary | ICD-10-CM | POA: Insufficient documentation

## 2018-11-20 DIAGNOSIS — Z79899 Other long term (current) drug therapy: Secondary | ICD-10-CM | POA: Insufficient documentation

## 2018-11-20 MED ORDER — ALBUTEROL SULFATE (2.5 MG/3ML) 0.083% IN NEBU
5.0000 mg | INHALATION_SOLUTION | Freq: Once | RESPIRATORY_TRACT | Status: AC
  Administered 2018-11-21: 5 mg via RESPIRATORY_TRACT
  Filled 2018-11-20: qty 6

## 2018-11-20 NOTE — ED Triage Notes (Signed)
Patient c/o cough, SOB and wheezing today. Audible wheezing in triage. Speaking in full sentences.

## 2018-11-21 MED ORDER — PREDNISONE 20 MG PO TABS: 60.0000 mg | ORAL_TABLET | Freq: Every day | ORAL | 0 refills | Status: DC

## 2018-11-21 MED ORDER — IPRATROPIUM-ALBUTEROL 0.5-2.5 (3) MG/3ML IN SOLN
3.0000 mL | Freq: Once | RESPIRATORY_TRACT | Status: AC
  Administered 2018-11-21: 3 mL via RESPIRATORY_TRACT
  Filled 2018-11-21: qty 3

## 2018-11-21 MED ORDER — PREDNISONE 20 MG PO TABS
60.0000 mg | ORAL_TABLET | Freq: Once | ORAL | Status: AC
  Administered 2018-11-21: 60 mg via ORAL
  Filled 2018-11-21: qty 3

## 2018-11-21 NOTE — Discharge Instructions (Addendum)
Use your nebulizer and inhaler as needed.  Return if symptoms are not being adequately controlled at home.

## 2018-11-21 NOTE — ED Provider Notes (Signed)
[facility] DEPT Provider Note   CSN: [id number] Arrival date & time: 11/20/18  2215     History   Chief Complaint Chief Complaint  Patient presents with  . Shortness of Breath  . Cough    HPI [name] is a 42 y.o. female.   The history is provided by the patient.   Shortness of Breath   Associated symptoms include cough.   Cough   Associated symptoms include shortness of breath.  She has history of hypertension, asthma and comes in complaining of cough and shortness of breath and wheezing today.  Cough is productive of a small amount of yellow sputum.  She denies fever, chills, sweats.  There is soreness in her chest, but no true chest pain and no body aches.  She has used her home nebulizer and inhaler without relief.  She did have a sick contact over the weekend.  She has not had the influenza immunization.  She has been hospitalized for asthma in the past, but has never been intubated.  Past Medical History:  Diagnosis Date  . Anemia   . Asthma   . Bilateral ovarian cysts   . Bipolar 1 disorder, depressed, mild ([place])   . GERD (gastroesophageal reflux disease)   . History of blood transfusion 1994   during delivery  . HTN (hypertension)   . Migraines   . Pregnancy induced hypertension   . Seizures ([place])    during labor just that one time    Patient Active Problem List   Diagnosis Date Noted  . Fibroids 05/07/2016  . Chronic pelvic pain in female 05/07/2016  . Fibroid, uterine 05/05/2016  . Acute bronchitis   . Hypokalemia   . HTN (hypertension) 09/02/2013  . Asthma exacerbation 09/01/2013  . Tobacco abuse 09/01/2013    Past Surgical History:  Procedure Laterality Date  . CESAREAN SECTION    . INDUCED ABORTION    . LAPAROTOMY N/A 05/05/2016   Procedure: EXPLORATORY LAPAROTOMY WITH ULTRASOUND;  Surgeon: Waymon Amato, MD;  Location: [facility];  Service: Gynecology;  Laterality: N/A;  needs ultrasound ...Marland KitchenMarland KitchenMarland Kitchenlysis of adhesions     . MYOMECTOMY N/A 05/05/2016   Procedure: MYOMECTOMY, Biopsy Right Ovary & Peritoneum;  Surgeon: Waymon Amato, MD;  Location: [facility];  Service: Gynecology;  Laterality: N/A;  . TUBAL LIGATION       OB History    Gravida  3   Para  2   Term  0   Preterm  1   AB  1   Living  2     SAB      TAB  1   Ectopic      Multiple      Live Births               Home Medications    Prior to Admission medications   Medication Sig Start Date End Date Taking? Authorizing Provider  albuterol (PROVENTIL HFA;VENTOLIN HFA) 108 (90 Base) MCG/ACT inhaler Inhale 2 puffs into the lungs every 6 (six) hours as needed for wheezing or shortness of breath.    [provider]  albuterol (PROVENTIL) (5 MG/ML) 0.5% nebulizer solution Take 0.5 mLs (2.5 mg total) by nebulization every 4 (four) hours as needed for wheezing or shortness of breath. 06/16/16   Forde Dandy, MD  hydrochlorothiazide (HYDRODIURIL) 25 MG tablet Take 25 mg by mouth daily.    [provider]  ibuprofen (ADVIL,MOTRIN) 200 MG tablet Take 400 mg by  mouth daily as needed.    [provider]  ibuprofen (ADVIL,MOTRIN) 600 MG tablet Take 1 tablet (600 mg total) by mouth every 6 (six) hours as needed. Patient not taking: Reported on 12/15/2017 09/10/17   Virginia Rochester, NP  omeprazole (PRILOSEC) 20 MG capsule Take 1 capsule (20 mg total) by mouth 2 (two) times daily before a meal for 14 days. 12/15/17 12/29/17  Rodell Perna A, PA-C  ondansetron (ZOFRAN) 4 MG tablet Take 1 tablet (4 mg total) by mouth every 8 (eight) hours as needed for nausea or vomiting. 12/15/17   Nils Flack, Mina A, PA-C  Oxycodone HCl 10 MG TABS Take 10 mg by mouth 3 (three) times daily as needed (Pain).    [provider]  sucralfate (CARAFATE) 1 g tablet Take 1 tablet (1 g total) by mouth 4 (four) times daily -  with meals and at bedtime for 7 days. 12/15/17 12/22/17  Renita Papa, PA-C    Family History Family History  Problem  Relation Age of Onset  . Asthma Mother   . Asthma Brother     Social History Social History   Tobacco Use  . Smoking status: Former Smoker    Packs/day: 0.25    Years: 20.00    Pack years: 5.00    Types: Cigarettes    Last attempt to quit: 04/09/2014    Years since quitting: 4.6  . Smokeless tobacco: Never Used  Substance Use Topics  . Alcohol use: Yes    Comment: twice a year  . Drug use: Yes    Types: Marijuana    Comment: recreational use edible last week 04/18/16     Allergies   Vicodin [hydrocodone-acetaminophen] and Prednisone   Review of Systems Review of Systems  Respiratory: Positive for cough and shortness of breath.   All other systems reviewed and are negative.    Physical Exam Updated Vital Signs BP 132/89   Pulse (!) 110   Temp 98.1 F (36.7 C) (Oral)   Resp (!) 21   Ht 5' (1.524 m)   Wt 78 kg   LMP 10/31/2018   SpO2 95%   BMI 33.59 kg/m    Physical Exam  Nursing note and vitals reviewed.  42 year old female, appears mildly dyspneic, but is in no acute distress. Vital signs are significant for rapid heart rate and respiratory rate. Oxygen saturation is 95%, which is normal. Head is normocephalic and atraumatic. PERRLA, EOMI. Oropharynx is clear. Neck is nontender and supple without adenopathy or JVD. Back is nontender and there is no CVA tenderness. Lungs have diffuse inspiratory and expiratory wheezes without rales or rhonchi. Chest is nontender. Heart has regular rate and rhythm without murmur. Abdomen is soft, flat, nontender without masses or hepatosplenomegaly and peristalsis is normoactive. Extremities have no cyanosis or edema, full range of motion is present. Skin is warm and dry without rash. Neurologic: Mental status is normal, cranial nerves are intact, there are no motor or sensory deficits.  ED Treatments / Results   Radiology Dg Chest 2 View  Result Date: 11/20/2018 CLINICAL DATA:  42 year old female with shortness of  breath. EXAM: CHEST - 2 VIEW COMPARISON:  Chest radiograph dated 12/15/2017 FINDINGS: The heart size and mediastinal contours are within normal limits. Both lungs are clear. The visualized skeletal structures are unremarkable. IMPRESSION: No active cardiopulmonary disease. Electronically Signed   By: Anner Crete M.D.   On: 11/20/2018 23:26    Procedures Procedures  Medications Ordered in ED  Medications  albuterol (PROVENTIL) (2.5 MG/3ML) 0.083% nebulizer solution 5 mg (5 mg Nebulization Given 11/21/18 0046)  ipratropium-albuterol (DUONEB) 0.5-2.5 (3) MG/3ML nebulizer solution 3 mL (3 mLs Nebulization Given 11/21/18 0050)  predniSONE (DELTASONE) tablet 60 mg (60 mg Oral Given 11/21/18 0055)  ipratropium-albuterol (DUONEB) 0.5-2.5 (3) MG/3ML nebulizer solution 3 mL (3 mLs Nebulization Given 11/21/18 0139)     Initial Impression / Assessment and Plan / ED Course  I have reviewed the triage vital signs and the nursing notes.  Pertinent imaging results that were available during my care of the patient were reviewed by me and considered in my medical decision making (see chart for details).  Again acute exacerbation of asthma.  Chest x-ray shows no evidence of pneumonia.  She had received an albuterol nebulizer treatment prior to my seeing her without any benefit.  She is given a dose of prednisone and will be given nebulizer treatment with albuterol and ipratropium.  Old records are reviewed confirming past ED visits and hospitalizations for asthma.  Last hospitalization was in 2016, last ED visit was in 2017.  Of note, she reports history of allergy to prednisone, but this only occurred with one episode of taking prednisone and she has taken it several times since then without recurrence of the allergic reaction.  Following second nebulizer treatment, patient is feeling much better.  On exam, there is minimal wheezing.  There is good airflow.  She is given a third nebulizer treatment, but  anticipate she will be able to be discharged following that.  Following third nebulizer treatment, lungs are completely clear.  She is discharged with prescription for prednisone.  She has home inhaler and nebulizer and is uses as needed.  Return precautions discussed.  Final Clinical Impressions(s) / ED Diagnoses   Final diagnoses:  Moderate persistent asthma with exacerbation    ED Discharge Orders         Ordered    predniSONE (DELTASONE) 20 MG tablet  Daily     50/35/46 5681          Delora Fuel, MD 27/51/70 [phone]

## 2019-03-06 ENCOUNTER — Telehealth: Payer: Self-pay | Admitting: *Deleted

## 2019-03-06 NOTE — Telephone Encounter (Signed)
Called pt to notify of change in check in process. Pt denies any s/s of covid-19, travel or exposure to covid-19.  Pt verbalized understanding and stated she will attend her appointment.

## 2019-03-07 ENCOUNTER — Ambulatory Visit (INDEPENDENT_AMBULATORY_CARE_PROVIDER_SITE_OTHER): Payer: Medicaid Other | Admitting: Obstetrics & Gynecology

## 2019-03-07 ENCOUNTER — Other Ambulatory Visit (HOSPITAL_COMMUNITY)
Admission: RE | Admit: 2019-03-07 | Discharge: 2019-03-07 | Disposition: A | Discharge status: Home / Self Care | Payer: Medicaid Other | Source: Ambulatory Visit | Attending: Obstetrics & Gynecology | Admitting: Obstetrics & Gynecology

## 2019-03-07 ENCOUNTER — Other Ambulatory Visit: Payer: Self-pay

## 2019-03-07 ENCOUNTER — Encounter: Payer: Self-pay | Admitting: Obstetrics & Gynecology

## 2019-03-07 VITALS — BP 142/94 | HR 78 | Wt 172.6 lb

## 2019-03-07 DIAGNOSIS — Z01419 Encounter for gynecological examination (general) (routine) without abnormal findings: Secondary | ICD-10-CM | POA: Diagnosis present

## 2019-03-07 DIAGNOSIS — Z0000 Encounter for general adult medical examination without abnormal findings: Secondary | ICD-10-CM

## 2019-03-07 DIAGNOSIS — Z23 Encounter for immunization: Secondary | ICD-10-CM

## 2019-03-07 DIAGNOSIS — N946 Dysmenorrhea, unspecified: Secondary | ICD-10-CM

## 2019-03-07 MED ORDER — ELAGOLIX SODIUM 150 MG PO TABS: 1.0000 | ORAL_TABLET | Freq: Every day | ORAL | 6 refills | Status: DC

## 2019-03-07 NOTE — Progress Notes (Signed)
Subjective:    [name] is a 43 y.o. single P2 (29 and 67 yo kids, 1 granddaughter)  female who presents for an annual exam. She has terribly painful periods, monthly lasting 7-10 days. She takes IBU, tylenol pm, heating pads. She has tried depo provera, OCPs, no help with any of these meds. She had a myomectomy and was told that she has endometriosis, done by [facility]. The patient is sexually active. GYN screening history: last pap: was normal. The patient wears seatbelts: yes. The patient participates in regular exercise: yes. Has the patient ever been transfused or tattooed?: yes. The patient reports that there is not domestic violence in her life.   Menstrual History: OB History    Gravida  3   Para  2   Term  1   Preterm  1   AB  1   Living  2     SAB  0   TAB  1   Ectopic  0   Multiple  0   Live Births  2           Menarche age: 50 Patient's last menstrual period was 02/05/2019 (approximate).    The following portions of the patient's history were reviewed and updated as appropriate: allergies, current medications, past family history, past medical history, past social history, past surgical history and problem list.  Review of Systems Pertinent items are noted in HPI.   FH- + breast cancer in her 28 yo niece, no gyn cancer, + colon cancer in maternal uncle Currently unemployed Declines flu vaccine She had a BTL in the past. Sex is sometimes painful Monogamous for 11 years, lives together    Objective:    BP (!) 142/94   Pulse 78   Wt 172 lb 9.6 oz (78.3 kg)   LMP 02/05/2019 (Approximate)   BMI 33.71 kg/m   General Appearance:    Alert, cooperative, no distress, appears stated age  Head:    Normocephalic, without obvious abnormality, atraumatic  Eyes:    PERRL, conjunctiva/corneas clear, EOM's intact, fundi    benign, both eyes  Ears:    Normal TM's and external ear canals, both ears  Nose:   Nares normal, septum midline, mucosa  normal, no drainage    or sinus tenderness  Throat:   Lips, mucosa, and tongue normal; teeth and gums normal  Neck:   Supple, symmetrical, trachea midline, no adenopathy;    thyroid:  no enlargement/tenderness/nodules; no carotid   bruit or JVD  Back:     Symmetric, no curvature, ROM normal, no CVA tenderness  Lungs:     Clear to auscultation bilaterally, respirations unlabored  Chest Wall:    No tenderness or deformity   Heart:    Regular rate and rhythm, S1 and S2 normal, no murmur, rub   or gallop  Breast Exam:    No tenderness, masses, or nipple abnormality  Abdomen:     Soft, non-tender, bowel sounds active all four quadrants,    no masses, no organomegaly  Genitalia:    Normal female without lesion, discharge or tenderness, 6 week size and shape, anteverted, mobile, non-tender, normal adnexal exam      Extremities:   Extremities normal, atraumatic, no cyanosis or edema  Pulses:   2+ and symmetric all extremities  Skin:   Skin color, texture, turgor normal, no rashes or lesions  Lymph nodes:   Cervical, supraclavicular, and axillary nodes normal  Neurologic:   CNII-XII intact, normal  strength, sensation and reflexes    throughout  .    Assessment:    Healthy female exam.   dysmenorrhea   Plan:     Thin prep Pap smear. with cotesting TDAP today She wanted to try depo Lupron, but it was not covered by [facility] I prescribed Orilissa, but I told her that it may not be covered either I will re check a gyn u/s I also offered TAH/BS/possible BSO. Come back 1 week

## 2019-03-11 LAB — CYTOLOGY - PAP
DIAGNOSIS: NEGATIVE
HPV: NOT DETECTED

## 2019-04-04 ENCOUNTER — Ambulatory Visit (HOSPITAL_COMMUNITY): Payer: Self-pay

## 2019-04-09 ENCOUNTER — Ambulatory Visit (HOSPITAL_COMMUNITY): Payer: Self-pay

## 2019-04-18 ENCOUNTER — Ambulatory Visit: Payer: No Typology Code available for payment source | Admitting: Obstetrics & Gynecology

## 2020-03-06 ENCOUNTER — Ambulatory Visit: Payer: Medicaid Other | Attending: Internal Medicine

## 2020-03-06 DIAGNOSIS — Z23 Encounter for immunization: Secondary | ICD-10-CM

## 2020-03-06 NOTE — Progress Notes (Signed)
   Covid-19 Vaccination Clinic  Name:  [name]    MRN: [id number] DOB: [birth year]  03/06/2020  [name] was observed post Covid-19 immunization for 15 minutes without incident. She was provided with Vaccine Information Sheet and instruction to access the V-Safe system.   [name] was instructed to call 911 with any severe reactions post vaccine: Marland Kitchen Difficulty breathing  . Swelling of face and throat  . A fast heartbeat  . A bad rash all over body  . Dizziness and weakness   Immunizations Administered    Name Date Dose VIS Date Route   Pfizer COVID-19 Vaccine 03/06/2020  1:10 PM 0.3 mL 11/29/2019 Intramuscular   Manufacturer: [place]   Lot: [id number]   [place]: [id number]

## 2020-03-31 ENCOUNTER — Ambulatory Visit: Payer: Medicaid Other | Attending: Internal Medicine

## 2020-03-31 DIAGNOSIS — Z23 Encounter for immunization: Secondary | ICD-10-CM

## 2020-03-31 NOTE — Progress Notes (Signed)
   Covid-19 Vaccination Clinic  Name:  [name]    MRN: [id number] DOB: 05-Oct-1976  03/31/2020  [name] was observed post Covid-19 immunization for 15 minutes without incident. She was provided with Vaccine Information Sheet and instruction to access the V-Safe system.   [name] was instructed to call 911 with any severe reactions post vaccine: Marland Kitchen Difficulty breathing  . Swelling of face and throat  . A fast heartbeat  . A bad rash all over body  . Dizziness and weakness   Immunizations Administered    Name Date Dose VIS Date Route   Pfizer COVID-19 Vaccine 03/31/2020  1:55 PM 0.3 mL 11/29/2019 Intramuscular   Manufacturer: [place]   Lot: [id number]   [place]: [id number]

## 2020-09-28 ENCOUNTER — Ambulatory Visit: Payer: Self-pay | Admitting: Obstetrics and Gynecology

## 2020-09-28 ENCOUNTER — Other Ambulatory Visit: Payer: Self-pay

## 2020-12-19 DIAGNOSIS — U071 COVID-19: Secondary | ICD-10-CM

## 2020-12-19 HISTORY — DX: COVID-19: U07.1

## 2021-01-02 ENCOUNTER — Other Ambulatory Visit: Payer: Self-pay

## 2021-01-02 ENCOUNTER — Emergency Department (HOSPITAL_COMMUNITY): Payer: 59

## 2021-01-02 ENCOUNTER — Encounter (HOSPITAL_COMMUNITY): Payer: Self-pay | Admitting: Emergency Medicine

## 2021-01-02 ENCOUNTER — Emergency Department (HOSPITAL_COMMUNITY)
Admission: EM | Admit: 2021-01-02 | Discharge: 2021-01-02 | Disposition: A | Discharge status: Home / Self Care | Payer: 59 | Attending: Emergency Medicine | Admitting: Emergency Medicine

## 2021-01-02 DIAGNOSIS — I10 Essential (primary) hypertension: Secondary | ICD-10-CM | POA: Diagnosis not present

## 2021-01-02 DIAGNOSIS — Z87891 Personal history of nicotine dependence: Secondary | ICD-10-CM | POA: Insufficient documentation

## 2021-01-02 DIAGNOSIS — Z79899 Other long term (current) drug therapy: Secondary | ICD-10-CM | POA: Diagnosis not present

## 2021-01-02 DIAGNOSIS — U071 COVID-19: Secondary | ICD-10-CM | POA: Diagnosis not present

## 2021-01-02 DIAGNOSIS — R0602 Shortness of breath: Secondary | ICD-10-CM | POA: Diagnosis present

## 2021-01-02 DIAGNOSIS — J1282 Pneumonia due to coronavirus disease 2019: Secondary | ICD-10-CM | POA: Insufficient documentation

## 2021-01-02 DIAGNOSIS — J45901 Unspecified asthma with (acute) exacerbation: Secondary | ICD-10-CM | POA: Diagnosis not present

## 2021-01-02 MED ORDER — BENZONATATE 100 MG PO CAPS: 100.0000 mg | ORAL_CAPSULE | Freq: Three times a day (TID) | ORAL | 0 refills | Status: DC

## 2021-01-02 MED ORDER — ONDANSETRON HCL 4 MG/2ML IJ SOLN
4.0000 mg | Freq: Once | INTRAMUSCULAR | Status: AC
  Administered 2021-01-02: 4 mg via INTRAVENOUS
  Filled 2021-01-02: qty 2

## 2021-01-02 MED ORDER — SODIUM CHLORIDE 0.9 % IV BOLUS
1000.0000 mL | Freq: Once | INTRAVENOUS | Status: AC
  Administered 2021-01-02: 1000 mL via INTRAVENOUS

## 2021-01-02 MED ORDER — ONDANSETRON HCL 4 MG PO TABS: 4.0000 mg | ORAL_TABLET | Freq: Four times a day (QID) | ORAL | 0 refills | Status: DC

## 2021-01-02 MED ORDER — ALBUTEROL SULFATE HFA 108 (90 BASE) MCG/ACT IN AERS
2.0000 | INHALATION_SPRAY | RESPIRATORY_TRACT | Status: DC | PRN
  Administered 2021-01-02: 2 via RESPIRATORY_TRACT
  Filled 2021-01-02: qty 6.7

## 2021-01-02 MED ORDER — AZITHROMYCIN 250 MG PO TABS: ORAL_TABLET | ORAL | 0 refills | Status: DC

## 2021-01-02 NOTE — Discharge Instructions (Addendum)
Recommendations for at home COVID-19 symptoms management:  Please continue isolation at home. Call [phone] to see whether you might be eligible for therapeutic antibody infusions (leave your name and they will call you back).  If have acute worsening of symptoms please go to ER/urgent care for further evaluation. Check pulse oximetry and if below 90-92% please go to ER. The following supplements MAY help:  Vitamin C 500mg  twice a day and Quercetin 250-500 mg twice a day Vitamin D3 2000 - 4000 u/day B Complex vitamins Zinc 75-100 mg/day Melatonin 6-10 mg at night (the optimal dose is unknown) Aspirin 81mg /day (if no history of bleeding issues)

## 2021-01-02 NOTE — ED Triage Notes (Signed)
Patient presents hx of asthma, COVID+. Patient states she tested positive Monday and yesterday began having worsening shortness of breath and chest pain. Patient states her inhaler has not been helping. Patient noted to be weak, shakey and lethargic. Patient endorses N/V, productive cough, and diarrhea.

## 2021-01-02 NOTE — ED Provider Notes (Signed)
        ED ECG REPORT   Date: 01/02/2021  Rate: 84  Rhythm: normal sinus rhythm  QRS Axis: normal  Intervals: normal  ST/T Wave abnormalities: nonspecific ST/T changes  Conduction Disutrbances:R VCD or RVH  Narrative Interpretation:   Old EKG Reviewed: none available  I have personally reviewed the EKG tracing and agree with the computerized printout as noted.  Medical screening examination/treatment/procedure(s) were performed by non-physician practitioner and as supervising physician I was immediately available for consultation/collaboration.     Rolland Porter, MD, Barbette Or, MD 01/02/21 [phone]

## 2021-01-02 NOTE — ED Provider Notes (Signed)
[facility] DEPT Provider Note   CSN: [id number] Arrival date & time: 01/02/21  [id number]     History Chief Complaint  Patient presents with  . COVID+  . Shortness of Breath    [name] is a 44 y.o. female.  The history is provided by the patient and medical records. No language interpreter was used.  Shortness of Breath    45 year old female significant history of bipolar, hypertension, asthma, anemia, recently tested positive for COVID-19 presenting complaining of COVID symptoms.  Patient developed symptoms approximately 7 days ago.  Symptoms include fever, chills, body aches, headache, congestion, decreased taste and smell, decreased appetite, nausea, vomiting, generalized weakness and fatigue as well as burning sensation in her chest.  Her symptoms becoming progressively worse and she also endorsed having shortness of breath.  She has been vaccinated for COVID-19 without booster.  Her son has tested positive as well about the same time that she did however he has minimal symptoms.  Patient states she feels dehydrated.  Past Medical History:  Diagnosis Date  . Anemia   . Asthma   . Bilateral ovarian cysts   . Bipolar 1 disorder, depressed, mild ([place])   . GERD (gastroesophageal reflux disease)   . History of blood transfusion 1994   during delivery  . HTN (hypertension)   . Migraines   . Pregnancy induced hypertension   . Seizures ([place])    during labor just that one time    Patient Active Problem List   Diagnosis Date Noted  . Fibroids 05/07/2016  . Chronic pelvic pain in female 05/07/2016  . Fibroid, uterine 05/05/2016  . Acute bronchitis   . Hypokalemia   . HTN (hypertension) 09/02/2013  . Asthma exacerbation 09/01/2013  . Tobacco abuse 09/01/2013    Past Surgical History:  Procedure Laterality Date  . CESAREAN SECTION    . INDUCED ABORTION    . LAPAROTOMY N/A 05/05/2016   Procedure: EXPLORATORY LAPAROTOMY WITH ULTRASOUND;   Surgeon: Waymon Amato, MD;  Location: [facility];  Service: Gynecology;  Laterality: N/A;  needs ultrasound ...Marland KitchenMarland KitchenMarland Kitchenlysis of adhesions   . MYOMECTOMY N/A 05/05/2016   Procedure: MYOMECTOMY, Biopsy Right Ovary & Peritoneum;  Surgeon: Waymon Amato, MD;  Location: [facility];  Service: Gynecology;  Laterality: N/A;  . TUBAL LIGATION       OB History    Gravida  3   Para  2   Term  1   Preterm  1   AB  1   Living  2     SAB  0   IAB  1   Ectopic  0   Multiple  0   Live Births  2           Family History  Problem Relation Age of Onset  . Asthma Mother   . Asthma Brother     Social History   Tobacco Use  . Smoking status: Former Smoker    Packs/day: 0.25    Years: 20.00    Pack years: 5.00    Types: Cigarettes    Quit date: 04/09/2014    Years since quitting: 6.7  . Smokeless tobacco: Never Used  Substance Use Topics  . Alcohol use: Yes    Comment: twice a year  . Drug use: Yes    Types: Marijuana    Comment: recreational use edible last week 04/18/16    Home Medications Prior to Admission medications   Medication Sig Start Date End Date Taking? Authorizing  Provider  albuterol (PROVENTIL HFA;VENTOLIN HFA) 108 (90 Base) MCG/ACT inhaler Inhale 2 puffs into the lungs every 6 (six) hours as needed for wheezing or shortness of breath.    [provider]  albuterol (PROVENTIL) (5 MG/ML) 0.5% nebulizer solution Take 0.5 mLs (2.5 mg total) by nebulization every 4 (four) hours as needed for wheezing or shortness of breath. 06/16/16   Forde Dandy, MD  Elagolix Sodium (ORILISSA) 150 MG TABS Take 1 tablet by mouth daily. 03/07/19   Emily Filbert, MD  hydrochlorothiazide (HYDRODIURIL) 25 MG tablet Take 25 mg by mouth daily.    [provider]  ibuprofen (ADVIL,MOTRIN) 200 MG tablet Take 400 mg by mouth daily as needed for moderate pain.     [provider]  omeprazole (PRILOSEC) 20 MG capsule Take 1 capsule (20 mg total) by mouth 2 (two) times daily before a  meal for 14 days. 12/15/17 12/29/17  Rodell Perna A, PA-C  Oxycodone HCl 10 MG TABS Take 10 mg by mouth 3 (three) times daily as needed (Pain).    [provider]  predniSONE (DELTASONE) 20 MG tablet Take 3 tablets (60 mg total) by mouth daily. 46/9/62   Delora Fuel, MD    Allergies    Vicodin [hydrocodone-acetaminophen] and Prednisone  Review of Systems   Review of Systems  Respiratory: Positive for shortness of breath.   All other systems reviewed and are negative.   Physical Exam Updated Vital Signs BP (!) 143/83 (BP Location: Right Arm)   Pulse 84   Temp 98.7 F (37.1 C) (Oral)   Resp 17   SpO2 98%   Physical Exam Vitals and nursing note reviewed.  Constitutional:      Appearance: She is well-developed and well-nourished.     Comments: Patient appears fatigued, spoke in soft voice, but nontoxic  HENT:     Head: Atraumatic.  Eyes:     Conjunctiva/sclera: Conjunctivae normal.  Cardiovascular:     Rate and Rhythm: Normal rate and regular rhythm.     Pulses: Normal pulses.     Heart sounds: Normal heart sounds.  Pulmonary:     Effort: Pulmonary effort is normal.     Breath sounds: Normal breath sounds.  Abdominal:     Palpations: Abdomen is soft.     Tenderness: There is no abdominal tenderness.  Musculoskeletal:     Cervical back: Normal range of motion and neck supple. No rigidity.     Comments: Global weakness with poor effort  Skin:    Findings: No rash.  Neurological:     Mental Status: She is alert and oriented to person, place, and time.  Psychiatric:        Mood and Affect: Mood and affect and mood normal.     ED Results / Procedures / Treatments   Labs (all labs ordered are listed, but only abnormal results are displayed) Labs Reviewed - No data to display  EKG None  Radiology DG Chest Portable 1 View  Result Date: 01/02/2021 CLINICAL DATA:  Chest pain.  COVID positive. EXAM: PORTABLE CHEST 1 VIEW COMPARISON:  November 20, 2018  FINDINGS: There are hazy airspace opacities at the left costophrenic angle. There are 2 nodular densities at the right lung base that are not well appreciated on the patient's prior chest x-rays. There is no pneumothorax. No large pleural effusion. The heart size is unremarkable. IMPRESSION: 1. Left lower lobe airspace opacities consistent with pneumonia. 2. Two nodular densities at the right lung  base, new since prior study. A 4-6 week follow-up two-view chest x-ray is recommended. Electronically Signed   By: Constance Holster M.D.   On: 01/02/2021 00:59    Procedures Procedures (including critical care time)  Medications Ordered in ED Medications  albuterol (VENTOLIN HFA) 108 (90 Base) MCG/ACT inhaler 2 puff (has no administration in time range)  sodium chloride 0.9 % bolus 1,000 mL (1,000 mLs Intravenous New Bag/Given 01/02/21 0126)  ondansetron (ZOFRAN) injection 4 mg (4 mg Intravenous Given 01/02/21 0126)    ED Course  I have reviewed the triage vital signs and the nursing notes.  Pertinent labs & imaging results that were available during my care of the patient were reviewed by me and considered in my medical decision making (see chart for details).    MDM Rules/Calculators/A&P                          BP 114/83   Pulse 83   Temp 98.7 F (37.1 C) (Oral)   Resp 16   SpO2 97%   Final Clinical Impression(s) / ED Diagnoses Final diagnoses:  Pneumonia due to COVID-19 virus    Rx / DC Orders ED Discharge Orders         Ordered    azithromycin (ZITHROMAX Z-PAK) 250 MG tablet        01/02/21 0335    ondansetron (ZOFRAN) 4 MG tablet  Every 6 hours        01/02/21 0335    benzonatate (TESSALON) 100 MG capsule  Every 8 hours        01/02/21 0335         1:14 AM Patient with COVID symptoms for the past week.  States she tested positive at an out patient clinic 5 days ago, son also tested positive as well.  She is here due to worsening symptoms.  Today her portal chest x-ray  demonstrated a left lower airspace opacity consistent with pneumonia as well as 2 nodular density in the right lung base new since prior study.  Given recent positive COVID infection I suspect finding on chest x-ray is consistent with COVID-pneumonia.  Patient fortunately is not hypoxic.  O2 sats at 98% on room air.  She is not hypotensive.  She does endorse nausea and feeling generalized weakness and feeling dehydrated.  Will give antinausea medication as well as IV fluid however patient does not meet criteria for admission.  I also doubt bacterial pneumonia in the setting of a positive COVID test  3:28 AM The patient did receive IV fluid, on recheck, O2 sats is 97% on room air.  Ding[place]dein of potential superimposed infection of her lung, will prescribe Z-Pak, cough medication, antinausea medication, and different over-the-counter medication that can help with her symptoms.  Will give referral to post-COVID clinic as well.  Return precautions given.  [name] was evaluated in Emergency Department on 01/02/2021 for the symptoms described in the history of present illness. She was evaluated in the context of the global COVID-19 pandemic, which necessitated consideration that the patient might be at risk for infection with the SARS-CoV-2 virus that causes COVID-19. Institutional protocols and algorithms that pertain to the evaluation of patients at risk for COVID-19 are in a state of rapid change based on information released by regulatory bodies including the CDC and federal and state organizations. These policies and algorithms were followed during the patient's care in the ED.    Domenic Moras, PA-C  01/02/21 [id number]    Rolland Porter, MD 01/02/21 0403

## 2021-04-09 ENCOUNTER — Other Ambulatory Visit: Payer: Self-pay

## 2021-04-09 ENCOUNTER — Other Ambulatory Visit (HOSPITAL_COMMUNITY)
Admission: RE | Admit: 2021-04-09 | Discharge: 2021-04-09 | Disposition: A | Discharge status: Home / Self Care | Payer: 59 | Source: Ambulatory Visit | Attending: Obstetrics and Gynecology | Admitting: Obstetrics and Gynecology

## 2021-04-09 ENCOUNTER — Encounter: Payer: Self-pay | Admitting: Obstetrics and Gynecology

## 2021-04-09 ENCOUNTER — Ambulatory Visit (INDEPENDENT_AMBULATORY_CARE_PROVIDER_SITE_OTHER): Payer: 59 | Admitting: Obstetrics and Gynecology

## 2021-04-09 VITALS — Wt 181.3 lb

## 2021-04-09 DIAGNOSIS — Z113 Encounter for screening for infections with a predominantly sexual mode of transmission: Secondary | ICD-10-CM

## 2021-04-09 DIAGNOSIS — Z01419 Encounter for gynecological examination (general) (routine) without abnormal findings: Secondary | ICD-10-CM | POA: Insufficient documentation

## 2021-04-09 DIAGNOSIS — N898 Other specified noninflammatory disorders of vagina: Secondary | ICD-10-CM | POA: Insufficient documentation

## 2021-04-09 DIAGNOSIS — N946 Dysmenorrhea, unspecified: Secondary | ICD-10-CM

## 2021-04-09 DIAGNOSIS — Z1231 Encounter for screening mammogram for malignant neoplasm of breast: Secondary | ICD-10-CM

## 2021-04-09 NOTE — Progress Notes (Signed)
ep

## 2021-04-12 ENCOUNTER — Encounter: Payer: Self-pay | Admitting: Obstetrics and Gynecology

## 2021-04-12 LAB — CERVICOVAGINAL ANCILLARY ONLY
Bacterial Vaginitis (gardnerella): NEGATIVE
Candida Glabrata: NEGATIVE
Candida Vaginitis: NEGATIVE
Chlamydia: NEGATIVE
Comment: NEGATIVE
Comment: NEGATIVE
Comment: NEGATIVE
Comment: NEGATIVE
Comment: NEGATIVE
Comment: NORMAL
Neisseria Gonorrhea: NEGATIVE
Trichomonas: NEGATIVE

## 2021-04-12 NOTE — Progress Notes (Signed)
Obstetrics and Gynecology Annual Patient Evaluation  Appointment Date: 04/09/2021  OBGYN Clinic: [facility]  Primary Care Provider: Dustin Flock  Referring Provider: Dustin Flock, PA-C  Chief Complaint:  Chief Complaint  Patient presents with  . Gynecologic Exam    History of Present Illness: [name] is a 45 y.o. African-American [id number] (Patient's last menstrual period was 03/21/2021 (approximate).), seen for the above chief complaint. Her past medical history is significant for h/o open myomectomy, pathology confirmed endometriosis, h/o c-section x 2, pelvic adhesive disease, HTN, BTL  Patient notes dysmenorrhea.  No hot flashes, night sweats, vaginal dryness  She notes she has very painful periods that have been going on for 15 years. Her periods are regular every month and with menses occurring for 5-7 days.  She notes heavy bleeding with medium sized clots. Her cramping will begin the week before her period and resolve once her menses is over. She also gets acne lesions on her lower jaw line extending throughout her neck. The lesions leave dark spots once healed.  She has a history of a myomectomy on 05/05/2016 due to fibroids and endometriosis. She did not take any medications following surgery. She notes her pain was resolved for about 6-8 months then returned. She presented to the [facility] on 09/10/2017 due to abdominal pain. [place] at the time showed left posterior fibroid (see below).   She had a tubal ligation done for birth control. She requests a full STD panel. She is due for her mammogram. She denies any breast masses. No other complaints.  Review of Systems: Pertinent items noted in HPI and remainder of comprehensive ROS otherwise negative.   Patient Active Problem List   Diagnosis Date Noted  . Endometriosis 05/07/2016  . Chronic pelvic pain in female 05/07/2016  . Fibroid, uterine 05/05/2016  . Hypokalemia   . HTN  (hypertension) 09/02/2013  . Asthma exacerbation 09/01/2013  . Tobacco abuse 09/01/2013    Past Medical History:  Past Medical History:  Diagnosis Date  . Acute bronchitis   . Anemia   . Asthma   . Bilateral ovarian cysts   . Bipolar 1 disorder, depressed, mild ([place])   . COVID-19 12/2020  . GERD (gastroesophageal reflux disease)   . History of blood transfusion 1994   during delivery  . HTN (hypertension)   . Migraines   . Pregnancy induced hypertension   . Seizures ([place])    during labor just that one time    Past Surgical History:  Past Surgical History:  Procedure Laterality Date  . CESAREAN SECTION    . LAPAROTOMY N/A 05/05/2016   Procedure: EXPLORATORY LAPAROTOMY WITH ULTRASOUND;  Surgeon: Waymon Amato, MD;  Location: [facility];  Service: Gynecology;  Laterality: N/A;  needs ultrasound ...Marland KitchenMarland KitchenMarland Kitchenlysis of adhesions   . MYOMECTOMY N/A 05/05/2016   Procedure: MYOMECTOMY, Biopsy Right Ovary & Peritoneum;  Surgeon: Waymon Amato, MD;  Location: [facility];  Service: Gynecology;  Laterality: N/A;  . TUBAL LIGATION      Past Obstetrical History:  OB History  Gravida Para Term Preterm AB Living  3 2 1 1 1 2   SAB IAB Ectopic Multiple Live Births  0 1 0 0 2    # Outcome Date GA Lbr Len/2nd Weight Sex Delivery Anes PTL Lv  3 Preterm 11/19/95 [redacted]w[redacted]d   F CS-LTranv     2 Term 07/21/93    F CS-LTranv     1 IAB  Past Gynecological History: As per HPI. History of Pap Smear(s): Yes.   Last pap 02/2019, which was negative pap and hpv  Social History:  Social History   Socioeconomic History  . Marital status: Single    Spouse name: Not on file  . Number of children: Not on file  . Years of education: Not on file  . Highest education level: Not on file  Occupational History  . Not on file  Tobacco Use  . Smoking status: Former Smoker    Packs/day: 0.25    Years: 20.00    Pack years: 5.00    Types: Cigarettes    Quit date: 04/09/2014    Years since quitting: 7.0  . Smokeless  tobacco: Never Used  Substance and Sexual Activity  . Alcohol use: Yes    Comment: twice a year  . Drug use: Yes    Types: Marijuana    Comment: recreational use edible last week 04/18/16  . Sexual activity: Yes    Birth control/protection: Surgical  Other Topics Concern  . Not on file  Social History Narrative  . Not on file   Social Determinants of Health   Financial Resource Strain: Not on file  Food Insecurity: Not on file  Transportation Needs: Not on file  Physical Activity: Not on file  Stress: Not on file  Social Connections: Not on file  Intimate Partner Violence: Not on file    Family History:  Family History  Problem Relation Age of Onset  . Asthma Mother   . Asthma Brother     Medications [name] had no medications administered during this visit. Current Outpatient Medications  Medication Sig Dispense Refill  . albuterol (PROVENTIL HFA;VENTOLIN HFA) 108 (90 Base) MCG/ACT inhaler Inhale 2 puffs into the lungs every 6 (six) hours as needed for wheezing or shortness of breath.    Marland Kitchen albuterol (PROVENTIL) (5 MG/ML) 0.5% nebulizer solution Take 0.5 mLs (2.5 mg total) by nebulization every 4 (four) hours as needed for wheezing or shortness of breath. 20 mL 12  . hydrochlorothiazide (HYDRODIURIL) 25 MG tablet Take 25 mg by mouth daily.    . Oxycodone HCl 10 MG TABS Take 10 mg by mouth 3 (three) times daily as needed (Pain).    . famotidine (PEPCID) 40 MG tablet SMARTSIG:1 Tablet(s) By Mouth Every Evening    . omeprazole (PRILOSEC) 20 MG capsule Take 1 capsule (20 mg total) by mouth 2 (two) times daily before a meal for 14 days. 28 capsule 0   No current facility-administered medications for this visit.    Allergies Vicodin [hydrocodone-acetaminophen] and Prednisone   Physical Exam:  Wt 181 lb 4.8 oz (82.2 kg)   LMP 03/21/2021 (Approximate)   BMI 35.41 kg/m  Body mass index is 35.41 kg/m.  General appearance: Well nourished, well developed female  in no acute distress.  Neck:  Supple, normal appearance, and no thyromegaly  Cardiovascular: normal s1 and s2.  No murmurs, rubs or gallops. Respiratory:  Clear to auscultation bilateral. Normal respiratory effort Abdomen: positive bowel sounds and no masses, hernias; diffusely non tender to palpation, non distended Breasts: breasts appear normal, no suspicious masses, no skin or nipple changes or axillary nodes, and normal palpation. Neuro/Psych:  Normal mood and affect.  Skin:  Warm and dry.  Lymphatic:  No inguinal lymphadenopathy.   Pelvic exam: is not limited by body habitus EGBUS: within normal limits Vagina: within normal limits and with no blood or discharge in the vault Cervix: normal  appearing cervix without tenderness, discharge or lesions. Uterus:  nonenlarged and non tender Adnexa:  normal adnexa and no mass, fullness, tenderness Rectovaginal: deferred  Laboratory: none  Radiology: no new imaging Narrative & Impression  CLINICAL DATA:  Right lower quadrant pain.  EXAM: TRANSABDOMINAL AND TRANSVAGINAL ULTRASOUND OF PELVIS  TECHNIQUE: Both transabdominal and transvaginal ultrasound examinations of the pelvis were performed. Transabdominal technique was performed for global imaging of the pelvis including uterus, ovaries, adnexal regions, and pelvic cul-de-sac. It was necessary to proceed with endovaginal exam following the transabdominal exam to visualize the endometrium and ovaries.  COMPARISON:  None  FINDINGS: Uterus  Measurements: 7.8 x 4.5 x 5.7 cm. There is a 12 mm fibroid in the posterior uterus.  Endometrium  Thickness: 5.9 mm.  No focal abnormality visualized.  Right ovary  Measurements: 3.7 x 2.2 x 3.07. Normal appearance/no adnexal mass.  Left ovary  Measurements: 4.0 x 2.1 x 2.2 cm. Contains a dominant follicle.  Other findings  No abnormal free fluid.  IMPRESSION: 1. There is a dominant follicle in the left ovary. 2.  Small fibroid in the posterior uterus. 3. No acute abnormalities.   Electronically Signed   By: Dorise Bullion III M.D   On: 09/10/2017 15:30   Assessment: pt stable  Plan:  1. Well woman exam Routine care. Pt desires std screening today. Pt amenable to mammogram screening - HIV Antibody (routine testing w rflx) - RPR - Hepatitis B Surface AntiGEN - Hepatitis C Antibody - US PELVIC COMPLETE WITH TRANSVAGINAL; Future - MM 3D SCREEN BREAST BILATERAL; Future - Cervicovaginal ancillary only( [place])  2. Screen for STD (sexually transmitted disease) - HIV Antibody (routine testing w rflx) - RPR - Hepatitis B Surface AntiGEN - Hepatitis C Antibody - Cervicovaginal ancillary only( [place])  3. Dysmenorrhea and history of endometriosis Recommended updated pelvic u/s and d/w her re: next steps after that but I d/w her that endo is a chronic disease only resolved once in menopause so some type of progestin is needed. I told her that surgery would not be my first recommendation, given high risk of scar tissue and complications from surgery, especially without first trying medications first, which she is amenable to.   15 minutes - US PELVIC COMPLETE WITH TRANSVAGINAL; Future  4. Vaginal discharge - Cervicovaginal ancillary only( [place])  5. Breast cancer screening by mammogram - MM 3D SCREEN BREAST BILATERAL; Future  Orders Placed This Encounter  Procedures  . US PELVIC COMPLETE WITH TRANSVAGINAL  . MM 3D SCREEN BREAST BILATERAL  . HIV Antibody (routine testing w rflx)  . RPR  . Hepatitis B Surface AntiGEN  . Hepatitis C Antibody   RTC after u/s is done for follow up  Durene Romans MD Attending Center for [facility])

## 2021-04-13 LAB — HEPATITIS C ANTIBODY: Hep C Virus Ab: 0.2 s/co ratio (ref 0.0–0.9)

## 2021-04-13 LAB — HEPATITIS B SURFACE ANTIGEN: Hepatitis B Surface Ag: NEGATIVE

## 2021-04-13 LAB — RPR: RPR Ser Ql: NONREACTIVE

## 2021-04-13 LAB — HIV ANTIBODY (ROUTINE TESTING W REFLEX): HIV Screen 4th Generation wRfx: NONREACTIVE

## 2021-04-15 ENCOUNTER — Other Ambulatory Visit: Payer: Self-pay

## 2021-04-15 ENCOUNTER — Ambulatory Visit (HOSPITAL_COMMUNITY)
Admission: RE | Admit: 2021-04-15 | Discharge: 2021-04-15 | Disposition: A | Discharge status: Home / Self Care | Payer: 59 | Source: Ambulatory Visit | Attending: Obstetrics and Gynecology | Admitting: Obstetrics and Gynecology

## 2021-04-15 DIAGNOSIS — Z01419 Encounter for gynecological examination (general) (routine) without abnormal findings: Secondary | ICD-10-CM | POA: Diagnosis present

## 2021-04-15 DIAGNOSIS — N946 Dysmenorrhea, unspecified: Secondary | ICD-10-CM | POA: Diagnosis present

## 2021-04-23 ENCOUNTER — Encounter: Payer: Self-pay | Admitting: Obstetrics and Gynecology

## 2021-05-21 ENCOUNTER — Ambulatory Visit: Payer: 59 | Admitting: Obstetrics and Gynecology

## 2021-05-21 ENCOUNTER — Encounter: Payer: Self-pay | Admitting: Obstetrics and Gynecology

## 2021-05-22 NOTE — Progress Notes (Signed)
Patient did not keep her GYN appointment for 05/21/2021.  Durene Romans MD Attending [facility] for [facility])

## 2021-05-31 ENCOUNTER — Ambulatory Visit: Payer: Self-pay

## 2021-08-10 ENCOUNTER — Other Ambulatory Visit: Payer: Self-pay | Admitting: *Deleted

## 2021-08-10 DIAGNOSIS — Z1231 Encounter for screening mammogram for malignant neoplasm of breast: Secondary | ICD-10-CM

## 2021-08-12 ENCOUNTER — Ambulatory Visit
Admission: RE | Admit: 2021-08-12 | Discharge: 2021-08-12 | Disposition: A | Discharge status: Home / Self Care | Payer: No Typology Code available for payment source | Source: Ambulatory Visit | Attending: Physician Assistant | Admitting: Physician Assistant

## 2021-08-12 ENCOUNTER — Other Ambulatory Visit: Payer: Self-pay

## 2021-08-12 DIAGNOSIS — Z1231 Encounter for screening mammogram for malignant neoplasm of breast: Secondary | ICD-10-CM

## 2022-07-02 IMAGING — MG MM DIGITAL SCREENING BILAT W/ TOMO AND CAD
8 series · 8 of 24 positions shown · non-contrast
Comparison: Previous exam(s).

CLINICAL DATA: Screening.

EXAM:
DIGITAL SCREENING BILATERAL MAMMOGRAM WITH TOMOSYNTHESIS AND CAD
TECHNIQUE: Bilateral screening digital craniocaudal and mediolateral oblique
mammograms were obtained. Bilateral screening digital breast
tomosynthesis was performed. The images were evaluated with
computer-aided detection.

[L MLO synth-2D]
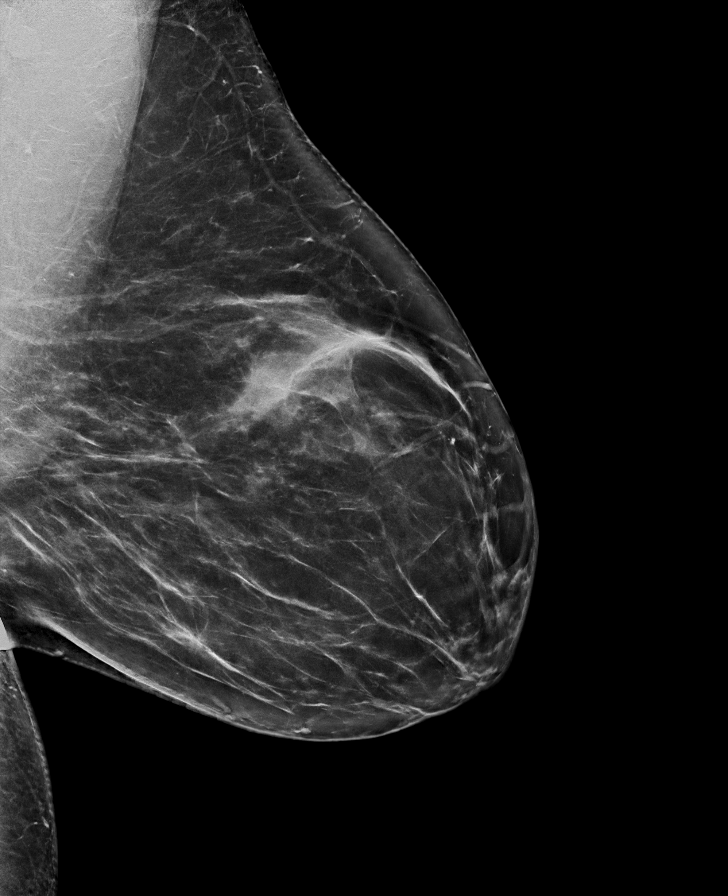

[L CC synth-2D]
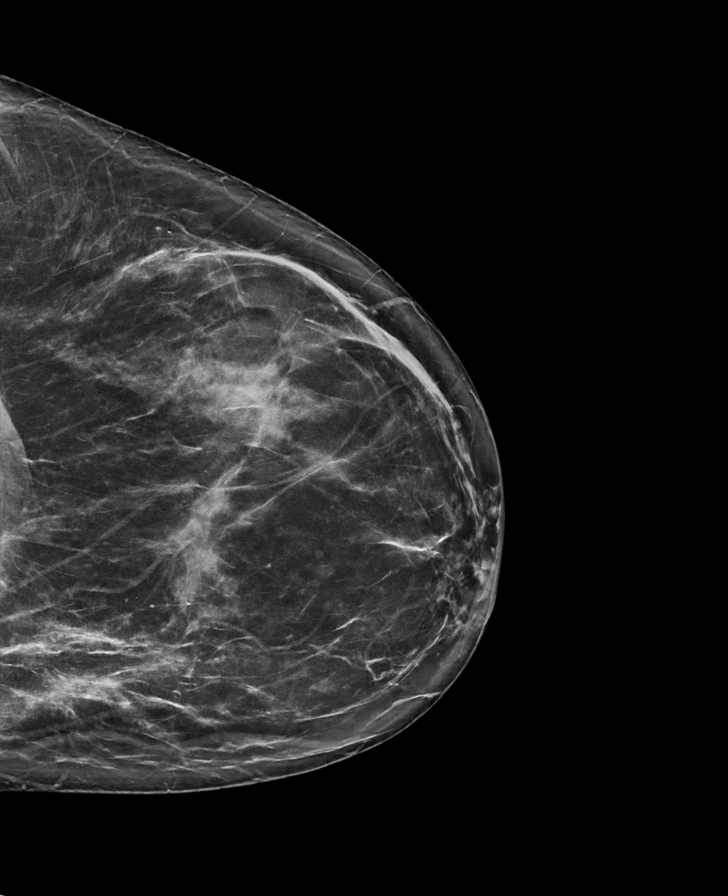

[R MLO synth-2D]
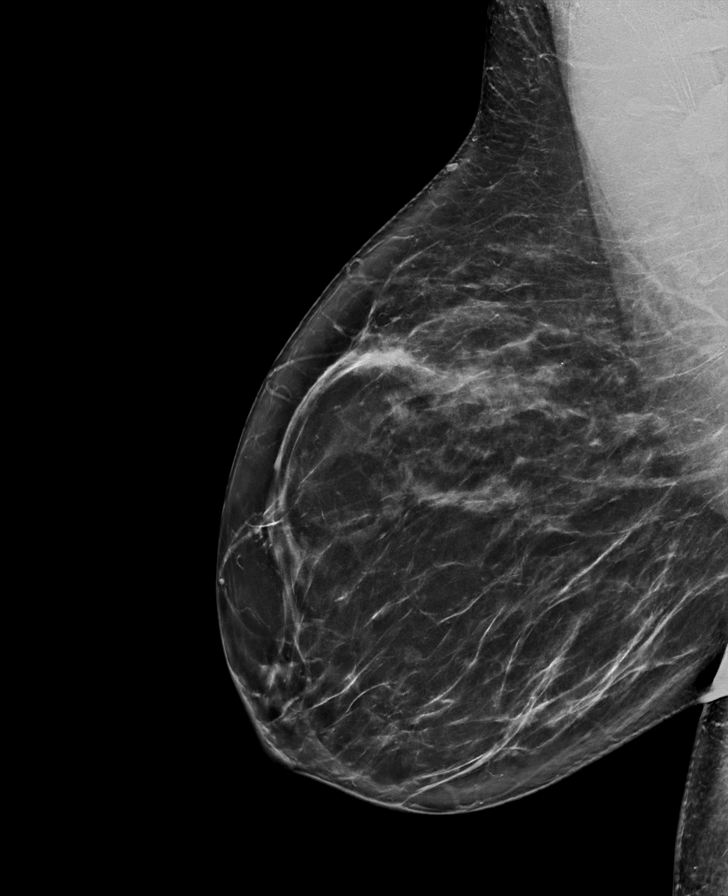

[R CC synth-2D]
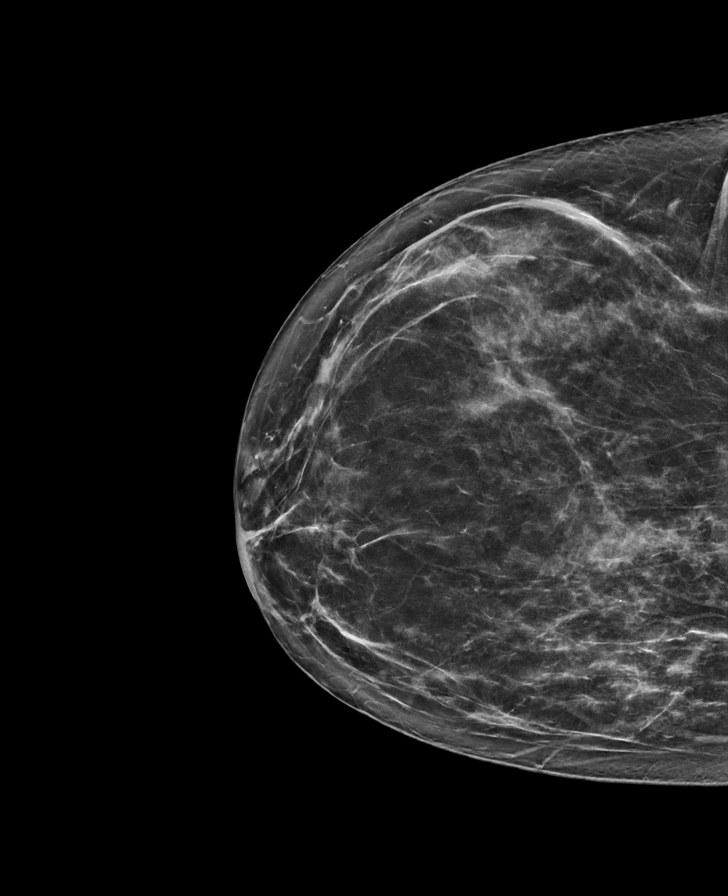

[L CC tomo · tomo slice 39/77.0]
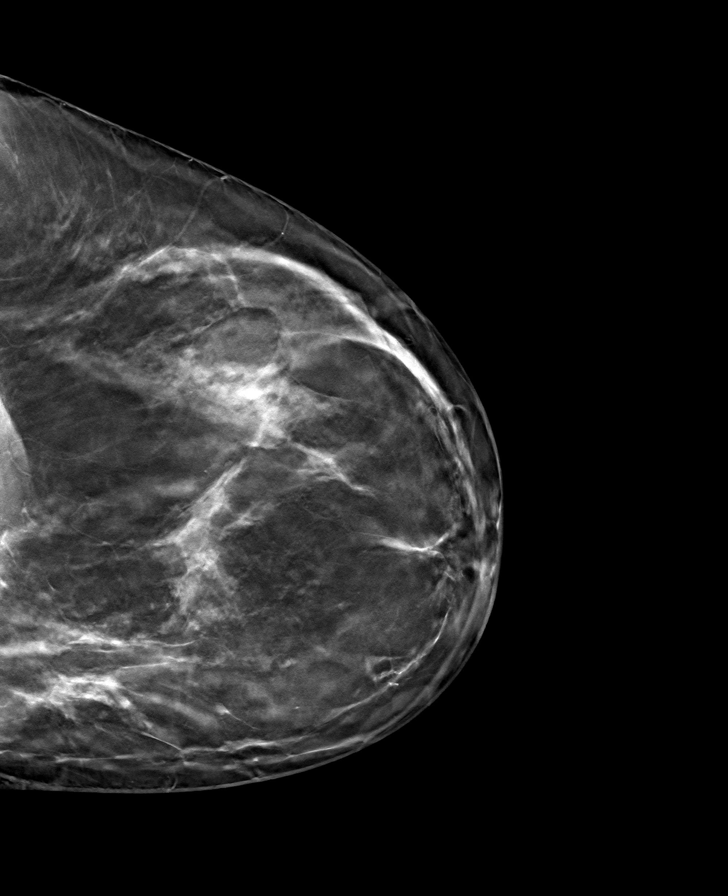

[R CC tomo · tomo slice 38/75.0]
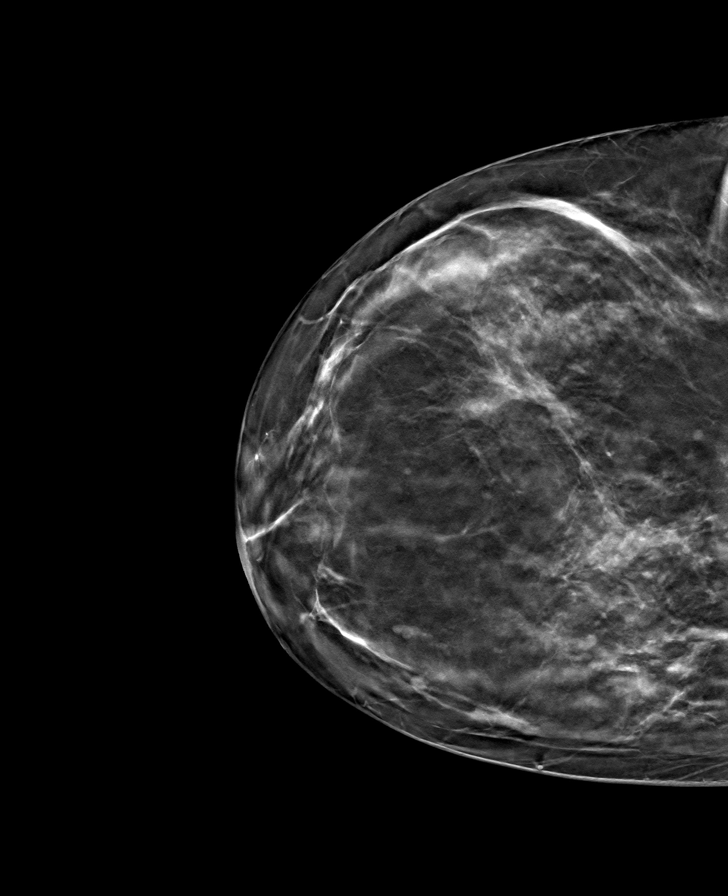

[L MLO tomo · tomo slice 45/88.0]
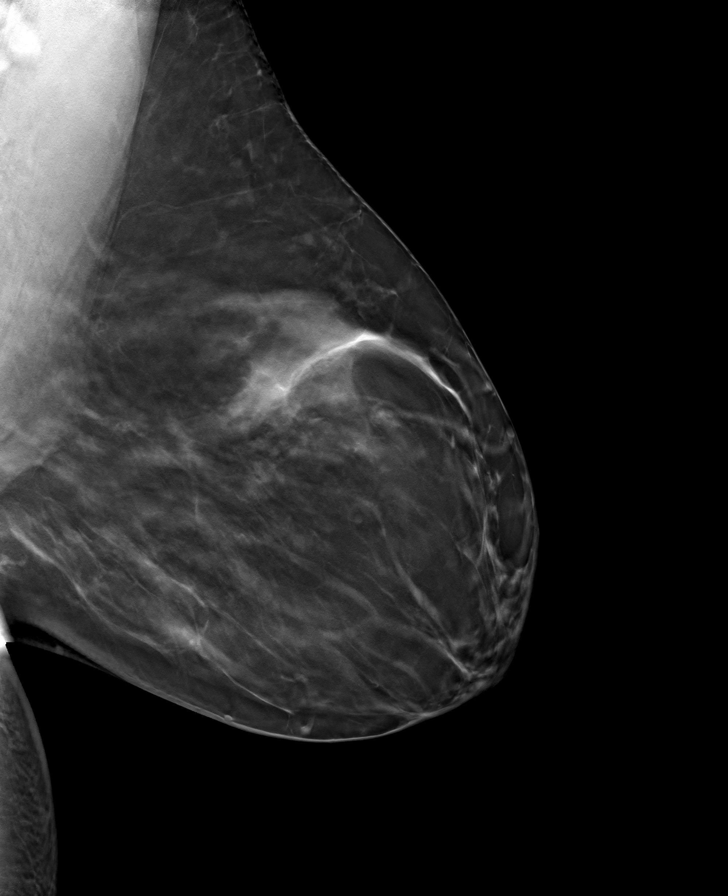

[R MLO tomo · tomo slice 44/87.0]
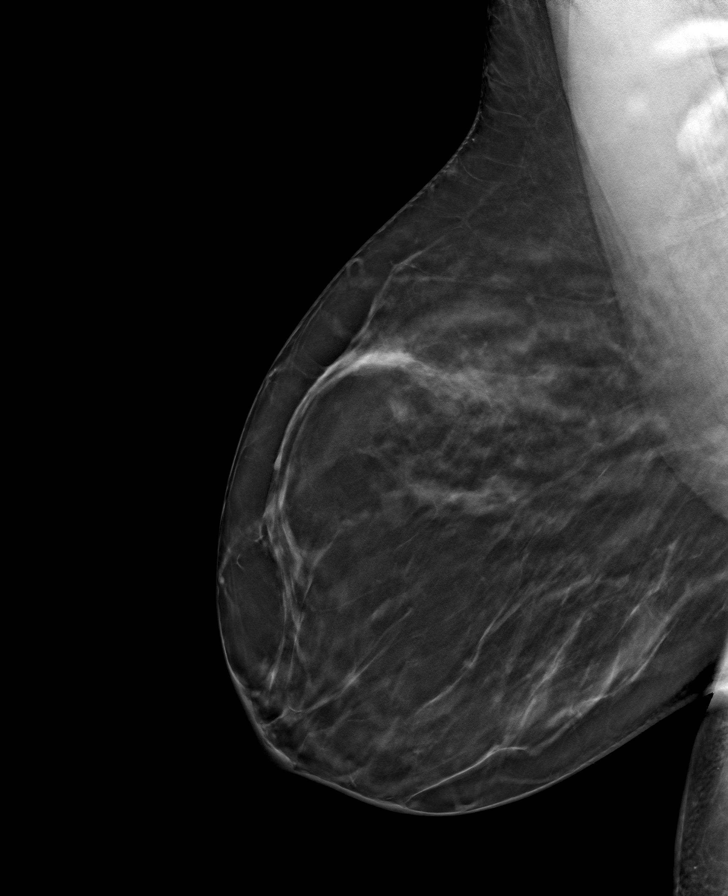

[8 of 24 positions shown; findings below may reference images not displayed]

ACR Breast Density Category b: There are scattered areas of
fibroglandular density.
FINDINGS: There are no findings suspicious for malignancy.
IMPRESSION: No mammographic evidence of malignancy. A result letter of this
screening mammogram will be mailed directly to the patient.

RECOMMENDATION:
Screening mammogram in one year. (Code:51-O-LD2)

BI-RADS CATEGORY  1: Negative.

## 2022-09-22 ENCOUNTER — Other Ambulatory Visit: Payer: Self-pay | Admitting: Obstetrics and Gynecology

## 2022-09-22 DIAGNOSIS — Z1231 Encounter for screening mammogram for malignant neoplasm of breast: Secondary | ICD-10-CM

## 2022-10-21 ENCOUNTER — Ambulatory Visit
Admission: RE | Admit: 2022-10-21 | Discharge: 2022-10-21 | Disposition: A | Discharge status: Home / Self Care | Payer: Commercial Managed Care - HMO | Source: Ambulatory Visit | Attending: Obstetrics and Gynecology | Admitting: Obstetrics and Gynecology

## 2022-10-21 DIAGNOSIS — Z1231 Encounter for screening mammogram for malignant neoplasm of breast: Secondary | ICD-10-CM

## 2022-11-18 DIAGNOSIS — Z419 Encounter for procedure for purposes other than remedying health state, unspecified: Secondary | ICD-10-CM | POA: Diagnosis not present

## 2022-12-19 DIAGNOSIS — Z419 Encounter for procedure for purposes other than remedying health state, unspecified: Secondary | ICD-10-CM | POA: Diagnosis not present

## 2023-01-19 DIAGNOSIS — Z419 Encounter for procedure for purposes other than remedying health state, unspecified: Secondary | ICD-10-CM | POA: Diagnosis not present

## 2023-02-17 DIAGNOSIS — Z419 Encounter for procedure for purposes other than remedying health state, unspecified: Secondary | ICD-10-CM | POA: Diagnosis not present

## 2023-03-02 ENCOUNTER — Ambulatory Visit (INDEPENDENT_AMBULATORY_CARE_PROVIDER_SITE_OTHER): Payer: No Typology Code available for payment source | Admitting: Obstetrics and Gynecology

## 2023-03-02 ENCOUNTER — Encounter: Payer: Self-pay | Admitting: Obstetrics and Gynecology

## 2023-03-02 ENCOUNTER — Other Ambulatory Visit: Payer: Self-pay

## 2023-03-02 ENCOUNTER — Other Ambulatory Visit (HOSPITAL_COMMUNITY)
Admission: RE | Admit: 2023-03-02 | Discharge: 2023-03-02 | Disposition: A | Discharge status: Home / Self Care | Payer: No Typology Code available for payment source | Source: Ambulatory Visit | Attending: Obstetrics and Gynecology | Admitting: Obstetrics and Gynecology

## 2023-03-02 VITALS — BP 127/84 | HR 90 | Ht 60.0 in | Wt 184.0 lb

## 2023-03-02 DIAGNOSIS — Z124 Encounter for screening for malignant neoplasm of cervix: Secondary | ICD-10-CM | POA: Diagnosis not present

## 2023-03-02 DIAGNOSIS — N809 Endometriosis, unspecified: Secondary | ICD-10-CM | POA: Diagnosis not present

## 2023-03-02 DIAGNOSIS — Z113 Encounter for screening for infections with a predominantly sexual mode of transmission: Secondary | ICD-10-CM | POA: Insufficient documentation

## 2023-03-02 DIAGNOSIS — D219 Benign neoplasm of connective and other soft tissue, unspecified: Secondary | ICD-10-CM | POA: Diagnosis not present

## 2023-03-02 DIAGNOSIS — N649 Disorder of breast, unspecified: Secondary | ICD-10-CM

## 2023-03-02 DIAGNOSIS — Z01419 Encounter for gynecological examination (general) (routine) without abnormal findings: Secondary | ICD-10-CM | POA: Diagnosis present

## 2023-03-02 MED ORDER — NYSTATIN 100000 UNIT/GM EX OINT: 1.0000 | TOPICAL_OINTMENT | Freq: Two times a day (BID) | CUTANEOUS | 0 refills | Status: DC

## 2023-03-02 MED ORDER — NORETHINDRONE 0.35 MG PO TABS: 1.0000 | ORAL_TABLET | Freq: Every day | ORAL | 11 refills | Status: DC

## 2023-03-02 NOTE — Patient Instructions (Addendum)
Nystatin cream for the breast itching. You can use anti-chafe products underneath the breast.   We ordered STI testing and did your pap smear today.   We will start a low dose progesterone pill and see if that helps with your bleeding and pain with your menstrual cycle.   Review of other options: wait for menopause, endometrial ablation, uterine artery embolization, sonata, and hysterectomy

## 2023-03-02 NOTE — Progress Notes (Signed)
ANNUAL EXAM Patient name: [name] MRN [id number]  Date of birth: December 26, 1975 Chief Complaint:   Annual Exam  History of Present Illness:   [name] is a 47 y.o. [phone] being seen today for a routine annual exam.  Current complaints: annual 2 weeks of "ringworm" lesion on left breast - using yeast infection cream without resolution of symptoms, otherwise no additional breast issues  Menstrual concerns? Yes   - painful and lasting 7 days When last seen had an [place] ordered due to hx of myomectomy to assess for fibroids Breast or nipple changes? Yes  Contraception use? Yes  Sexually active? Yes  CHC contraindication - HTN on meds  HX REVIEW: cesarean section, open myomectomy, tubal ligation  Op note:  Pathologically confirmed endometriosis  Patient's last menstrual period was 02/22/2023.  Last pap     Component Value Date/Time   DIAGPAP  03/07/2019 0000    NEGATIVE FOR INTRAEPITHELIAL LESIONS OR MALIGNANCY.   ADEQPAP  03/07/2019 0000    Satisfactory for evaluation  endocervical/transformation zone component PRESENT.    Last mammogram: 10/2022. BIRADS 1 Last colonoscopy: none on file     03/02/2023    1:38 PM 03/07/2019    9:44 AM  Depression screen PHQ 2/9  Decreased Interest 0 1  Down, Depressed, Hopeless 0 0  PHQ - 2 Score 0 1  Altered sleeping 0 1  Tired, decreased energy 0 1  Change in appetite 0 0  Feeling bad or failure about yourself  0 0  Trouble concentrating 0 0  Moving slowly or fidgety/restless 0 0  Suicidal thoughts 0 0  PHQ-9 Score 0 3        03/02/2023    1:38 PM 03/07/2019    9:44 AM  GAD 7 : Generalized Anxiety Score  Nervous, Anxious, on Edge 0 0  Control/stop worrying 0 0  Worry too much - different things 1 0  Trouble relaxing 0 0  Restless 0 0  Easily annoyed or irritable 0 0  Afraid - awful might happen 0 0  Total GAD 7 Score 1 0     Review of Systems:   Pertinent items are noted in HPI Denies any headaches, blurred  vision, fatigue, shortness of breath, chest pain, abdominal pain, abnormal vaginal discharge/itching/odor/irritation, problems with periods, bowel movements, urination, or intercourse unless otherwise stated above. Pertinent History Reviewed:  Reviewed past medical,surgical, social and family history.  Reviewed problem list, medications and allergies. Physical Assessment:   Vitals:   03/02/23 1333  BP: 127/84  Pulse: 90  Weight: 184 lb (83.5 kg)  Height: 5' (1.524 m)  Body mass index is 35.94 kg/m.        Physical Examination:   General appearance - well appearing, and in no distress  Mental status - alert, oriented to person, place, and time  Psych:  She has a normal mood and affect  Skin - warm and dry, normal color, no suspicious lesions noted  Chest - effort normal, all lung fields clear to auscultation bilaterally  Heart - normal rate and regular rhythm  Breasts - breasts normal with 1 cm hyperpigmented lesion on left breast at 1 oclock - non-tender without surrounding erythema or nodules deep to lesion, no suspicious masses, no nipple changes or axillary nodes  Abdomen - soft, nontender, nondistended, no masses or organomegaly  Pelvic -  VULVA: normal appearing vulva with no masses, tenderness or lesions   VAGINA: normal appearing vagina with normal color and discharge,  no lesions   CERVIX: normal appearing cervix without discharge or lesions, no CMT Non-tender bilateral levator ani   Thin prep pap is done with HR HPV cotesting  UTERUS: uterus difficult to palpate   ADNEXA: No adnexal masses or tenderness noted.  Extremities:  No swelling or varicosities noted  Chaperone present for exam  Narrative & Impression  CLINICAL DATA:  Initial evaluation for history of fibroids and endometriosis.   EXAM: TRANSABDOMINAL AND TRANSVAGINAL ULTRASOUND OF PELVIS   TECHNIQUE: Both transabdominal and transvaginal ultrasound examinations of the pelvis were performed. Transabdominal  technique was performed for global imaging of the pelvis including uterus, ovaries, adnexal regions, and pelvic cul-de-sac. It was necessary to proceed with endovaginal exam following the transabdominal exam to visualize the uterus, endometrium, and ovaries.   COMPARISON:  Prior ultrasound from 09/10/2017   FINDINGS: Uterus   Measurements: 8.9 x 5.7 x 5.8 cm = volume: 151.9 mL. Uterus is anteverted. Heterogeneous echotexture seen within the uterine myometrium. Several uterine fibroids are seen.   1. 2.7 x 1.8 x 2.6 cm intramural fibroid present at the right posterior uterine body. 2. 1.9 x 1.4 x 2.0 cm subserosal fibroid present at the right posterior uterine fundus. 3. 1.6 x 1.2 x 1.2 cm intramural fibroid present at the left aspect of the lower uterine segment.   Endometrium   Thickness: 10.6 mm.  No focal abnormality visualized.   Right ovary   Measurements: 4.2 x 2.5 x 2.9 cm = volume: 16.0 mL. Normal appearance/no adnexal mass.   Left ovary   Measurements: 3.0 x 1.7 x 2.7 cm = volume: 7.2 mL. Normal appearance/no adnexal mass.   Other findings   Trace free physiologic fluid noted within the pelvis.   IMPRESSION: 1. Fibroid uterus as detailed above. 2. Otherwise unremarkable and normal pelvic ultrasound. No adnexal mass or free fluid within the pelvis.     Electronically Signed   By: Jeannine Boga M.D.   On: 04/15/2021 19:30        Assessment & Plan:  1. Well woman exam with routine gynecological exam All questions answered - Cervicovaginal ancillary only  2. Endometriosis Pathology confirmed endometriosis from piror surgery. Discussed that part of first line of treatment is menstrual suppression. Offered POP trial for menstrual suppression - norethindrone (MICRONOR) 0.35 MG tablet; Take 1 tablet (0.35 mg total) by mouth daily.  Dispense: 30 tablet; Refill: 11  3. Breast lesion Small breast lesion with reported pruritus - trial of nystatin. If  no improvement, follow up with PCP/derm - nystatin ointment (MYCOSTATIN); Apply 1 Application topically 2 (two) times daily.  Dispense: 30 g; Refill: 0  4. Screening for cervical cancer Routine pap collected - Cytology - PAP( [facility])  5. Fibroid Reviewed [place] images from 2021. Noted there is possibility of changes since then. Reviewed continued expectant management, medical management and surgical management. Patient noted that ultimately would be interested in hysterectomy but aware of increased risk of intraabdominal adhesions/scarring. Offered medical management with POP to see if menses and endometriosis are managed accordingly. Follow up response in ~3 months. Discussed management options for abnormal uterine bleeding including NSAIDs (Naproxen), tranexamic acid (Lysteda), oral progesterone, Depo Provera, Levonogestrel IUD, endometrial ablation or hysterectomy as definitive surgical management.  Discussed risks and benefits of each method.   - norethindrone (MICRONOR) 0.35 MG tablet; Take 1 tablet (0.35 mg total) by mouth daily.  Dispense: 30 tablet; Refill: 11  6. Routine screening for STI (sexually transmitted infection) Screening  - Cervicovaginal  ancillary only - RPR+HBsAg+HCVAb+...   Orders Placed This Encounter  Procedures   RPR+HBsAg+HCVAb+...    Meds:  Meds ordered this encounter  Medications   nystatin ointment (MYCOSTATIN)    Sig: Apply 1 Application topically 2 (two) times daily.    Dispense:  30 g    Refill:  0   norethindrone (MICRONOR) 0.35 MG tablet    Sig: Take 1 tablet (0.35 mg total) by mouth daily.    Dispense:  30 tablet    Refill:  11    Follow-up: Return in about 3 months (around 06/02/2023).  Darliss Cheney, MD 03/02/2023 6:43 PM

## 2023-03-03 LAB — RPR+HBSAG+HCVAB+...
HIV Screen 4th Generation wRfx: NONREACTIVE
Hep C Virus Ab: NONREACTIVE
Hepatitis B Surface Ag: NEGATIVE
RPR Ser Ql: NONREACTIVE

## 2023-03-03 LAB — CERVICOVAGINAL ANCILLARY ONLY
Bacterial Vaginitis (gardnerella): NEGATIVE
Candida Glabrata: NEGATIVE
Candida Vaginitis: NEGATIVE
Chlamydia: NEGATIVE
Comment: NEGATIVE
Comment: NEGATIVE
Comment: NEGATIVE
Comment: NEGATIVE
Comment: NEGATIVE
Comment: NORMAL
Neisseria Gonorrhea: NEGATIVE
Trichomonas: NEGATIVE

## 2023-03-06 LAB — CYTOLOGY - PAP
Adequacy: ABSENT
Comment: NEGATIVE
Diagnosis: NEGATIVE
High risk HPV: NEGATIVE

## 2023-03-20 DIAGNOSIS — Z419 Encounter for procedure for purposes other than remedying health state, unspecified: Secondary | ICD-10-CM | POA: Diagnosis not present

## 2023-11-27 ENCOUNTER — Other Ambulatory Visit: Payer: Self-pay | Admitting: Physician Assistant

## 2023-11-27 DIAGNOSIS — Z1231 Encounter for screening mammogram for malignant neoplasm of breast: Secondary | ICD-10-CM

## 2023-12-28 ENCOUNTER — Ambulatory Visit: Payer: No Typology Code available for payment source

## 2024-01-29 ENCOUNTER — Encounter: Payer: Self-pay | Admitting: Emergency Medicine

## 2024-01-29 ENCOUNTER — Emergency Department (HOSPITAL_COMMUNITY): Payer: No Typology Code available for payment source

## 2024-01-29 ENCOUNTER — Ambulatory Visit
Admission: EM | Admit: 2024-01-29 | Discharge: 2024-01-29 | Disposition: A | Discharge status: Home / Self Care | Payer: No Typology Code available for payment source | Attending: Physician Assistant | Admitting: Physician Assistant

## 2024-01-29 ENCOUNTER — Encounter (HOSPITAL_COMMUNITY): Payer: Self-pay

## 2024-01-29 ENCOUNTER — Emergency Department (HOSPITAL_COMMUNITY)
Admission: EM | Admit: 2024-01-29 | Discharge: 2024-01-29 | Disposition: A | Discharge status: Home / Self Care | Payer: No Typology Code available for payment source | Attending: Emergency Medicine | Admitting: Emergency Medicine

## 2024-01-29 ENCOUNTER — Other Ambulatory Visit: Payer: Self-pay

## 2024-01-29 DIAGNOSIS — D259 Leiomyoma of uterus, unspecified: Secondary | ICD-10-CM | POA: Diagnosis not present

## 2024-01-29 DIAGNOSIS — Z79899 Other long term (current) drug therapy: Secondary | ICD-10-CM | POA: Diagnosis not present

## 2024-01-29 DIAGNOSIS — R1031 Right lower quadrant pain: Secondary | ICD-10-CM | POA: Diagnosis present

## 2024-01-29 DIAGNOSIS — R748 Abnormal levels of other serum enzymes: Secondary | ICD-10-CM | POA: Diagnosis not present

## 2024-01-29 LAB — CBC WITH DIFFERENTIAL/PLATELET
Abs Immature Granulocytes: 0.01 10*3/uL (ref 0.00–0.07)
Basophils Absolute: 0.1 10*3/uL (ref 0.0–0.1)
Basophils Relative: 1 %
Eosinophils Absolute: 0.5 10*3/uL (ref 0.0–0.5)
Eosinophils Relative: 6 %
HCT: 39.3 % (ref 36.0–46.0)
Hemoglobin: 12.4 g/dL (ref 12.0–15.0)
Immature Granulocytes: 0 %
Lymphocytes Relative: 50 %
Lymphs Abs: 4 10*3/uL (ref 0.7–4.0)
MCH: 27.3 pg (ref 26.0–34.0)
MCHC: 31.6 g/dL (ref 30.0–36.0)
MCV: 86.4 fL (ref 80.0–100.0)
Monocytes Absolute: 0.5 10*3/uL (ref 0.1–1.0)
Monocytes Relative: 6 %
Neutro Abs: 2.9 10*3/uL (ref 1.7–7.7)
Neutrophils Relative %: 37 %
Platelets: 426 10*3/uL — ABNORMAL HIGH (ref 150–400)
RBC: 4.55 MIL/uL (ref 3.87–5.11)
RDW: 14.8 % (ref 11.5–15.5)
WBC: 7.9 10*3/uL (ref 4.0–10.5)
nRBC: 0 % (ref 0.0–0.2)

## 2024-01-29 LAB — COMPREHENSIVE METABOLIC PANEL
ALT: 11 U/L (ref 0–44)
AST: 24 U/L (ref 15–41)
Albumin: 4 g/dL (ref 3.5–5.0)
Alkaline Phosphatase: 42 U/L (ref 38–126)
Anion gap: 11 (ref 5–15)
BUN: 8 mg/dL (ref 6–20)
CO2: 21 mmol/L — ABNORMAL LOW (ref 22–32)
Calcium: 9.2 mg/dL (ref 8.9–10.3)
Chloride: 104 mmol/L (ref 98–111)
Creatinine, Ser: 0.84 mg/dL (ref 0.44–1.00)
GFR, Estimated: >60 mL/min (ref >60)
Glucose, Bld: 100 mg/dL — ABNORMAL HIGH (ref 70–99)
Potassium: 3.7 mmol/L (ref 3.5–5.1)
Sodium: 136 mmol/L (ref 135–145)
Total Bilirubin: 0.7 mg/dL (ref 0.0–1.2)
Total Protein: 7.8 g/dL (ref 6.5–8.1)

## 2024-01-29 LAB — URINALYSIS, ROUTINE W REFLEX MICROSCOPIC
Bilirubin Urine: NEGATIVE
Glucose, UA: NEGATIVE mg/dL
Hgb urine dipstick: NEGATIVE
Ketones, ur: NEGATIVE mg/dL
Leukocytes,Ua: NEGATIVE
Nitrite: NEGATIVE
Protein, ur: NEGATIVE mg/dL
Specific Gravity, Urine: 1.011 (ref 1.005–1.030)
pH: 5 (ref 5.0–8.0)

## 2024-01-29 LAB — POCT URINALYSIS DIP (MANUAL ENTRY)
Bilirubin, UA: NEGATIVE
Blood, UA: NEGATIVE
Glucose, UA: NEGATIVE mg/dL
Ketones, POC UA: NEGATIVE mg/dL
Leukocytes, UA: NEGATIVE
Nitrite, UA: NEGATIVE
Protein Ur, POC: NEGATIVE mg/dL
Spec Grav, UA: >=1.03 — AB (ref 1.010–1.025)
Urobilinogen, UA: 0.2 U/dL
pH, UA: 5.5 (ref 5.0–8.0)

## 2024-01-29 LAB — POCT URINE PREGNANCY: Preg Test, Ur: NEGATIVE

## 2024-01-29 LAB — LIPASE, BLOOD: Lipase: 80 U/L — ABNORMAL HIGH (ref 11–51)

## 2024-01-29 LAB — HCG, SERUM, QUALITATIVE: Preg, Serum: NEGATIVE

## 2024-01-29 MED ORDER — ONDANSETRON 4 MG PO TBDP: ORAL_TABLET | ORAL | 0 refills | Status: DC

## 2024-01-29 MED ORDER — IOHEXOL 300 MG/ML  SOLN
100.0000 mL | Freq: Once | INTRAMUSCULAR | Status: AC | PRN
  Administered 2024-01-29: 100 mL via INTRAVENOUS

## 2024-01-29 MED ORDER — IOHEXOL 300 MG/ML  SOLN
80.0000 mL | Freq: Once | INTRAMUSCULAR | Status: AC | PRN
  Administered 2024-01-29: 80 mL via INTRAVENOUS

## 2024-01-29 NOTE — ED Provider Notes (Signed)
 [facility]    CSN: [id number] Arrival date & time: 01/29/24  0931      History   Chief Complaint Chief Complaint  Patient presents with   Abdominal Pain    HPI [name] is a 48 y.o. female.   Patient presents to urgent care for evaluation of abdominal pain that seems to be focused to the right lower quadrant.  She notes that symptoms have been ongoing for the last week.  She notes at times pain is sharp and stabbing it and other times it is dull and aching.  She has nausea at baseline due to "weak stomach" so she is unsure if this is been related to symptoms.  She does have history of fibroids and endometriosis but states this feels different.  She notes she has stopped taking the terbinafine she was taking for onychomycosis however notes that this has not changed her symptoms.  She does report bloating and excessive gas since pain began.  She has not had any fever but notes some sweats at night.  The history is provided by the patient.  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   Chronic pelvic pain in female 05/07/2016   Fibroid, uterine 05/05/2016   Hypokalemia    HTN (hypertension) 09/02/2013   Asthma exacerbation 09/01/2013   Tobacco abuse 09/01/2013    Past Surgical History:  Procedure Laterality Date   CESAREAN SECTION     LAPAROTOMY N/A 05/05/2016    Procedure: EXPLORATORY LAPAROTOMY WITH ULTRASOUND;  Surgeon: Vernal Gold, MD;  Location: [facility];  Service: Gynecology;  Laterality: N/A;  needs ultrasound ...Aaron AasAaron AasAaron Aaslysis of adhesions    MYOMECTOMY N/A 05/05/2016   Procedure: MYOMECTOMY, Biopsy Right Ovary & Peritoneum;  Surgeon: Vernal Gold, MD;  Location: [facility];  Service: Gynecology;  Laterality: N/A;   TUBAL LIGATION      OB History     Gravida  3   Para  2   Term  1   Preterm  1   AB  1   Living  2      SAB  0   IAB  1   Ectopic  0   Multiple  0   Live Births  2            Home Medications    Prior to Admission medications   Medication Sig Start Date End Date Taking? Authorizing Provider  albuterol  (PROVENTIL ) (5 MG/ML) 0.5% nebulizer solution Take 0.5 mLs (2.5 mg total) by nebulization every 4 (four) hours as needed for wheezing or shortness of breath. 06/16/16  Yes Raymundo Calk, MD  albuterol  (VENTOLIN  HFA) 108 (90 Base) MCG/ACT inhaler INHALE 1 PUFF BY MOUTH AS NEEDED   Yes [provider]  hydrochlorothiazide  (HYDRODIURIL ) 12.5 MG tablet  Take 1 tablet by mouth daily. 08/29/23  Yes [provider]  ibuprofen  (ADVIL ,MOTRIN ) 200 MG tablet Take 400 mg by mouth daily as needed for moderate pain.   Yes [provider]  Oxycodone  HCl 10 MG TABS Take 10 mg by mouth 3 (three) times daily as needed (Pain).   Yes [provider]  Vitamin D , Ergocalciferol , (DRISDOL ) 1.25 MG (50000 UNIT) CAPS capsule Take 1 capsule by mouth once a week. 08/29/23  Yes [provider]  hydrochlorothiazide  (HYDRODIURIL ) 25 MG tablet Take 25 mg by mouth daily.    [provider]  ipratropium-albuterol  (DUONEB) 0.5-2.5 (3) MG/3ML SOLN Inhale into the lungs. Patient not taking: Reported on 01/29/2024 06/15/23   [provider]  norethindrone  (MICRONOR ) 0.35 MG tablet Take 1 tablet (0.35 mg total) by mouth daily. Patient not taking: Reported on 01/29/2024 03/02/23   Ajewole, Christana, MD   Norgestimate-Ethinyl Estradiol Triphasic (TRI-SPRINTEC) 0.18/0.215/0.25 MG-35 MCG tablet Take 1 tablet by mouth daily. Patient not taking: Reported on 01/29/2024    [provider]  nystatin  ointment (MYCOSTATIN ) Apply 1 Application topically 2 (two) times daily. Patient not taking: Reported on 01/29/2024 03/02/23   Ajewole, Christana, MD  Oxycodone  HCl 10 MG TABS Take by mouth.    [provider]    Family History Family History  Problem Relation Age of Onset   Asthma Mother    Asthma Brother    Lymphoma Brother    Breast cancer Neg Hx     Social History Social History   Tobacco Use   Smoking status: Former    Current packs/day: 0.00    Average packs/day: 0.3 packs/day for 20.0 years (5.0 ttl pk-yrs)    Types: Cigarettes    Start date: 04/09/1994    Quit date: 04/09/2014    Years since quitting: 9.8   Smokeless tobacco: Never  Vaping Use   Vaping status: Never Used  Substance Use Topics   Alcohol use: Not Currently    Comment: twice a year   Drug use: Not Currently    Types: Marijuana    Comment: recreational use edible last week 04/18/16     Allergies   Acetaminophen , Hydrocodone -acetaminophen , Vicodin [hydrocodone -acetaminophen ], and Prednisone    Review of Systems Review of Systems  Constitutional:  Negative for chills and fever.  Eyes:  Negative for discharge and redness.  Respiratory:  Negative for shortness of breath.   Gastrointestinal:  Positive for abdominal pain and nausea. Negative for diarrhea and vomiting.     Physical Exam Triage Vital Signs ED Triage Vitals [01/29/24 1045]  Encounter Vitals Group     BP 136/85     Systolic BP Percentile      Diastolic BP Percentile      Pulse Rate 84     Resp 16     Temp 98.3 F (36.8 C)     Temp Source Oral     SpO2 97 %     Weight      Height      Head Circumference      Peak Flow      Pain Score 6     Pain Loc      Pain Education      Exclude from Growth Chart    No data  found.  Updated Vital Signs BP 136/85 (BP Location: Right Arm)   Pulse 84   Temp 98.3 F (36.8 C) (Oral)   Resp 16   LMP 01/10/2024 (Approximate)   SpO2 97%  Visual Acuity Right Eye Distance:   Left Eye Distance:   Bilateral Distance:    Right Eye Near:   Left Eye Near:    Bilateral Near:     Physical Exam Vitals and nursing note reviewed.  Constitutional:      General: She is not in acute distress.    Appearance: Normal appearance. She is not ill-appearing.  HENT:     Head: Normocephalic and atraumatic.  Eyes:     Conjunctiva/sclera: Conjunctivae normal.  Cardiovascular:     Rate and Rhythm: Normal rate and regular rhythm.  Pulmonary:     Effort: Pulmonary effort is normal. No respiratory distress.     Breath sounds: Normal breath sounds. No wheezing, rhonchi or rales.  Abdominal:     General: Abdomen is flat. There is no distension.     Palpations: Abdomen is soft.     Tenderness: There is abdominal tenderness (RLQ TTP). There is no guarding or rebound.  Neurological:     Mental Status: She is alert.  Psychiatric:        Mood and Affect: Mood normal.        Behavior: Behavior normal.        Thought Content: Thought content normal.      UC Treatments / Results  Labs (all labs ordered are listed, but only abnormal results are displayed) Labs Reviewed  POCT URINALYSIS DIP (MANUAL ENTRY) - Abnormal; Notable for the following components:      Result Value   Clarity, UA cloudy (*)    Spec Grav, UA >=1.030 (*)    All other components within normal limits  POCT URINE PREGNANCY - Normal    EKG   Radiology No results found.  Procedures Procedures (including critical care time)  Medications Ordered in UC Medications - No data to display  Initial Impression / Assessment and Plan / UC Course  I have reviewed the triage vital signs and the nursing notes.  Pertinent labs & imaging results that were available during my care of the patient were reviewed by  me and considered in my medical decision making (see chart for details).     Given location of pain recommended further evaluation in the emergency room for stat imaging.  Patient is agreeable to same and will transport via POV.   Final Clinical Impressions(s) / UC Diagnoses   Final diagnoses:  RLQ abdominal pain   Discharge Instructions   None    ED Prescriptions   None    PDMP not reviewed this encounter.   Vernestine Gondola, PA-C 01/29/24 1353

## 2024-01-29 NOTE — Discharge Instructions (Signed)
 Take your pain medicine as needed and follow-up with your OB/GYN doctor

## 2024-01-29 NOTE — ED Notes (Signed)
 Patient is waiting, no needs currently, in "some" pain.

## 2024-01-29 NOTE — ED Provider Triage Note (Signed)
Emergency Medicine Provider Triage Evaluation Note  [name] , a 48 y.o. female  was evaluated in triage.  Pt complains of 1 week of RLQ abdominal pain w/ constipation. Pain has woken her up from sleep many times. Hx of endometriosis, fibroids. LMP 01/10/2024.   Just started terbinafine and began feelings Sx shortly after.    Endorses chills, intermittent R sided flank pain.  Denies fevers, chest pain, shortness of breath, vomiting, diarrhea, dysuria, LE swelling.   Review of Systems  Positive: N/a Negative: N/a  Physical Exam  BP (!) 155/105 (BP Location: Left Arm)   Pulse 96   Temp 99.7 F (37.6 C) (Oral)   Resp 18   Ht 5' (1.524 m)   Wt 82.6 kg   LMP 01/10/2024 (Approximate)   SpO2 100%   BMI 35.54 kg/m  Gen:   Awake, no distress   Resp:  Normal effort  MSK:   Moves extremities without difficulty  Other:    Medical Decision Making  Medically screening exam initiated at 2:59 PM.  Appropriate orders placed.  TEVIS CONGER was informed that the remainder of the evaluation will be completed by another provider, this initial triage assessment does not replace that evaluation, and the importance of remaining in the ED until their evaluation is complete.     Lunette Stands, [place] 01/29/24 [phone]

## 2024-01-29 NOTE — ED Notes (Signed)
 Patient is being discharged from the Urgent Care and sent to the Emergency Department via POV . Per Aisha Ali, PA-C, patient is in need of higher level of care due to need for further evaluation of abdominal pain. Patient is aware and verbalizes understanding of plan of care.  Vitals:   01/29/24 1045  BP: 136/85  Pulse: 84  Resp: 16  Temp: 98.3 F (36.8 C)  SpO2: 97%

## 2024-01-29 NOTE — ED Notes (Signed)
 Patient resting in bed at this time.

## 2024-01-29 NOTE — ED Triage Notes (Signed)
 Patient sent from urgent care for CT of abdomen, concern for appendicitis. Patient has had RLQ pain x1 week, has to sleep on L side d/t, has chills unknown if have fever. Reports increase in bowel movements over past week.

## 2024-01-29 NOTE — ED Provider Notes (Signed)
 [place] EMERGENCY DEPARTMENT AT [facility] Provider Note   CSN: [id number] Arrival date & time: 01/29/24  1416     History  Chief Complaint  Patient presents with   Abdominal Pain    [name] is a 48 y.o. female.  Patient complains of right lower quadrant pain.  Patient has a history of bronchospasm  The history is provided by the patient and medical records. No language interpreter was used.  Abdominal Pain Pain location:  RLQ Pain quality: aching   Pain radiates to:  Does not radiate Pain severity:  Moderate Onset quality:  Sudden Timing:  Constant Progression:  Worsening Chronicity:  New Context: not alcohol use   Associated symptoms: no chest pain, no cough, no diarrhea, no fatigue and no hematuria        Home Medications Prior to Admission medications   Medication Sig Start Date End Date Taking? Authorizing Provider  ondansetron (ZOFRAN-ODT) 4 MG disintegrating tablet 4mg  ODT q4 hours prn nausea/vomit 01/29/24  Yes Bethann Berkshire, MD  albuterol (PROVENTIL) (5 MG/ML) 0.5% nebulizer solution Take 0.5 mLs (2.5 mg total) by nebulization every 4 (four) hours as needed for wheezing or shortness of breath. 06/16/16   Lavera Guise, MD  albuterol (VENTOLIN HFA) 108 (90 Base) MCG/ACT inhaler INHALE 1 PUFF BY MOUTH AS NEEDED    [provider]  hydrochlorothiazide (HYDRODIURIL) 12.5 MG tablet Take 1 tablet by mouth daily. 08/29/23   [provider]  hydrochlorothiazide (HYDRODIURIL) 25 MG tablet Take 25 mg by mouth daily.    [provider]  ibuprofen (ADVIL,MOTRIN) 200 MG tablet Take 400 mg by mouth daily as needed for moderate pain.    [provider]  ipratropium-albuterol (DUONEB) 0.5-2.5 (3) MG/3ML SOLN Inhale into the lungs. Patient not taking: Reported on 01/29/2024 06/15/23   [provider]  norethindrone (MICRONOR) 0.35 MG tablet Take 1 tablet (0.35 mg total) by mouth daily. Patient not taking: Reported  on 01/29/2024 03/02/23   Lorriane Shire, MD  Norgestimate-Ethinyl Estradiol Triphasic (TRI-SPRINTEC) 0.18/0.215/0.25 MG-35 MCG tablet Take 1 tablet by mouth daily. Patient not taking: Reported on 01/29/2024    [provider]  nystatin ointment (MYCOSTATIN) Apply 1 Application topically 2 (two) times daily. Patient not taking: Reported on 01/29/2024 03/02/23   Lorriane Shire, MD  Oxycodone HCl 10 MG TABS Take 10 mg by mouth 3 (three) times daily as needed (Pain).    [provider]  Oxycodone HCl 10 MG TABS Take by mouth.    [provider]  Vitamin D, Ergocalciferol, (DRISDOL) 1.25 MG (50000 UNIT) CAPS capsule Take 1 capsule by mouth once a week. 08/29/23   [provider]      Allergies    Acetaminophen, Hydrocodone-acetaminophen, Vicodin [hydrocodone-acetaminophen], and Prednisone    Review of Systems   Review of Systems  Constitutional:  Negative for appetite change and fatigue.  HENT:  Negative for congestion, ear discharge and sinus pressure.   Eyes:  Negative for discharge.  Respiratory:  Negative for cough.   Cardiovascular:  Negative for chest pain.  Gastrointestinal:  Positive for abdominal pain. Negative for diarrhea.  Genitourinary:  Negative for frequency and hematuria.  Musculoskeletal:  Negative for back pain.  Skin:  Negative for rash.  Neurological:  Negative for seizures and headaches.  Psychiatric/Behavioral:  Negative for hallucinations.     Physical Exam Updated Vital Signs BP 139/88 (BP Location: Left Arm)   Pulse 72   Temp 98.6 F (37 C) (Oral)  Resp 18   Ht 5' (1.524 m)   Wt 82.6 kg   LMP 01/10/2024 (Approximate) Comment: negative HCG blood test 01-29-2024  SpO2 100%   BMI 35.54 kg/m  Physical Exam Vitals and nursing note reviewed.  Constitutional:      Appearance: She is well-developed.  HENT:     Head: Normocephalic.     Nose: Nose normal.  Eyes:     General: No scleral icterus.    Conjunctiva/sclera:  Conjunctivae normal.  Neck:     Thyroid: No thyromegaly.  Cardiovascular:     Rate and Rhythm: Normal rate and regular rhythm.     Heart sounds: No murmur heard.    No friction rub. No gallop.  Pulmonary:     Breath sounds: No stridor. No wheezing or rales.  Chest:     Chest wall: No tenderness.  Abdominal:     General: There is no distension.     Tenderness: There is abdominal tenderness. There is no rebound.     Comments: Tender right lower quadrant  Musculoskeletal:        General: Normal range of motion.     Cervical back: Neck supple.  Lymphadenopathy:     Cervical: No cervical adenopathy.  Skin:    Findings: No erythema or rash.  Neurological:     Mental Status: She is alert and oriented to person, place, and time.     Motor: No abnormal muscle tone.     Coordination: Coordination normal.  Psychiatric:        Behavior: Behavior normal.     ED Results / Procedures / Treatments   Labs (all labs ordered are listed, but only abnormal results are displayed) Labs Reviewed  CBC WITH DIFFERENTIAL/PLATELET - Abnormal; Notable for the following components:      Result Value   Platelets 426 (*)    All other components within normal limits  COMPREHENSIVE METABOLIC PANEL - Abnormal; Notable for the following components:   CO2 21 (*)    Glucose, Bld 100 (*)    All other components within normal limits  LIPASE, BLOOD - Abnormal; Notable for the following components:   Lipase 80 (*)    All other components within normal limits  URINALYSIS, ROUTINE W REFLEX MICROSCOPIC - Abnormal; Notable for the following components:   Color, Urine STRAW (*)    All other components within normal limits  HCG, SERUM, QUALITATIVE    EKG None  Radiology US PELVIC COMPLETE W TRANSVAGINAL AND TORSION R/O Result Date: 01/29/2024 CLINICAL DATA:  Abdominal pain x1 week. EXAM: TRANSABDOMINAL AND TRANSVAGINAL ULTRASOUND OF PELVIS DOPPLER ULTRASOUND OF OVARIES TECHNIQUE: Both transabdominal and  transvaginal ultrasound examinations of the pelvis were performed. Transabdominal technique was performed for global imaging of the pelvis including uterus, ovaries, adnexal regions, and pelvic cul-de-sac. It was necessary to proceed with endovaginal exam following the transabdominal exam to visualize the uterus, endometrium, bilateral ovaries and bilateral adnexa. Color and duplex Doppler ultrasound was utilized to evaluate blood flow to the ovaries. COMPARISON:  None Available. FINDINGS: Uterus Measurements: 10.5 cm x 6.9 cm x 8.0 cm = volume: 302.5 mL. Several large, heterogeneous uterine fibroids are noted. The largest measures approximately 4.2 cm x 3.8 cm x 3.9 cm. Endometrium Thickness: 8.6.  No focal abnormality visualized. Right ovary Measurements: 4.0 cm x 2.5 cm x 2.2 cm = volume: 14.6 mL. Normal appearance/no adnexal mass. Left ovary The left ovary is not visualized. Pulsed Doppler evaluation of the RIGHT ovary demonstrates normal low-resistance  arterial and venous waveforms. Other findings No abnormal free fluid. IMPRESSION: 1. Multiple large, heterogeneous uterine fibroids. 2. Nonvisualization of the LEFT ovary. Electronically Signed   By: Aram Candela M.D.   On: 01/29/2024 22:46   CT ABDOMEN PELVIS W CONTRAST Result Date: 01/29/2024 CLINICAL DATA:  Right lower quadrant pain. EXAM: CT ABDOMEN AND PELVIS WITH CONTRAST TECHNIQUE: Multidetector CT imaging of the abdomen and pelvis was performed using the standard protocol following bolus administration of intravenous contrast. RADIATION DOSE REDUCTION: This exam was performed according to the departmental dose-optimization program which includes automated exposure control, adjustment of the mA and/or kV according to patient size and/or use of iterative reconstruction technique. CONTRAST:  OMNIPAQUE IOHEXOL 300 MG/ML SOLN, 80mL OMNIPAQUE IOHEXOL 300 MG/ML SOLN COMPARISON:  CT 12/18/2014 abdomen pelvis FINDINGS: Lower chest: Semi-solid nodule  left lower lobe measures 6 mm on series 6, image 13. There is a nodule in this location previously measuring 6 mm. Long term stability. Small hiatal hernia. Hepatobiliary: Fatty liver infiltration. Gallbladder is contracted. Patent portal vein. Tiny cystic focus seen in segment 4B on series 2, image 29, too small to characterize although statistically likely a benign cystic lesion. No specific imaging follow-up. Pancreas: Unremarkable. No pancreatic ductal dilatation or surrounding inflammatory changes. Spleen: Normal in size without focal abnormality. Adrenals/Urinary Tract: Adrenal glands are preserved. No enhancing renal mass or collecting system dilatation. There is some contrast in the renal collecting systems. Please correlate with any previous contrast administration or other process. Preserved contour to the urinary bladder. Stomach/Bowel: There is some fluid in the stomach. Small bowel is nondilated. Large bowel is normal course and caliber with scattered colonic stool. Few left-sided colonic diverticula. Normal appendix. Vascular/Lymphatic: Normal caliber aorta and IVC. No specific abnormal lymph node enlargement identified in the abdomen and pelvis. Few retroperitoneal nodes are not pathologic by size criteria and unchanged from previous. Reproductive: Multilobular uterus consistent with multiple fibroids. No separate adnexal mass. Other: No free air or free fluid. Musculoskeletal: No acute or significant osseous findings. IMPRESSION: Scattered colonic stool. No bowel obstruction. Normal appendix. Minimal diverticula. Small hiatal hernia. Fatty liver infiltration. There is contrast in the renal collecting systems. Please correlate for any previous contrast administration or other process. Electronically Signed   By: Karen Kays M.D.   On: 01/29/2024 18:56    Procedures Procedures    Medications Ordered in ED Medications  iohexol (OMNIPAQUE) 300 MG/ML solution 100 mL (100 mLs Intravenous Contrast  Given 01/29/24 1658)  iohexol (OMNIPAQUE) 300 MG/ML solution 80 mL (80 mLs Intravenous Contrast Given 01/29/24 1719)    ED Course/ Medical Decision Making/ A&P                                 Medical Decision Making Amount and/or Complexity of Data Reviewed Radiology: ordered.  Risk Prescription drug management.  This patient presents to the ED for concern of abdominal pain, this involves an extensive number of treatment options, and is a complaint that carries with it a high risk of complications and morbidity.  The differential diagnosis includes appendicitis, colon cancer   Co morbidities that complicate the patient evaluation  Bronchospasm   Additional history obtained:  Additional history obtained from patient External records from outside source obtained and reviewed including hospital records   Lab Tests:  I Ordered, and personally interpreted labs.  The pertinent results include: Lipase mildly elevated at 80   Imaging Studies ordered:  I ordered imaging studies including CT abdomen and pelvic ultrasound I independently visualized and interpreted imaging which showed uterine fibroids I agree with the radiologist interpretation   Cardiac Monitoring: / EKG:  The patient was maintained on a cardiac monitor.  I personally viewed and interpreted the cardiac monitored which showed an underlying rhythm of: Normal sinus rhythm   Consultations Obtained:  No consultant Problem List / ED Course / Critical interventions / Medication management  Abdominal pain, uterine fibroids, bronchospasm I ordered medication including Dilaudid for pain Reevaluation of the patient after these medicines showed that the patient improved I have reviewed the patients home medicines and have made adjustments as needed   Social Determinants of Health:  None   Test / Admission - Considered:  None  Patient with uterine fibroids.  She will follow-up with her OB/GYN  doctor        Final Clinical Impression(s) / ED Diagnoses Final diagnoses:  Uterine leiomyoma, unspecified location    Rx / DC Orders ED Discharge Orders          Ordered    ondansetron (ZOFRAN-ODT) 4 MG disintegrating tablet        01/29/24 2317              Bethann Berkshire, MD 01/31/24 1338

## 2024-01-29 NOTE — ED Triage Notes (Signed)
 Pt reports abdominal pain focused in RLQ x1 week. Notes pain changes from sharp, stabbing to dull, aching. Pt has hx of fibroids and endometriosis but reports this feels different from past pain. Pt reports recent use of terbenafine for toe fungus that she states she stopped taking 3 days ago as she knows this could cause abdominal pain. Pt reports bloating and excessive gas since pain started. Denies fevers.

## 2024-01-31 ENCOUNTER — Ambulatory Visit
Admission: RE | Admit: 2024-01-31 | Discharge: 2024-01-31 | Disposition: A | Discharge status: Home / Self Care | Payer: No Typology Code available for payment source | Source: Ambulatory Visit | Attending: Physician Assistant | Admitting: Physician Assistant

## 2024-01-31 DIAGNOSIS — Z1231 Encounter for screening mammogram for malignant neoplasm of breast: Secondary | ICD-10-CM

## 2024-02-20 ENCOUNTER — Other Ambulatory Visit: Payer: Self-pay

## 2024-02-20 ENCOUNTER — Inpatient Hospital Stay (HOSPITAL_COMMUNITY)
Admission: EM | Admit: 2024-02-20 | Discharge: 2024-02-23 | DRG: 202 | Disposition: A | Discharge status: Home / Self Care | Attending: Family Medicine | Admitting: Family Medicine

## 2024-02-20 DIAGNOSIS — E876 Hypokalemia: Secondary | ICD-10-CM | POA: Diagnosis present

## 2024-02-20 DIAGNOSIS — G894 Chronic pain syndrome: Secondary | ICD-10-CM | POA: Diagnosis present

## 2024-02-20 DIAGNOSIS — Z720 Tobacco use: Secondary | ICD-10-CM | POA: Diagnosis present

## 2024-02-20 DIAGNOSIS — K219 Gastro-esophageal reflux disease without esophagitis: Secondary | ICD-10-CM | POA: Diagnosis present

## 2024-02-20 DIAGNOSIS — Z8616 Personal history of COVID-19: Secondary | ICD-10-CM

## 2024-02-20 DIAGNOSIS — J4551 Severe persistent asthma with (acute) exacerbation: Principal | ICD-10-CM | POA: Diagnosis present

## 2024-02-20 DIAGNOSIS — Z79899 Other long term (current) drug therapy: Secondary | ICD-10-CM

## 2024-02-20 DIAGNOSIS — E871 Hypo-osmolality and hyponatremia: Secondary | ICD-10-CM | POA: Diagnosis present

## 2024-02-20 DIAGNOSIS — Z885 Allergy status to narcotic agent status: Secondary | ICD-10-CM

## 2024-02-20 DIAGNOSIS — J45901 Unspecified asthma with (acute) exacerbation: Secondary | ICD-10-CM | POA: Diagnosis present

## 2024-02-20 DIAGNOSIS — Z888 Allergy status to other drugs, medicaments and biological substances status: Secondary | ICD-10-CM

## 2024-02-20 DIAGNOSIS — E66812 Obesity, class 2: Secondary | ICD-10-CM | POA: Diagnosis present

## 2024-02-20 DIAGNOSIS — R000 Tachycardia, unspecified: Secondary | ICD-10-CM | POA: Diagnosis present

## 2024-02-20 DIAGNOSIS — I10 Essential (primary) hypertension: Secondary | ICD-10-CM | POA: Diagnosis present

## 2024-02-20 DIAGNOSIS — Z1152 Encounter for screening for COVID-19: Secondary | ICD-10-CM

## 2024-02-20 DIAGNOSIS — Z6835 Body mass index (BMI) 35.0-35.9, adult: Secondary | ICD-10-CM

## 2024-02-20 DIAGNOSIS — Z825 Family history of asthma and other chronic lower respiratory diseases: Secondary | ICD-10-CM

## 2024-02-20 DIAGNOSIS — F319 Bipolar disorder, unspecified: Secondary | ICD-10-CM | POA: Diagnosis present

## 2024-02-20 MED ORDER — MAGNESIUM SULFATE 2 GM/50ML IV SOLN
2.0000 g | Freq: Once | INTRAVENOUS | Status: AC
  Administered 2024-02-21: 2 g via INTRAVENOUS
  Filled 2024-02-20: qty 50

## 2024-02-20 MED ORDER — IPRATROPIUM-ALBUTEROL 0.5-2.5 (3) MG/3ML IN SOLN
9.0000 mL | Freq: Once | RESPIRATORY_TRACT | Status: AC
  Administered 2024-02-21: 9 mL via RESPIRATORY_TRACT
  Filled 2024-02-20: qty 3

## 2024-02-20 MED ORDER — METHYLPREDNISOLONE SODIUM SUCC 125 MG IJ SOLR
125.0000 mg | Freq: Once | INTRAMUSCULAR | Status: AC
  Administered 2024-02-21: 125 mg via INTRAVENOUS
  Filled 2024-02-20: qty 2

## 2024-02-20 NOTE — ED Triage Notes (Addendum)
 Patient c/o SOB x1 day. Patient report worsening SOB and chest tightness tonight. Patient report taking inhaler without relief.  Patient denies N/V.

## 2024-02-21 ENCOUNTER — Emergency Department (HOSPITAL_COMMUNITY)

## 2024-02-21 ENCOUNTER — Encounter (HOSPITAL_COMMUNITY): Payer: Self-pay | Admitting: Internal Medicine

## 2024-02-21 DIAGNOSIS — I10 Essential (primary) hypertension: Secondary | ICD-10-CM | POA: Diagnosis not present

## 2024-02-21 DIAGNOSIS — E876 Hypokalemia: Secondary | ICD-10-CM | POA: Diagnosis not present

## 2024-02-21 DIAGNOSIS — J45901 Unspecified asthma with (acute) exacerbation: Secondary | ICD-10-CM | POA: Diagnosis not present

## 2024-02-21 DIAGNOSIS — R000 Tachycardia, unspecified: Secondary | ICD-10-CM | POA: Diagnosis present

## 2024-02-21 DIAGNOSIS — Z720 Tobacco use: Secondary | ICD-10-CM

## 2024-02-21 LAB — CBC
HCT: 33.5 % — ABNORMAL LOW (ref 36.0–46.0)
HCT: 35.4 % — ABNORMAL LOW (ref 36.0–46.0)
Hemoglobin: 10.7 g/dL — ABNORMAL LOW (ref 12.0–15.0)
Hemoglobin: 11.1 g/dL — ABNORMAL LOW (ref 12.0–15.0)
MCH: 26.9 pg (ref 26.0–34.0)
MCH: 27.3 pg (ref 26.0–34.0)
MCHC: 31.4 g/dL (ref 30.0–36.0)
MCHC: 31.9 g/dL (ref 30.0–36.0)
MCV: 85.5 fL (ref 80.0–100.0)
MCV: 85.9 fL (ref 80.0–100.0)
Platelets: 426 10*3/uL — ABNORMAL HIGH (ref 150–400)
Platelets: 460 10*3/uL — ABNORMAL HIGH (ref 150–400)
RBC: 3.92 MIL/uL (ref 3.87–5.11)
RBC: 4.12 MIL/uL (ref 3.87–5.11)
RDW: 14.8 % (ref 11.5–15.5)
RDW: 15.1 % (ref 11.5–15.5)
WBC: 10.1 10*3/uL (ref 4.0–10.5)
WBC: 7.6 10*3/uL (ref 4.0–10.5)
nRBC: 0 % (ref 0.0–0.2)
nRBC: 0 % (ref 0.0–0.2)

## 2024-02-21 LAB — BASIC METABOLIC PANEL
Anion gap: 12 (ref 5–15)
BUN: 8 mg/dL (ref 6–20)
CO2: 17 mmol/L — ABNORMAL LOW (ref 22–32)
Calcium: 9 mg/dL (ref 8.9–10.3)
Chloride: 104 mmol/L (ref 98–111)
Creatinine, Ser: 0.88 mg/dL (ref 0.44–1.00)
GFR, Estimated: >60 mL/min (ref >60)
Glucose, Bld: 106 mg/dL — ABNORMAL HIGH (ref 70–99)
Potassium: 3.3 mmol/L — ABNORMAL LOW (ref 3.5–5.1)
Sodium: 133 mmol/L — ABNORMAL LOW (ref 135–145)

## 2024-02-21 LAB — RESP PANEL BY RT-PCR (RSV, FLU A&B, COVID)  RVPGX2
Influenza A by PCR: NEGATIVE
Influenza B by PCR: NEGATIVE
Resp Syncytial Virus by PCR: NEGATIVE
SARS Coronavirus 2 by RT PCR: NEGATIVE

## 2024-02-21 LAB — MAGNESIUM: Magnesium: 1.8 mg/dL (ref 1.7–2.4)

## 2024-02-21 LAB — TROPONIN I (HIGH SENSITIVITY)
Troponin I (High Sensitivity): 2 ng/L (ref <18)
Troponin I (High Sensitivity): 3 ng/L (ref <18)

## 2024-02-21 LAB — D-DIMER, QUANTITATIVE: D-Dimer, Quant: 0.34 ug{FEU}/mL (ref 0.00–0.50)

## 2024-02-21 LAB — HIV ANTIBODY (ROUTINE TESTING W REFLEX): HIV Screen 4th Generation wRfx: NONREACTIVE

## 2024-02-21 LAB — PHOSPHORUS: Phosphorus: 1.7 mg/dL — ABNORMAL LOW (ref 2.5–4.6)

## 2024-02-21 LAB — CREATININE, SERUM
Creatinine, Ser: 0.99 mg/dL (ref 0.44–1.00)
GFR, Estimated: >60 mL/min (ref >60)

## 2024-02-21 MED ORDER — IPRATROPIUM-ALBUTEROL 0.5-2.5 (3) MG/3ML IN SOLN
3.0000 mL | Freq: Four times a day (QID) | RESPIRATORY_TRACT | Status: DC
  Administered 2024-02-21 – 2024-02-23 (×7): 3 mL via RESPIRATORY_TRACT
  Filled 2024-02-21 (×7): qty 3

## 2024-02-21 MED ORDER — OXYCODONE HCL 5 MG PO TABS: 10.0000 mg | ORAL_TABLET | Freq: Three times a day (TID) | ORAL | Status: DC | PRN

## 2024-02-21 MED ORDER — ALBUTEROL SULFATE (2.5 MG/3ML) 0.083% IN NEBU
2.5000 mg | INHALATION_SOLUTION | RESPIRATORY_TRACT | Status: DC | PRN
Start: 2024-02-21 — End: 2024-02-21

## 2024-02-21 MED ORDER — ALBUTEROL SULFATE (2.5 MG/3ML) 0.083% IN NEBU
10.0000 mg/h | INHALATION_SOLUTION | Freq: Once | RESPIRATORY_TRACT | Status: AC
  Administered 2024-02-21: 10 mg/h via RESPIRATORY_TRACT
  Filled 2024-02-21: qty 12

## 2024-02-21 MED ORDER — HYDRALAZINE HCL 20 MG/ML IJ SOLN
10.0000 mg | Freq: Three times a day (TID) | INTRAMUSCULAR | Status: DC | PRN
  Administered 2024-02-21: 10 mg via INTRAVENOUS
  Filled 2024-02-21: qty 1

## 2024-02-21 MED ORDER — IPRATROPIUM-ALBUTEROL 0.5-2.5 (3) MG/3ML IN SOLN
3.0000 mL | Freq: Four times a day (QID) | RESPIRATORY_TRACT | Status: DC
  Administered 2024-02-21 (×2): 3 mL via RESPIRATORY_TRACT
  Filled 2024-02-21 (×2): qty 3

## 2024-02-21 MED ORDER — ONDANSETRON HCL 4 MG PO TABS: 4.0000 mg | ORAL_TABLET | Freq: Four times a day (QID) | ORAL | Status: DC | PRN

## 2024-02-21 MED ORDER — SODIUM CHLORIDE 0.9 % IV BOLUS
1000.0000 mL | Freq: Once | INTRAVENOUS | Status: AC
  Administered 2024-02-21: 1000 mL via INTRAVENOUS

## 2024-02-21 MED ORDER — VITAMIN D (ERGOCALCIFEROL) 1.25 MG (50000 UNIT) PO CAPS
50000.0000 [IU] | ORAL_CAPSULE | ORAL | Status: DC
  Administered 2024-02-22: 50000 [IU] via ORAL
  Filled 2024-02-21: qty 1

## 2024-02-21 MED ORDER — IPRATROPIUM-ALBUTEROL 0.5-2.5 (3) MG/3ML IN SOLN: 3.0000 mL | Freq: Three times a day (TID) | RESPIRATORY_TRACT | Status: DC

## 2024-02-21 MED ORDER — ENOXAPARIN SODIUM 40 MG/0.4ML IJ SOSY
40.0000 mg | PREFILLED_SYRINGE | INTRAMUSCULAR | Status: DC
Start: 2024-02-21 — End: 2024-02-23
  Administered 2024-02-21 – 2024-02-22 (×2): 40 mg via SUBCUTANEOUS
  Filled 2024-02-21 (×2): qty 0.4

## 2024-02-21 MED ORDER — GUAIFENESIN-DM 100-10 MG/5ML PO SYRP
5.0000 mL | ORAL_SOLUTION | Freq: Four times a day (QID) | ORAL | Status: DC | PRN
  Administered 2024-02-21 – 2024-02-22 (×5): 5 mL via ORAL
  Filled 2024-02-21 (×5): qty 10

## 2024-02-21 MED ORDER — ARFORMOTEROL TARTRATE 15 MCG/2ML IN NEBU
15.0000 ug | INHALATION_SOLUTION | Freq: Two times a day (BID) | RESPIRATORY_TRACT | Status: DC
  Administered 2024-02-21 – 2024-02-23 (×5): 15 ug via RESPIRATORY_TRACT
  Filled 2024-02-21 (×4): qty 2

## 2024-02-21 MED ORDER — BUDESONIDE 0.5 MG/2ML IN SUSP
0.5000 mg | Freq: Two times a day (BID) | RESPIRATORY_TRACT | Status: DC
  Administered 2024-02-21 – 2024-02-23 (×5): 0.5 mg via RESPIRATORY_TRACT
  Filled 2024-02-21 (×5): qty 2

## 2024-02-21 MED ORDER — METHYLPREDNISOLONE SODIUM SUCC 40 MG IJ SOLR
40.0000 mg | Freq: Two times a day (BID) | INTRAMUSCULAR | Status: DC
  Administered 2024-02-21 – 2024-02-23 (×5): 40 mg via INTRAVENOUS
  Filled 2024-02-21 (×5): qty 1

## 2024-02-21 MED ORDER — FUROSEMIDE 10 MG/ML IJ SOLN
20.0000 mg | Freq: Once | INTRAMUSCULAR | Status: AC
  Administered 2024-02-21: 20 mg via INTRAVENOUS
  Filled 2024-02-21: qty 2

## 2024-02-21 MED ORDER — POTASSIUM CHLORIDE CRYS ER 20 MEQ PO TBCR
40.0000 meq | EXTENDED_RELEASE_TABLET | Freq: Once | ORAL | Status: AC
  Administered 2024-02-21: 40 meq via ORAL
  Filled 2024-02-21: qty 2

## 2024-02-21 MED ORDER — LEVALBUTEROL HCL 0.63 MG/3ML IN NEBU
0.6300 mg | INHALATION_SOLUTION | RESPIRATORY_TRACT | Status: DC | PRN
  Administered 2024-02-21: 0.63 mg via RESPIRATORY_TRACT
  Filled 2024-02-21: qty 3

## 2024-02-21 MED ORDER — SODIUM CHLORIDE 0.9 % IV SOLN: 250.0000 mL | INTRAVENOUS | Status: AC | PRN

## 2024-02-21 MED ORDER — PANTOPRAZOLE SODIUM 40 MG PO TBEC
40.0000 mg | DELAYED_RELEASE_TABLET | Freq: Every day | ORAL | Status: DC
  Administered 2024-02-21 – 2024-02-23 (×3): 40 mg via ORAL
  Filled 2024-02-21 (×3): qty 1

## 2024-02-21 MED ORDER — ONDANSETRON HCL 4 MG/2ML IJ SOLN
4.0000 mg | Freq: Four times a day (QID) | INTRAMUSCULAR | Status: DC | PRN
  Administered 2024-02-21: 4 mg via INTRAVENOUS
  Filled 2024-02-21 (×2): qty 2

## 2024-02-21 MED ORDER — SODIUM CHLORIDE 0.9% FLUSH
3.0000 mL | Freq: Two times a day (BID) | INTRAVENOUS | Status: DC
  Administered 2024-02-21 – 2024-02-23 (×5): 3 mL via INTRAVENOUS

## 2024-02-21 MED ORDER — SODIUM CHLORIDE 0.9% FLUSH: 3.0000 mL | INTRAVENOUS | Status: DC | PRN

## 2024-02-21 MED ORDER — OXYCODONE HCL 5 MG PO TABS
10.0000 mg | ORAL_TABLET | ORAL | Status: DC | PRN
  Administered 2024-02-21 – 2024-02-22 (×3): 10 mg via ORAL
  Filled 2024-02-21 (×3): qty 2

## 2024-02-21 NOTE — ED Provider Notes (Signed)
 [place] EMERGENCY DEPARTMENT AT [facility] Provider Note   CSN: [id number] Arrival date & time: 02/20/24  2336     History  Chief Complaint  Patient presents with   Shortness of Breath    [name] is a 48 y.o. female history of asthma presented for an asthma exacerbation that began this morning she woke up.  Patient states that she has not been around any smoke but states she did visit her sister in the cancer center over in [place] and thinks she may have been exposed to some viral illness.  Patient [place] any fevers states she does have chest tightness.  Patient states she is coughing up yellow sputum.  Patient states that she is having asthma exacerbation that her albuterol inhaler and nebulizer at home are not working.  Home Medications Prior to Admission medications   Medication Sig Start Date End Date Taking? Authorizing Provider  albuterol (PROVENTIL) (5 MG/ML) 0.5% nebulizer solution Take 0.5 mLs (2.5 mg total) by nebulization every 4 (four) hours as needed for wheezing or shortness of breath. Patient not taking: Reported on 02/21/2024 06/16/16   Lavera Guise, MD  albuterol (VENTOLIN HFA) 108 (90 Base) MCG/ACT inhaler Inhale 2 puffs into the lungs every 6 (six) hours as needed for wheezing or shortness of breath.   Yes [provider]  hydrochlorothiazide (HYDRODIURIL) 12.5 MG tablet Take 1 tablet by mouth daily. 08/29/23  Yes [provider]  ipratropium-albuterol (DUONEB) 0.5-2.5 (3) MG/3ML SOLN Inhale 3 mLs into the lungs every 6 (six) hours as needed. 06/15/23  Yes [provider]  Oxycodone HCl 10 MG TABS Take 10 mg by mouth 3 (three) times daily as needed (Pain).   Yes [provider]  Vitamin D, Ergocalciferol, (DRISDOL) 1.25 MG (50000 UNIT) CAPS capsule Take 1 capsule by mouth once a week. 08/29/23  Yes [provider]  ondansetron (ZOFRAN-ODT) 4 MG disintegrating tablet 4mg  ODT q4 hours prn  nausea/vomit Patient not taking: Reported on 02/21/2024 01/29/24   Bethann Berkshire, MD      Allergies    Acetaminophen, Hydrocodone-acetaminophen, Vicodin [hydrocodone-acetaminophen], and Prednisone    Review of Systems   Review of Systems  Respiratory:  Positive for shortness of breath.     Physical Exam Updated Vital Signs BP (!) 144/95   Pulse 97   Temp 98.4 F (36.9 C) (Oral)   Resp 15   LMP 01/10/2024 (Approximate) Comment: negative HCG blood test 01-29-2024  SpO2 100%  Physical Exam Constitutional:      General: She is in acute distress.  Cardiovascular:     Rate and Rhythm: Regular rhythm. Tachycardia present.     Pulses: Normal pulses.     Heart sounds: Normal heart sounds.  Pulmonary:     Effort: Respiratory distress present.     Comments: Only able to speak in short sentences Wheezing noted bilaterally Musculoskeletal:     Right lower leg: No edema.     Left lower leg: No edema.  Skin:    General: Skin is warm and dry.  Neurological:     Mental Status: She is alert.  Psychiatric:        Mood and Affect: Mood normal.     ED Results / Procedures / Treatments   Labs (all labs ordered are listed, but only abnormal results are displayed) Labs Reviewed  BASIC METABOLIC PANEL - Abnormal; Notable for the following components:      Result Value   Sodium 133 (*)  Potassium 3.3 (*)    CO2 17 (*)    Glucose, Bld 106 (*)    All other components within normal limits  CBC - Abnormal; Notable for the following components:   Hemoglobin 11.1 (*)    HCT 35.4 (*)    Platelets 460 (*)    All other components within normal limits  RESP PANEL BY RT-PCR (RSV, FLU A&B, COVID)  [id number]  D-DIMER, QUANTITATIVE  TROPONIN I (HIGH SENSITIVITY)  TROPONIN I (HIGH SENSITIVITY)    EKG EKG Interpretation Date/Time:  Tuesday February 20 2024 23:47:51 EST Ventricular Rate:  124 PR Interval:  139 QRS Duration:  93 QT Interval:  308 QTC Calculation: 443 R Axis:   56  Text  Interpretation: Sinus tachycardia Abnormal R-wave progression, early transition Rate faster Confirmed by Glynn Octave [phone]) on 02/21/2024 2:41:09 AM  Radiology DG Chest 2 View Result Date: 02/21/2024 CLINICAL DATA:  Shortness of breath x1 day. EXAM: CHEST - 2 VIEW COMPARISON:  January 02, 2021 FINDINGS: The heart size and mediastinal contours are within normal limits. Both lungs are clear. The visualized skeletal structures are unremarkable. IMPRESSION: No active cardiopulmonary disease. Electronically Signed   By: Aram Candela M.D.   On: 02/21/2024 01:32    Procedures .Critical Care  Performed by: Netta Corrigan, PA-C Authorized by: Netta Corrigan, PA-C   Critical care provider statement:    Critical care time (minutes):  30   Critical care time was exclusive of:  Separately billable procedures and treating other patients   Critical care was necessary to treat or prevent imminent or life-threatening deterioration of the following conditions:  Respiratory failure   Critical care was time spent personally by me on the following activities:  Blood draw for specimens, discussions with consultants, development of treatment plan with patient or surrogate, evaluation of patient's response to treatment, examination of patient, obtaining history from patient or surrogate, review of old charts, re-evaluation of patient's condition, pulse oximetry, ordering and review of radiographic studies, ordering and review of laboratory studies and ordering and performing treatments and interventions   I assumed direction of critical care for this patient from another provider in my specialty: no     Care discussed with: admitting provider       Medications Ordered in ED Medications  ipratropium-albuterol (DUONEB) 0.5-2.5 (3) MG/3ML nebulizer solution 9 mL (9 mLs Nebulization Given 02/21/24 0037)  methylPREDNISolone sodium succinate (SOLU-MEDROL) 125 mg/2 mL injection 125 mg (125 mg Intravenous Given  02/21/24 0046)  magnesium sulfate IVPB 2 g 50 mL (0 g Intravenous Stopped 02/21/24 0212)  sodium chloride 0.9 % bolus 1,000 mL (1,000 mLs Intravenous Bolus 02/21/24 0045)  albuterol (PROVENTIL) (2.5 MG/3ML) 0.083% nebulizer solution (10 mg/hr Nebulization Given 02/21/24 0253)    ED Course/ Medical Decision Making/ A&P                                 Medical Decision Making Amount and/or Complexity of Data Reviewed Labs: ordered. Radiology: ordered.  Risk Prescription drug management. Decision regarding hospitalization.   Arlisha D Puzio 48 y.o. presented today for shortness of breath.  Working DDx that I considered at this time includes, but not limited to, asthma/COPD exacerbation, URI, viral illness, anemia, ACS, PE, pneumonia, pleural effusion, lung cancer, CHF, respiratory distress, medication side effect, intoxication.  R/o DDx: COPD exacerbation, URI, viral illness, anemia, ACS, PE, pneumonia, pleural effusion, lung cancer, CHF, respiratory distress, medication side  effect, intoxication: These are considered less likely due to history of present illness, physical exam, labs/imaging findings  Review of prior external notes: 01/29/2024 ED  Unique Tests and My Independent Interpretation:  CBC: Unremarkable BMP: Mild hypokalemia 3.3 EKG: Sinus 124 bpm, no ST elevations or depressions, no signs of right heart strain Troponin: 2, 3 CXR: No acute findings Respiratory Panel: Negative  Social Determinants of Health: none  Discussion with Independent Historian:  Husband  Discussion of Management of Tests:  [place], MD Hospitalist  Risk: High: hospitalization or escalation of hospital-level care  Risk Stratification Score: None  Plan: On exam patient was in acute distress and tachycardic. Physical exam showed wheezing bilaterally. The cardiac monitor was ordered secondary to the patient's history of shortness of breath and to monitor the patient for dysrhythmia. Cardiac monitor by  my independent interpretation showed normal sinus.  Labs will be ordered along with breathing treatment steroids along magnesium and fluids to see if this can help.  Labs came back reassuring.  D-dimer was negative.  Patient is still having wheezing bilaterally and appears to have increased work of breathing.  Patient is required admission in the past for asthma and I spoke to the patient and patient states that she believes she will need to be admitted and given her presentation do not think this is unreasonable.  I spoke to the hospitalist and patient was accepted for admission.  Patient stable to be admitted.  Will order continuous albuterol treatment.  This chart was dictated using voice recognition software.  Despite best efforts to proofread,  errors can occur which can change the documentation meaning.         Final Clinical Impression(s) / ED Diagnoses Final diagnoses:  Severe persistent asthma with acute exacerbation    Rx / DC Orders ED Discharge Orders     None         Remi Deter 02/21/24 0455    Glynn Octave, MD 02/21/24 [phone]

## 2024-02-21 NOTE — ED Provider Notes (Incomplete)
 [place] EMERGENCY DEPARTMENT AT [facility] Provider Note   CSN: [id number] Arrival date & time: 02/20/24  2336     History {Add pertinent medical, surgical, social history, OB history to HPI:1} Chief Complaint  Patient presents with  . Shortness of Breath    [name] is a 48 y.o. female.   Shortness of Breath      Home Medications Prior to Admission medications   Medication Sig Start Date End Date Taking? Authorizing Provider  albuterol (PROVENTIL) (5 MG/ML) 0.5% nebulizer solution Take 0.5 mLs (2.5 mg total) by nebulization every 4 (four) hours as needed for wheezing or shortness of breath. 06/16/16   Lavera Guise, MD  albuterol (VENTOLIN HFA) 108 (90 Base) MCG/ACT inhaler INHALE 1 PUFF BY MOUTH AS NEEDED    [provider]  hydrochlorothiazide (HYDRODIURIL) 12.5 MG tablet Take 1 tablet by mouth daily. 08/29/23   [provider]  hydrochlorothiazide (HYDRODIURIL) 25 MG tablet Take 25 mg by mouth daily.    [provider]  ibuprofen (ADVIL,MOTRIN) 200 MG tablet Take 400 mg by mouth daily as needed for moderate pain.    [provider]  ipratropium-albuterol (DUONEB) 0.5-2.5 (3) MG/3ML SOLN Inhale into the lungs. Patient not taking: Reported on 01/29/2024 06/15/23   [provider]  norethindrone (MICRONOR) 0.35 MG tablet Take 1 tablet (0.35 mg total) by mouth daily. Patient not taking: Reported on 01/29/2024 03/02/23   Lorriane Shire, MD  Norgestimate-Ethinyl Estradiol Triphasic (TRI-SPRINTEC) 0.18/0.215/0.25 MG-35 MCG tablet Take 1 tablet by mouth daily. Patient not taking: Reported on 01/29/2024    [provider]  nystatin ointment (MYCOSTATIN) Apply 1 Application topically 2 (two) times daily. Patient not taking: Reported on 01/29/2024 03/02/23   Lorriane Shire, MD  ondansetron (ZOFRAN-ODT) 4 MG disintegrating tablet 4mg  ODT q4 hours prn nausea/vomit 01/29/24   Bethann Berkshire, MD  Oxycodone HCl 10  MG TABS Take 10 mg by mouth 3 (three) times daily as needed (Pain).    [provider]  Oxycodone HCl 10 MG TABS Take by mouth.    [provider]  Vitamin D, Ergocalciferol, (DRISDOL) 1.25 MG (50000 UNIT) CAPS capsule Take 1 capsule by mouth once a week. 08/29/23   [provider]      Allergies    Acetaminophen, Hydrocodone-acetaminophen, Vicodin [hydrocodone-acetaminophen], and Prednisone    Review of Systems   Review of Systems  Respiratory:  Positive for shortness of breath.     Physical Exam Updated Vital Signs BP (!) 149/107   Pulse (!) 141   Temp 99.2 F (37.3 C) (Oral)   Resp 17   LMP 01/10/2024 (Approximate) Comment: negative HCG blood test 01-29-2024  SpO2 96%  Physical Exam  ED Results / Procedures / Treatments   Labs (all labs ordered are listed, but only abnormal results are displayed) Labs Reviewed  RESP PANEL BY RT-PCR (RSV, FLU A&B, COVID)  RVPGX2  BASIC METABOLIC PANEL  CBC  TROPONIN I (HIGH SENSITIVITY)    EKG None  Radiology No results found.  Procedures Procedures  {Document cardiac monitor, telemetry assessment procedure when appropriate:1}  Medications Ordered in ED Medications  ipratropium-albuterol (DUONEB) 0.5-2.5 (3) MG/3ML nebulizer solution 9 mL (has no administration in time range)  methylPREDNISolone sodium succinate (SOLU-MEDROL) 125 mg/2 mL injection 125 mg (has no administration in time range)  magnesium sulfate IVPB 2 g 50 mL (has no administration in time range)    ED Course/ Medical Decision Making/ A&P   {  Click here for ABCD2, HEART and other calculatorsREFRESH Note before signing :1}                              Medical Decision Making Amount and/or Complexity of Data Reviewed Labs: ordered. Radiology: ordered.  Risk Prescription drug management.   ***  {Document critical care time when appropriate:1} {Document review of labs and clinical decision tools ie heart score, Chads2Vasc2  etc:1}  {Document your independent review of radiology images, and any outside records:1} {Document your discussion with family members, caretakers, and with consultants:1} {Document social determinants of health affecting pt's care:1} {Document your decision making why or why not admission, treatments were needed:1} Final Clinical Impression(s) / ED Diagnoses Final diagnoses:  None    Rx / DC Orders ED Discharge Orders     None

## 2024-02-21 NOTE — ED Notes (Signed)
 Pt tripod position and unable to speak more than 3 words with each breath.  Pharmacy contacted to verify meds, respiratory called

## 2024-02-21 NOTE — Progress Notes (Signed)
   02/21/24 1700  Assess: MEWS Score  Temp 99.4 F (37.4 C)  BP (!) 182/117  MAP (mmHg) 134  Pulse Rate (!) 132  Resp (!) 25  SpO2 98 %  O2 Device Nasal Cannula  O2 Flow Rate (L/min) 1 L/min  Assess: MEWS Score  MEWS Temp 0  MEWS Systolic 0  MEWS Pulse 3  MEWS RR 1  MEWS LOC 0  MEWS Score 4  MEWS Score Color Red  Assess: if the MEWS score is Yellow or Red  Were vital signs accurate and taken at a resting state? Yes  Does the patient meet 2 or more of the SIRS criteria? Yes  Does the patient have a confirmed or suspected source of infection? No  MEWS guidelines implemented  Yes, red  Treat  MEWS Interventions Considered administering scheduled or prn medications/treatments as ordered  Take Vital Signs  Increase Vital Sign Frequency  Red: Q1hr x2, continue Q4hrs until patient remains green for 12hrs  Escalate  MEWS: Escalate Red: Discuss with charge nurse and notify provider. Consider notifying RRT. If remains red for 2 hours consider need for higher level of care  Notify: Charge Nurse/RN  Name of Charge Nurse/RN Notified Megan B., RN  Provider Notification  Provider Name/Title [place]  Date Provider Notified 02/21/24  Time Provider Notified 1705  Method of Notification Page (secure chat)  Notification Reason Other (Comment) (Red MEWS)  Provider response At bedside;See new orders  Date of Provider Response 02/21/24  Time of Provider Response 1710  Notify: Rapid Response  Name of Rapid Response RN Notified Zoe (Notified by MD)  Date Rapid Response Notified 02/21/24  Time Rapid Response Notified 1710  Assess: SIRS CRITERIA  SIRS Temperature  0  SIRS Respirations  1  SIRS Pulse 1  SIRS WBC 0  SIRS Score Sum  2   Patient admitted from ED in respiratory distress, This RN notified MD and CN. RR notified by MD. New orders provided. Patient began to stabilize and respiratory distress decreased significantly.

## 2024-02-21 NOTE — H&P (Signed)
 History and Physical    Patient: [name] ZOX:[id number] DOB: [birth year] DOA: 02/20/2024 DOS: the patient was seen and examined on 02/21/2024 PCP: Bettey Costa, NP  Patient coming from: Home  Chief Complaint:  Chief Complaint  Patient presents with   Shortness of Breath   HPI: [name] is a 48 y.o. female with medical history significant of asthma, GERD and hypertension; who presented to the emergency department secondary to shortness of breath and increased expiratory wheezing.  Patient reports symptom has been present for the last 3 days or so and worsening; despite the use of bronchodilator management at home has not been able to control the ongoing shortness of breath process.  No fever, no chest pain, no nausea, no vomiting, no sick contacts.  In the ED workup demonstrating negative COVID PCR, influenza and RSV; chest x-ray without acute infiltrates.  Patient with diffuse expiratory wheezing and tachypnea on examination.  DuoNebs, steroids and magnesium and has been provided and [facility] contacted to place patient in the hospital for further evaluation and management.  Of note, patient has no follow-up with pulmonologist in a while and do not have any maintenance therapy for her asthma.  Review of Systems: As mentioned in the history of present illness. All other systems reviewed and are negative. Past Medical History:  Diagnosis Date   Acute bronchitis    Anemia    Asthma    Bilateral ovarian cysts    Bipolar 1 disorder, depressed, mild (HCC)    COVID-19 12/2020   Fibroid    GERD (gastroesophageal reflux disease)    History of blood transfusion 1994   during delivery   HTN (hypertension)    Migraines    Pregnancy induced hypertension    Seizures (HCC)    during labor just that one time   Past Surgical History:  Procedure Laterality Date   CESAREAN SECTION     LAPAROTOMY N/A 05/05/2016   Procedure: EXPLORATORY LAPAROTOMY WITH ULTRASOUND;  Surgeon: Hoover Browns,  MD;  Location: [facility];  Service: Gynecology;  Laterality: N/A;  needs ultrasound ...Marland KitchenMarland KitchenMarland Kitchenlysis of adhesions    MYOMECTOMY N/A 05/05/2016   Procedure: MYOMECTOMY, Biopsy Right Ovary & Peritoneum;  Surgeon: Hoover Browns, MD;  Location: [facility];  Service: Gynecology;  Laterality: N/A;   TUBAL LIGATION     Social History:  reports that she quit smoking about 9 years ago. Her smoking use included cigarettes. She started smoking about 29 years ago. She has a 5 pack-year smoking history. She has never used smokeless tobacco. She reports that she does not currently use alcohol. She reports that she does not currently use drugs after having used the following drugs: Marijuana.  Allergies  Allergen Reactions   Acetaminophen Hives   Hydrocodone-Acetaminophen Hives    acetaminophen / hydrocodone  Pt says she can take percocet   Vicodin [Hydrocodone-Acetaminophen] Hives    Pt says she can take percocet   Prednisone Other (See Comments), Rash and Swelling    Pt states that this medication causes her legs to swell.  Pt also states that she does not have any problems with the IV form of this medication.  prednisone    Family History  Problem Relation Age of Onset   Asthma Mother    Asthma Brother    Lymphoma Brother    Breast cancer Neg Hx     Prior to Admission medications   Medication Sig Start Date End Date Taking? Authorizing Provider  albuterol (PROVENTIL) (5 MG/ML) 0.5%  nebulizer solution Take 0.5 mLs (2.5 mg total) by nebulization every 4 (four) hours as needed for wheezing or shortness of breath. Patient not taking: Reported on 02/21/2024 06/16/16   Lavera Guise, MD  albuterol (VENTOLIN HFA) 108 (90 Base) MCG/ACT inhaler Inhale 2 puffs into the lungs every 6 (six) hours as needed for wheezing or shortness of breath.   Yes [provider]  hydrochlorothiazide (HYDRODIURIL) 12.5 MG tablet Take 1 tablet by mouth daily. 08/29/23  Yes [provider]  ipratropium-albuterol (DUONEB)  0.5-2.5 (3) MG/3ML SOLN Inhale 3 mLs into the lungs every 6 (six) hours as needed. 06/15/23  Yes [provider]  Oxycodone HCl 10 MG TABS Take 10 mg by mouth 3 (three) times daily as needed (Pain).   Yes [provider]  Vitamin D, Ergocalciferol, (DRISDOL) 1.25 MG (50000 UNIT) CAPS capsule Take 1 capsule by mouth once a week. 08/29/23  Yes [provider]  ondansetron (ZOFRAN-ODT) 4 MG disintegrating tablet 4mg  ODT q4 hours prn nausea/vomit Patient not taking: Reported on 02/21/2024 01/29/24   Bethann Berkshire, MD    Physical Exam: Vitals:   02/20/24 2345 02/21/24 0100 02/21/24 0330 02/21/24 0715  BP: (!) 149/107 (!) 144/95    Pulse: (!) 141 97  (!) 118  Resp: 17 15  (!) 21  Temp: 99.2 F (37.3 C)  98.4 F (36.9 C)   TempSrc: Oral  Oral   SpO2: 96% 100%  98%   General exam: Alert, awake, oriented x 3; demonstrating difficulty to speak in full sentences; no chest pain, no nausea vomiting.  Afebrile Respiratory system: Fair air movement bilaterally; no using accessory muscles at time of examination.  Diffuse expiratory wheezing and positive rhonchi appreciated. Cardiovascular system: Sinus tachycardia, no rubs, no gallops, no JVD. Gastrointestinal system: Abdomen is nondistended, soft and nontender. No organomegaly or masses felt. Normal bowel sounds heard. Central nervous system: No focal neurological deficits. Extremities: No cyanosis, no clubbing. Skin: No petechiae. Psychiatry: Judgement and insight appear normal. Mood & affect appropriate.   Data Reviewed: Respiratory panel: Negative COVID, influenza and RSV D-dimer: 0.34 CBC: WBC 7.6, hemoglobin 10.7 and platelet count 426K Phosphorus: 1.7 Magnesium: 1.8 Chest x-ray: No acute cardiopulmonary process.  Assessment and Plan: 1-asthma exacerbation -As needed oxygen supplementation with provide -Patient admitted for IV steroids, bronchodilator management and supportive care -Pulmicort, flutter valve and  DuoNeb has been ordered. -Follow clinical response. -Outpatient follow-up with pulmonology recommended.  2-essential hypertension -Using hydrochlorothiazide at home -As needed hydralazine has been ordered while inpatient -Holding diuretics overnight in the setting of hypokalemia  3-hypokalemia -Replete electrolytes and follow trend. -Continue telemetry monitoring at the moment.  4-GERD -continue PPI  5-history of tobacco abuse -Patient reported being smoking free since 2019 -Congratulated and advised not to restart; also to avoid secondhand smoking.  6-tachycardia -Sinus in nature; associated with extensive use of albuterol -No chest pain. -Telemetry monitoring at the moment; replete electrolytes -Follow clinical response.  7-history of chronic pain -Continue home analgesic regimen.    Advance Care Planning:   Code Status: Full Code   Consults: None  Family Communication: See below for x-ray reports.  Severity of Illness: The appropriate patient status for this patient is OBSERVATION. Observation status is judged to be reasonable and necessary in order to provide the required intensity of service to ensure the patient's safety. The patient's presenting symptoms, physical exam findings, and initial radiographic and laboratory data in the context of their medical condition is felt to place them at  decreased risk for further clinical deterioration. Furthermore, it is anticipated that the patient will be medically stable for discharge from the hospital within 2 midnights of admission.   Author: Vassie Loll, MD 02/21/2024 8:10 AM  For on call review [email or web address].

## 2024-02-22 DIAGNOSIS — J45901 Unspecified asthma with (acute) exacerbation: Secondary | ICD-10-CM | POA: Diagnosis not present

## 2024-02-22 DIAGNOSIS — E871 Hypo-osmolality and hyponatremia: Secondary | ICD-10-CM | POA: Diagnosis present

## 2024-02-22 DIAGNOSIS — Z888 Allergy status to other drugs, medicaments and biological substances status: Secondary | ICD-10-CM | POA: Diagnosis not present

## 2024-02-22 DIAGNOSIS — Z885 Allergy status to narcotic agent status: Secondary | ICD-10-CM | POA: Diagnosis not present

## 2024-02-22 DIAGNOSIS — Z825 Family history of asthma and other chronic lower respiratory diseases: Secondary | ICD-10-CM | POA: Diagnosis not present

## 2024-02-22 DIAGNOSIS — J4551 Severe persistent asthma with (acute) exacerbation: Secondary | ICD-10-CM | POA: Diagnosis present

## 2024-02-22 DIAGNOSIS — E66812 Obesity, class 2: Secondary | ICD-10-CM | POA: Diagnosis present

## 2024-02-22 DIAGNOSIS — G894 Chronic pain syndrome: Secondary | ICD-10-CM | POA: Diagnosis present

## 2024-02-22 DIAGNOSIS — Z79899 Other long term (current) drug therapy: Secondary | ICD-10-CM | POA: Diagnosis not present

## 2024-02-22 DIAGNOSIS — Z6835 Body mass index (BMI) 35.0-35.9, adult: Secondary | ICD-10-CM | POA: Diagnosis not present

## 2024-02-22 DIAGNOSIS — Z8616 Personal history of COVID-19: Secondary | ICD-10-CM | POA: Diagnosis not present

## 2024-02-22 DIAGNOSIS — E876 Hypokalemia: Secondary | ICD-10-CM | POA: Diagnosis present

## 2024-02-22 DIAGNOSIS — Z1152 Encounter for screening for COVID-19: Secondary | ICD-10-CM | POA: Diagnosis not present

## 2024-02-22 DIAGNOSIS — K219 Gastro-esophageal reflux disease without esophagitis: Secondary | ICD-10-CM | POA: Diagnosis present

## 2024-02-22 DIAGNOSIS — I10 Essential (primary) hypertension: Secondary | ICD-10-CM | POA: Diagnosis present

## 2024-02-22 DIAGNOSIS — F319 Bipolar disorder, unspecified: Secondary | ICD-10-CM | POA: Diagnosis present

## 2024-02-22 LAB — CBC
HCT: 36.1 % (ref 36.0–46.0)
Hemoglobin: 11.2 g/dL — ABNORMAL LOW (ref 12.0–15.0)
MCH: 27.1 pg (ref 26.0–34.0)
MCHC: 31 g/dL (ref 30.0–36.0)
MCV: 87.4 fL (ref 80.0–100.0)
Platelets: 417 10*3/uL — ABNORMAL HIGH (ref 150–400)
RBC: 4.13 MIL/uL (ref 3.87–5.11)
RDW: 15.4 % (ref 11.5–15.5)
WBC: 8.1 10*3/uL (ref 4.0–10.5)
nRBC: 0 % (ref 0.0–0.2)

## 2024-02-22 LAB — BASIC METABOLIC PANEL
Anion gap: 12 (ref 5–15)
BUN: 10 mg/dL (ref 6–20)
CO2: 22 mmol/L (ref 22–32)
Calcium: 9.4 mg/dL (ref 8.9–10.3)
Chloride: 102 mmol/L (ref 98–111)
Creatinine, Ser: 0.87 mg/dL (ref 0.44–1.00)
GFR, Estimated: >60 mL/min (ref >60)
Glucose, Bld: 139 mg/dL — ABNORMAL HIGH (ref 70–99)
Potassium: 4.4 mmol/L (ref 3.5–5.1)
Sodium: 136 mmol/L (ref 135–145)

## 2024-02-22 MED ORDER — HYDROCHLOROTHIAZIDE 12.5 MG PO TABS
12.5000 mg | ORAL_TABLET | Freq: Every day | ORAL | Status: DC
  Administered 2024-02-23: 12.5 mg via ORAL
  Filled 2024-02-22: qty 1

## 2024-02-22 NOTE — Progress Notes (Signed)
  Progress Note   Patient: [name] AVW:[id number] DOB: 1976-07-16 DOA: 02/20/2024     0 DOS: the patient was seen and examined on 02/22/2024        Brief hospital course: 48 yo F with asthma, HTN presented with dyspnea, wheezing and cough.       Assessment and Plan: Acute asthma exacerbation Patient was in distress on arrival to the floor, required frequent nebulized bronchodilators.  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  Dizzy.     Physical Exam: BP (!) 134/94 (BP Location: Right Arm)   Pulse 91   Temp 98.6 F (37 C) (Oral)   Resp 18   Ht 5' (1.524 m)   Wt 83.1 kg   LMP 01/10/2024 (Approximate) Comment: negative HCG blood test 01-29-2024  SpO2 97%   BMI 35.76 kg/m   Adult female, sitting in bed, appears weak and listless Tachycardic, regular, no pitting, no JVD Respiratory rate elevated, lung sounds diminished with coarse wheezing bilaterally, extensive Abdomen soft no tenderness palpation or guarding Attention normal, affect blunted, judgment and insight are normal, face symmetric, speech fluent    Data Reviewed: Patient metabolic panel unremarkable CBC normal  Family Communication:     Disposition: Status is: Inpatient         Author: Alberteen Sam, MD 02/22/2024 5:22 PM  For on call review [email or web address].

## 2024-02-22 NOTE — Plan of Care (Signed)
  Problem: Clinical Measurements: Goal: Respiratory complications will improve Outcome: Progressing   Problem: Clinical Measurements: Goal: Respiratory complications will improve Outcome: Progressing   Problem: Clinical Measurements: Goal: Respiratory complications will improve Outcome: Progressing

## 2024-02-22 NOTE — Progress Notes (Signed)
 Pt walked approximately 1000 ft on room air. O2 sats fluctuated between 92-100%. Denies shortness of breath, no wheezing noted. Endorse lightheadedness. Heart rate fluctuated between 120-130. Returned back to bed, left resting comfortably.

## 2024-02-22 NOTE — Plan of Care (Signed)

## 2024-02-22 NOTE — TOC CM/SW Note (Signed)
 Transition of Care [facility]) - Inpatient Brief Assessment  Patient Details  Name: [name] MRN: [id number] Date of Birth: June 29, 1976  Transition of Care [facility]) CM/SW Contact:    Ewing Schlein, LCSW Phone Number: 02/22/2024, 9:04 AM  Clinical Narrative: TOC screening completed. No needs identified. Patient has a nebulizer at home.  Transition of Care Asessment: Insurance and Status: Insurance coverage has been reviewed Patient has primary care physician: Yes Home environment has been reviewed: Resides in single family home Prior level of function:: Independent with ADLs at baseline Prior/Current Home Services: No current home services Social Drivers of Health Review: SDOH reviewed no interventions necessary Readmission risk has been reviewed: Yes Transition of care needs: no transition of care needs at this time

## 2024-02-23 ENCOUNTER — Other Ambulatory Visit (HOSPITAL_COMMUNITY): Payer: Self-pay

## 2024-02-23 ENCOUNTER — Telehealth: Payer: Self-pay | Admitting: Nurse Practitioner

## 2024-02-23 DIAGNOSIS — J45901 Unspecified asthma with (acute) exacerbation: Secondary | ICD-10-CM | POA: Diagnosis not present

## 2024-02-23 MED ORDER — IPRATROPIUM-ALBUTEROL 0.5-2.5 (3) MG/3ML IN SOLN
3.0000 mL | Freq: Four times a day (QID) | RESPIRATORY_TRACT | 0 refills | Status: AC | PRN
  Filled 2024-02-23: qty 360, 30d supply, fill #0

## 2024-02-23 MED ORDER — IPRATROPIUM-ALBUTEROL 0.5-2.5 (3) MG/3ML IN SOLN: 3.0000 mL | Freq: Three times a day (TID) | RESPIRATORY_TRACT | Status: DC

## 2024-02-23 MED ORDER — PREDNISONE 20 MG PO TABS
40.0000 mg | ORAL_TABLET | Freq: Every day | ORAL | 0 refills | Status: AC
  Filled 2024-02-23: qty 6, 3d supply, fill #0

## 2024-02-23 MED ORDER — BUDESONIDE-FORMOTEROL FUMARATE 80-4.5 MCG/ACT IN AERO
2.0000 | INHALATION_SPRAY | Freq: Two times a day (BID) | RESPIRATORY_TRACT | 0 refills | Status: DC
  Filled 2024-02-23: qty 10.2, 30d supply, fill #0

## 2024-02-23 NOTE — Progress Notes (Signed)
 Discharge medications delivered from [facility] outpatient pharmacy by this RN

## 2024-02-23 NOTE — Discharge Summary (Signed)
 Physician Discharge Summary   Patient: [name] MRN: [id number] DOB: [birth year]  Admit date:     02/20/2024  Discharge date: 02/23/24  Discharge Physician: Alberteen Sam   PCP: Bettey Costa, NP     Recommendations at discharge:  Follow up with PCP Randalyn Rhea in 1 week for asthma flare Follow up with Pulmonology in 4-6 weeks for high risk asthma, referral sent     Discharge Diagnoses: Principal Problem:   Acute asthma exacerbation Active Problems:   Tobacco abuse   HTN (hypertension)   Hypokalemia   Tachycardia   Class II Obesity   Chronic pain syndrome    Hospital Course: 48 y.o. F with hx recurrent bronchospasm episodes treated with steroids who presented with few days shortness of breath, coughing up sputum, progressed to be severe.  In the ER, severe wheezing, in respiratory distress, CXR clear.    Acute asthma exacerbation Has a history of recurrent wheezing episodes usually provoked by irritants, this time maybe by a URI.  Said she has been told conflicting things about whether she has asthma.  However, she has been hospitalized twice at [facility] for asthma, and required steroids "several times" in the last two years for flares.  Not currently on ICS.  Here, was in severe distress with breathing, out of proportion to mild hypoxia.    Treated with solumedrol, bronchodilators and improved.  Discharged to complete 5 days and with Symbicort.                The [facility] Controlled Substances Registry was reviewed for this patient prior to discharge.  Consultants: None Procedures performed: None  Disposition: Home Diet recommendation:  Discharge Diet Orders (From admission, onward)     Start     Ordered   02/23/24 0000  Diet - low sodium heart healthy        02/23/24 1047             DISCHARGE MEDICATION: Allergies as of 02/23/2024       Reactions   Acetaminophen Hives   Hydrocodone-acetaminophen Hives   acetaminophen /  hydrocodone Pt says she can take percocet   Vicodin [hydrocodone-acetaminophen] Hives   Pt says she can take percocet   Prednisone Other (See Comments), Rash, Swelling   Pt states that this medication causes her legs to swell.  Pt also states that she does not have any problems with the IV form of this medication. prednisone        Medication List     TAKE these medications    albuterol 108 (90 Base) MCG/ACT inhaler Commonly known as: VENTOLIN HFA Inhale 2 puffs into the lungs every 6 (six) hours as needed for wheezing or shortness of breath.   hydrochlorothiazide 12.5 MG tablet Commonly known as: HYDRODIURIL Take 1 tablet by mouth daily.   ipratropium-albuterol 0.5-2.5 (3) MG/3ML Soln Commonly known as: DUONEB Inhale 3 mLs into the lungs every 6 (six) hours as needed.   Oxycodone HCl 10 MG Tabs Take 10 mg by mouth 3 (three) times daily as needed (Pain).   predniSONE 20 MG tablet Commonly known as: DELTASONE Take 2 tablets (40 mg total) by mouth daily for 3 days.   Symbicort 80-4.5 MCG/ACT inhaler Generic drug: budesonide-formoterol Inhale 2 puffs into the lungs in the morning and at bedtime.   Vitamin D (Ergocalciferol) 1.25 MG (50000 UNIT) Caps capsule Commonly known as: DRISDOL Take 1 capsule by mouth once a week. Notes to patient: Last dose received  on 3/6 AM        Follow-up Information     Bettey Costa, NP. Schedule an appointment as soon as possible for a visit in 1 week(s).   Specialties: Nurse Practitioner, Pain Medicine Contact information: [address] [place] [address] [phone]                 Discharge Instructions     Diet - low sodium heart healthy   Complete by: As directed    Discharge instructions   Complete by: As directed    **IMPORTANT DISCHARGE INSTRUCTIONS**   From Dr. Maryfrances Bunnell: You were admitted for an asthma flare  Here, your chest x-ray was clear  You were treated with steroids and albuterol  and your symptoms improved  You should continue steroids and complete a 5 day burst with prednisone 40 mg (2 tabs) once daily in the morning for 3 more days starting tomorrow  After the prednisone is gone, start the daily maintenance inhaler  For the next few days, you are likely to need your albuterol inhaler (or the Duoneb nebulizer) 2-3 times daily at least  After that, you can back off to as needed use  Go see your primary in 1 week  We have sent a referral to pulmonology   Increase activity slowly   Complete by: As directed        Discharge Exam: Filed Weights   02/21/24 1758  Weight: 83.1 kg    General: Pt is alert, awake, not in acute distress Cardiovascular: RRR, nl S1-S2, no murmurs appreciated.   No LE edema.   Respiratory: Normal respiratory rate and rhythm.  Some wheezing, mild, improved from previous Abdominal: Abdomen soft and non-tender.  No distension or HSM.   Neuro/Psych: Strength symmetric in upper and lower extremities.  Judgment and insight appear normal.   Condition at discharge: good  The results of significant diagnostics from this hospitalization (including imaging, microbiology, ancillary and laboratory) are listed below for reference.   Imaging Studies: DG Chest 2 View Result Date: 02/21/2024 CLINICAL DATA:  Shortness of breath x1 day. EXAM: CHEST - 2 VIEW COMPARISON:  January 02, 2021 FINDINGS: The heart size and mediastinal contours are within normal limits. Both lungs are clear. The visualized skeletal structures are unremarkable. IMPRESSION: No active cardiopulmonary disease. Electronically Signed   By: Aram Candela M.D.   On: 02/21/2024 01:32   MM 3D SCREENING MAMMOGRAM BILATERAL BREAST Result Date: 02/01/2024 CLINICAL DATA:  Screening. EXAM: DIGITAL SCREENING BILATERAL MAMMOGRAM WITH TOMOSYNTHESIS AND CAD TECHNIQUE: Bilateral screening digital craniocaudal and mediolateral oblique mammograms were obtained. Bilateral screening digital  breast tomosynthesis was performed. The images were evaluated with computer-aided detection. COMPARISON:  Previous exam(s). ACR Breast Density Category b: There are scattered areas of fibroglandular density. FINDINGS: There are no findings suspicious for malignancy. IMPRESSION: No mammographic evidence of malignancy. A result letter of this screening mammogram will be mailed directly to the patient. RECOMMENDATION: Screening mammogram in one year. (Code:SM-B-01Y) BI-RADS CATEGORY  1: Negative. Electronically Signed   By: Amie Portland M.D.   On: 02/01/2024 16:12   US PELVIC COMPLETE W TRANSVAGINAL AND TORSION R/O Result Date: 01/29/2024 CLINICAL DATA:  Abdominal pain x1 week. EXAM: TRANSABDOMINAL AND TRANSVAGINAL ULTRASOUND OF PELVIS DOPPLER ULTRASOUND OF OVARIES TECHNIQUE: Both transabdominal and transvaginal ultrasound examinations of the pelvis were performed. Transabdominal technique was performed for global imaging of the pelvis including uterus, ovaries, adnexal regions, and pelvic cul-de-sac. It was necessary to proceed with endovaginal  exam following the transabdominal exam to visualize the uterus, endometrium, bilateral ovaries and bilateral adnexa. Color and duplex Doppler ultrasound was utilized to evaluate blood flow to the ovaries. COMPARISON:  None Available. FINDINGS: Uterus Measurements: 10.5 cm x 6.9 cm x 8.0 cm = volume: 302.5 mL. Several large, heterogeneous uterine fibroids are noted. The largest measures approximately 4.2 cm x 3.8 cm x 3.9 cm. Endometrium Thickness: 8.6.  No focal abnormality visualized. Right ovary Measurements: 4.0 cm x 2.5 cm x 2.2 cm = volume: 14.6 mL. Normal appearance/no adnexal mass. Left ovary The left ovary is not visualized. Pulsed Doppler evaluation of the RIGHT ovary demonstrates normal low-resistance arterial and venous waveforms. Other findings No abnormal free fluid. IMPRESSION: 1. Multiple large, heterogeneous uterine fibroids. 2. Nonvisualization of the LEFT  ovary. Electronically Signed   By: Aram Candela M.D.   On: 01/29/2024 22:46   CT ABDOMEN PELVIS W CONTRAST Result Date: 01/29/2024 CLINICAL DATA:  Right lower quadrant pain. EXAM: CT ABDOMEN AND PELVIS WITH CONTRAST TECHNIQUE: Multidetector CT imaging of the abdomen and pelvis was performed using the standard protocol following bolus administration of intravenous contrast. RADIATION DOSE REDUCTION: This exam was performed according to the departmental dose-optimization program which includes automated exposure control, adjustment of the mA and/or kV according to patient size and/or use of iterative reconstruction technique. CONTRAST:  OMNIPAQUE IOHEXOL 300 MG/ML SOLN, 80mL OMNIPAQUE IOHEXOL 300 MG/ML SOLN COMPARISON:  CT 12/18/2014 abdomen pelvis FINDINGS: Lower chest: Semi-solid nodule left lower lobe measures 6 mm on series 6, image 13. There is a nodule in this location previously measuring 6 mm. Long term stability. Small hiatal hernia. Hepatobiliary: Fatty liver infiltration. Gallbladder is contracted. Patent portal vein. Tiny cystic focus seen in segment 4B on series 2, image 29, too small to characterize although statistically likely a benign cystic lesion. No specific imaging follow-up. Pancreas: Unremarkable. No pancreatic ductal dilatation or surrounding inflammatory changes. Spleen: Normal in size without focal abnormality. Adrenals/Urinary Tract: Adrenal glands are preserved. No enhancing renal mass or collecting system dilatation. There is some contrast in the renal collecting systems. Please correlate with any previous contrast administration or other process. Preserved contour to the urinary bladder. Stomach/Bowel: There is some fluid in the stomach. Small bowel is nondilated. Large bowel is normal course and caliber with scattered colonic stool. Few left-sided colonic diverticula. Normal appendix. Vascular/Lymphatic: Normal caliber aorta and IVC. No specific abnormal lymph node  enlargement identified in the abdomen and pelvis. Few retroperitoneal nodes are not pathologic by size criteria and unchanged from previous. Reproductive: Multilobular uterus consistent with multiple fibroids. No separate adnexal mass. Other: No free air or free fluid. Musculoskeletal: No acute or significant osseous findings. IMPRESSION: Scattered colonic stool. No bowel obstruction. Normal appendix. Minimal diverticula. Small hiatal hernia. Fatty liver infiltration. There is contrast in the renal collecting systems. Please correlate for any previous contrast administration or other process. Electronically Signed   By: Karen Kays M.D.   On: 01/29/2024 18:56    Microbiology: Results for orders placed or performed during the hospital encounter of 02/20/24  Resp panel by RT-PCR (RSV, Flu A&B, Covid) Anterior Nasal Swab     Status: None   Collection Time: 02/21/24 12:45 AM   Specimen: Anterior Nasal Swab  Result Value Ref Range Status   SARS Coronavirus 2 by RT PCR NEGATIVE NEGATIVE Final    Comment: (NOTE) SARS-CoV-2 target nucleic acids are NOT DETECTED.  The SARS-CoV-2 RNA is generally detectable in upper respiratory specimens during the acute  phase of infection. The lowest concentration of SARS-CoV-2 viral copies this assay can detect is 138 copies/mL. A negative result does not preclude SARS-Cov-2 infection and should not be used as the sole basis for treatment or other patient management decisions. A negative result may occur with  improper specimen collection/handling, submission of specimen other than nasopharyngeal swab, presence of viral mutation(s) within the areas targeted by this assay, and inadequate number of viral copies(<138 copies/mL). A negative result must be combined with clinical observations, patient history, and epidemiological information. The expected result is Negative.  Fact Sheet for Patients:  BloggerCourse.com  Fact Sheet for  Healthcare Providers:  SeriousBroker.it  This test is no t yet approved or cleared by the [place] FDA and  has been authorized for detection and/or diagnosis of SARS-CoV-2 by FDA under an Emergency Use Authorization (EUA). This EUA will remain  in effect (meaning this test can be used) for the duration of the COVID-19 declaration under Section 564(b)(1) of the Act, 21 U.S.C.section 360bbb-3(b)(1), unless the authorization is terminated  or revoked sooner.       Influenza A by PCR NEGATIVE NEGATIVE Final   Influenza B by PCR NEGATIVE NEGATIVE Final    Comment: (NOTE) The Xpert Xpress SARS-CoV-2/FLU/RSV plus assay is intended as an aid in the diagnosis of influenza from Nasopharyngeal swab specimens and should not be used as a sole basis for treatment. Nasal washings and aspirates are unacceptable for Xpert Xpress SARS-CoV-2/FLU/RSV testing.  Fact Sheet for Patients: BloggerCourse.com  Fact Sheet for Healthcare Providers: SeriousBroker.it  This test is not yet approved or cleared by the [place] FDA and has been authorized for detection and/or diagnosis of SARS-CoV-2 by FDA under an Emergency Use Authorization (EUA). This EUA will remain in effect (meaning this test can be used) for the duration of the COVID-19 declaration under Section 564(b)(1) of the Act, 21 U.S.C. section 360bbb-3(b)(1), unless the authorization is terminated or revoked.     Resp Syncytial Virus by PCR NEGATIVE NEGATIVE Final    Comment: (NOTE) Fact Sheet for Patients: BloggerCourse.com  Fact Sheet for Healthcare Providers: SeriousBroker.it  This test is not yet approved or cleared by the [place] FDA and has been authorized for detection and/or diagnosis of SARS-CoV-2 by FDA under an Emergency Use Authorization (EUA). This EUA will remain in effect (meaning this  test can be used) for the duration of the COVID-19 declaration under Section 564(b)(1) of the Act, 21 U.S.C. section 360bbb-3(b)(1), unless the authorization is terminated or revoked.  Performed at [facility], [address] [place], [place] [address]     Labs: CBC: Recent Labs  Lab 02/20/24 0006 02/21/24 1052 02/22/24 0527  WBC 10.1 7.6 8.1  HGB 11.1* 10.7* 11.2*  HCT 35.4* 33.5* 36.1  MCV 85.9 85.5 87.4  PLT 460* 426* 417*   Basic Metabolic Panel: Recent Labs  Lab 02/20/24 0006 02/21/24 1052 02/22/24 0527  NA 133*  --  136  K 3.3*  --  4.4  CL 104  --  102  CO2 17*  --  22  GLUCOSE 106*  --  139*  BUN 8  --  10  CREATININE 0.88 0.99 0.87  CALCIUM 9.0  --  9.4  MG  --  1.8  --   PHOS  --  1.7*  --    Liver Function Tests: No results for input(s): "AST", "ALT", "ALKPHOS", "BILITOT", "PROT", "ALBUMIN" in the last 168 hours. CBG: No results for input(s): "GLUCAP" in  the last 168 hours.  Discharge time spent: approximately 45 minutes spent on discharge counseling, evaluation of patient on day of discharge, and coordination of discharge planning with nursing, social work, pharmacy and case management  Signed: Alberteen Sam, MD [facility] 02/23/2024

## 2024-04-23 ENCOUNTER — Encounter: Payer: Self-pay | Admitting: Pulmonary Disease

## 2024-04-23 ENCOUNTER — Ambulatory Visit: Admitting: Pulmonary Disease

## 2024-04-23 NOTE — Progress Notes (Deleted)
 Synopsis: Referred in May 2025 for Asthma  Subjective:   PATIENT ID: [name] GENDER: female DOB: [birth year], MRN: [id number]   HPI  No chief complaint on file.  [name] is a 48 year old woman, former smoker with history of asthma who is referred to pulmonary clinic.  She was admitted 3/5 to 3/7 for asthma exacerbation.   Absolute eosinophil count has ranged 400-500 in the past.   Past Medical History:  Diagnosis Date   Acute bronchitis    Anemia    Asthma    Bilateral ovarian cysts    Bipolar 1 disorder, depressed, mild (HCC)    COVID-19 12/2020   Fibroid    GERD (gastroesophageal reflux disease)    History of blood transfusion 1994   during delivery   HTN (hypertension)    Migraines    Pregnancy induced hypertension    Seizures (HCC)    during labor just that one time     Family History  Problem Relation Age of Onset   Asthma Mother    Asthma Brother    Lymphoma Brother    Breast cancer Neg Hx      Social History   Socioeconomic History   Marital status: Single    Spouse name: Not on file   Number of children: Not on file   Years of education: Not on file   Highest education level: Not on file  Occupational History   Not on file  Tobacco Use   Smoking status: Former    Current packs/day: 0.00    Average packs/day: 0.3 packs/day for 20.0 years (5.0 ttl pk-yrs)    Types: Cigarettes    Start date: 04/09/1994    Quit date: 04/09/2014    Years since quitting: 10.0   Smokeless tobacco: Never  Vaping Use   Vaping status: Never Used  Substance and Sexual Activity   Alcohol use: Not Currently    Comment: twice a year   Drug use: Not Currently    Types: Marijuana    Comment: recreational use edible last week 04/18/16   Sexual activity: Yes    Birth control/protection: Surgical, None  Other Topics Concern   Not on file  Social History Narrative   Not on file   Social Drivers of Health   Financial Resource Strain: Not on file  Food  Insecurity: No Food Insecurity (02/21/2024)   Hunger Vital Sign    Worried About Running Out of Food in the Last Year: Never true    Ran Out of Food in the Last Year: Never true  Transportation Needs: No Transportation Needs (02/21/2024)   PRAPARE - Administrator, Civil Service (Medical): No    Lack of Transportation (Non-Medical): No  Physical Activity: Not on file  Stress: Not on file  Social Connections: Unknown (04/27/2022)   Received from [facility], [facility]   Social Network    Social Network: Not on file  Intimate Partner Violence: Not At Risk (02/21/2024)   Humiliation, Afraid, Rape, and Kick questionnaire    Fear of Current or Ex-Partner: No    Emotionally Abused: No    Physically Abused: No    Sexually Abused: No     Allergies  Allergen Reactions   Acetaminophen  Hives   Hydrocodone -Acetaminophen  Hives    acetaminophen  / hydrocodone   Pt says she can take percocet   Vicodin [Hydrocodone -Acetaminophen ] Hives    Pt says she can take percocet   Prednisone  Other (See Comments), Rash and  Swelling    Pt states that this medication causes her legs to swell.  Pt also states that she does not have any problems with the IV form of this medication.  prednisone      Outpatient Medications Prior to Visit  Medication Sig Dispense Refill   albuterol  (VENTOLIN  HFA) 108 (90 Base) MCG/ACT inhaler Inhale 2 puffs into the lungs every 6 (six) hours as needed for wheezing or shortness of breath.     budesonide -formoterol  (SYMBICORT ) 80-4.5 MCG/ACT inhaler Inhale 2 puffs into the lungs in the morning and at bedtime. 30.6 g 0   hydrochlorothiazide  (HYDRODIURIL ) 12.5 MG tablet Take 1 tablet by mouth daily.     ipratropium-albuterol  (DUONEB) 0.5-2.5 (3) MG/3ML SOLN Inhale 3 mLs into the lungs every 6 (six) hours as needed. 360 mL 0   Oxycodone  HCl 10 MG TABS Take 10 mg by mouth 3 (three) times daily as needed (Pain).     Vitamin D , Ergocalciferol , (DRISDOL ) 1.25 MG (50000 UNIT)  CAPS capsule Take 1 capsule by mouth once a week.     No facility-administered medications prior to visit.    ROS    Objective:  There were no vitals filed for this visit.   Physical Exam    CBC    Component Value Date/Time   WBC 8.1 02/22/2024 0527   RBC 4.13 02/22/2024 0527   HGB 11.2 (L) 02/22/2024 0527   HCT 36.1 02/22/2024 0527   PLT 417 (H) 02/22/2024 0527   MCV 87.4 02/22/2024 0527   MCH 27.1 02/22/2024 0527   MCHC 31.0 02/22/2024 0527   RDW 15.4 02/22/2024 0527   LYMPHSABS 4.0 01/29/2024 1508   MONOABS 0.5 01/29/2024 1508   EOSABS 0.5 01/29/2024 1508   BASOSABS 0.1 01/29/2024 1508      Latest Ref Rng & Units 02/22/2024    5:27 AM 02/21/2024   10:52 AM 02/20/2024   12:06 AM  BMP  Glucose 70 - 99 mg/dL 295   621   BUN 6 - 20 mg/dL 10   8   Creatinine 3.08 - 1.00 mg/dL 6.57  8.46  9.62   Sodium 135 - 145 mmol/L 136   133   Potassium 3.5 - 5.1 mmol/L 4.4   3.3   Chloride 98 - 111 mmol/L 102   104   CO2 22 - 32 mmol/L 22   17   Calcium 8.9 - 10.3 mg/dL 9.4   9.0    Chest imaging: CXR 02/21/24 The heart size and mediastinal contours are within normal limits. Both lungs are clear. The visualized skeletal structures are unremarkable.  PFT:     No data to display          Labs:  Path:  Echo:  Heart Catheterization:     Assessment & Plan:   No diagnosis found.  Discussion: ***    Current Outpatient Medications:    albuterol  (VENTOLIN  HFA) 108 (90 Base) MCG/ACT inhaler, Inhale 2 puffs into the lungs every 6 (six) hours as needed for wheezing or shortness of breath., Disp: , Rfl:    budesonide -formoterol  (SYMBICORT ) 80-4.5 MCG/ACT inhaler, Inhale 2 puffs into the lungs in the morning and at bedtime., Disp: 30.6 g, Rfl: 0   hydrochlorothiazide  (HYDRODIURIL ) 12.5 MG tablet, Take 1 tablet by mouth daily., Disp: , Rfl:    ipratropium-albuterol  (DUONEB) 0.5-2.5 (3) MG/3ML SOLN, Inhale 3 mLs into the lungs every 6 (six) hours as needed., Disp: 360  mL, Rfl: 0   Oxycodone  HCl 10 MG TABS, Take  10 mg by mouth 3 (three) times daily as needed (Pain)., Disp: , Rfl:    Vitamin D , Ergocalciferol , (DRISDOL ) 1.25 MG (50000 UNIT) CAPS capsule, Take 1 capsule by mouth once a week., Disp: , Rfl:

## 2024-05-31 ENCOUNTER — Encounter: Payer: Self-pay | Admitting: Podiatry

## 2024-05-31 ENCOUNTER — Ambulatory Visit (INDEPENDENT_AMBULATORY_CARE_PROVIDER_SITE_OTHER): Admitting: Podiatry

## 2024-05-31 VITALS — Ht 60.0 in | Wt 183.0 lb

## 2024-05-31 DIAGNOSIS — M79675 Pain in left toe(s): Secondary | ICD-10-CM | POA: Diagnosis not present

## 2024-05-31 DIAGNOSIS — B351 Tinea unguium: Secondary | ICD-10-CM | POA: Diagnosis not present

## 2024-05-31 DIAGNOSIS — M79674 Pain in right toe(s): Secondary | ICD-10-CM

## 2024-05-31 MED ORDER — CICLOPIROX 8 % EX SOLN: Freq: Every day | CUTANEOUS | 12 refills | Status: AC

## 2024-05-31 NOTE — Progress Notes (Unsigned)
 Chief Complaint  Patient presents with   Nail Problem     I have seen Dr. Odette Benjamin in the past and he thought I may need some terbinafine for my nails and I could not tell a difference with that, I had went to my PCP recently and they thought it may be athletes foot and treated for that with no change     HPI: 48 y.o. female presents today with concern for nail fungus affecting primarily the bilateral first toenails.  She has previously tried terbinafine which caused a reaction.  She believes this initially started as athletes foot with some peeling skin about the toes.  Has been going on for several years now.  Past Medical History:  Diagnosis Date   Acute bronchitis    Anemia    Asthma    Bilateral ovarian cysts    Bipolar 1 disorder, depressed, mild (HCC)    COVID-19 12/2020   Fibroid    GERD (gastroesophageal reflux disease)    History of blood transfusion 1994   during delivery   HTN (hypertension)    Migraines    Pregnancy induced hypertension    Seizures (HCC)    during labor just that one time    Past Surgical History:  Procedure Laterality Date   CESAREAN SECTION     LAPAROTOMY N/A 05/05/2016   Procedure: EXPLORATORY LAPAROTOMY WITH ULTRASOUND;  Surgeon: Vernal Gold, MD;  Location: [facility];  Service: Gynecology;  Laterality: N/A;  needs ultrasound ...Aaron AasAaron AasAaron Aaslysis of adhesions    MYOMECTOMY N/A 05/05/2016   Procedure: MYOMECTOMY, Biopsy Right Ovary & Peritoneum;  Surgeon: Vernal Gold, MD;  Location: [facility];  Service: Gynecology;  Laterality: N/A;   TUBAL LIGATION      Allergies  Allergen Reactions   Acetaminophen  Hives   Hydrocodone -Acetaminophen  Hives    acetaminophen  / hydrocodone   Pt says she can take percocet   Vicodin [Hydrocodone -Acetaminophen ] Hives    Pt says she can take percocet   Prednisone  Other (See Comments), Rash and Swelling    Pt states that this medication causes her legs to swell.  Pt also states that she does not have any problems with the IV  form of this medication.  prednisone     ROS denies any nausea, vomiting, fever, chills, chest pain, shortness of breath   Physical Exam: There were no vitals filed for this visit.  General: The patient is alert and oriented x3 in no acute distress.  Dermatology: Skin is warm, dry and supple bilateral lower extremities. Interspaces are clear of maceration and debris.  Bilateral first toenails there is some yellow discoloration with some nail thickening, subungual debris and dystrophic features.  Examination limited to the remaining toenails due to nail polish however there does appear to be some nail thickening.  Vascular: Palpable pedal pulses bilaterally. Capillary refill within normal limits.  No appreciable edema.  No erythema or calor.  Neurological: Light touch sensation grossly intact bilateral feet.   Musculoskeletal Exam: No pedal deformities noted    Assessment/Plan of Care: 1. Pain due to onychomycosis of toenails of both feet      Meds ordered this encounter  Medications   ciclopirox (PENLAC) 8 % solution    Sig: Apply topically at bedtime. Apply over nail and surrounding skin. Apply daily over previous coat. After seven (7) days, may remove with alcohol and continue cycle.    Dispense:  6.6 mL    Refill:  12   None  Discussed clinical  findings with patient today.  Nail specimen obtained for fungal pathology from first toenails.  Did discuss treatment options with patient.  She would like to start topical medication.  Prescription sent in for Penlac.  Discussed proper application of the medication.  Follow-up in 3 months.   Tywaun Hiltner L. Lunda Salines, AACFAS [facility]     [address] [place], [place] [address]                Office [phone]  Fax [phone]

## 2024-06-12 ENCOUNTER — Telehealth: Payer: Self-pay | Admitting: *Deleted

## 2024-06-12 NOTE — Telephone Encounter (Signed)
 Patient left message on billing line-  Requesting results of nail clippings from last visit

## 2024-07-15 ENCOUNTER — Ambulatory Visit: Payer: Self-pay | Admitting: Podiatry

## 2024-09-06 ENCOUNTER — Ambulatory Visit: Admitting: Podiatry

## 2025-01-23 ENCOUNTER — Other Ambulatory Visit: Payer: Self-pay | Admitting: Medical Genetics

## 2025-02-03 ENCOUNTER — Other Ambulatory Visit (HOSPITAL_COMMUNITY)
